# Patient Record
Sex: Female | Born: 1995 | Hispanic: No | Smoking: Current every day smoker
Health system: Southern US, Community
[De-identification: ages and names within clinical notes are randomized; demographics above are authoritative.]

## PROBLEM LIST (undated history)

## (undated) ENCOUNTER — Inpatient Hospital Stay (HOSPITAL_COMMUNITY): Payer: Self-pay

## (undated) ENCOUNTER — Inpatient Hospital Stay: Payer: Self-pay

## (undated) DIAGNOSIS — N390 Urinary tract infection, site not specified: Secondary | ICD-10-CM

## (undated) DIAGNOSIS — A749 Chlamydial infection, unspecified: Secondary | ICD-10-CM

## (undated) DIAGNOSIS — F32A Depression, unspecified: Secondary | ICD-10-CM

## (undated) DIAGNOSIS — F329 Major depressive disorder, single episode, unspecified: Secondary | ICD-10-CM

## (undated) DIAGNOSIS — F909 Attention-deficit hyperactivity disorder, unspecified type: Secondary | ICD-10-CM

## (undated) HISTORY — DX: Attention-deficit hyperactivity disorder, unspecified type: F90.9

## (undated) HISTORY — PX: TYMPANOSTOMY TUBE PLACEMENT: SHX32

## (undated) HISTORY — PX: WISDOM TOOTH EXTRACTION: SHX21

---
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
## 2017-11-04 ENCOUNTER — Ambulatory Visit (INDEPENDENT_AMBULATORY_CARE_PROVIDER_SITE_OTHER): Payer: Medicaid Other | Admitting: Certified Nurse Midwife

## 2017-11-04 ENCOUNTER — Other Ambulatory Visit (INDEPENDENT_AMBULATORY_CARE_PROVIDER_SITE_OTHER): Payer: Medicaid Other

## 2017-11-04 ENCOUNTER — Other Ambulatory Visit: Payer: Self-pay | Admitting: Certified Nurse Midwife

## 2017-11-04 ENCOUNTER — Encounter: Payer: Self-pay | Admitting: Certified Nurse Midwife

## 2017-11-04 VITALS — BP 116/72 | HR 96 | Ht 60.0 in | Wt 190.1 lb

## 2017-11-04 DIAGNOSIS — N8312 Corpus luteum cyst of left ovary: Secondary | ICD-10-CM

## 2017-11-04 DIAGNOSIS — N912 Amenorrhea, unspecified: Secondary | ICD-10-CM

## 2017-11-04 DIAGNOSIS — Z3201 Encounter for pregnancy test, result positive: Secondary | ICD-10-CM | POA: Diagnosis not present

## 2017-11-04 DIAGNOSIS — Z3A01 Less than 8 weeks gestation of pregnancy: Secondary | ICD-10-CM | POA: Diagnosis not present

## 2017-11-04 DIAGNOSIS — O3411 Maternal care for benign tumor of corpus uteri, first trimester: Secondary | ICD-10-CM | POA: Diagnosis not present

## 2017-11-04 DIAGNOSIS — N926 Irregular menstruation, unspecified: Secondary | ICD-10-CM

## 2017-11-04 LAB — POCT URINE PREGNANCY: Preg Test, Ur: POSITIVE — AB

## 2017-11-04 NOTE — Progress Notes (Signed)
Subjective:    Christina Warner is a 22 y.o. female who presents for evaluation of amenorrhea. She believes she could be pregnant. Pregnancy is desired. Sexual Activity: single partner, contraception: none. Current symptoms also include: nausea. Last period was normal.   Patient's last menstrual period was 09/21/2017 (exact date). The following portions of the patient's history were reviewed and updated as appropriate: allergies, current medications, past family history, past medical history, past social history, past surgical history and problem list.  Review of Systems Pertinent items are noted in HPI.     Objective:    BP 116/72   Pulse 96   Ht 5' (1.524 m)   Wt 190 lb 1 oz (86.2 kg)   LMP 09/21/2017 (Exact Date)   Breastfeeding? No   BMI 37.12 kg/m  General: alert, cooperative, appears stated age and no acute distress    Lab Review Urine HCG: positive    Assessment:    Absence of menstruation.     Plan:    Pregnancy Test: Positive. Briefly discussed pre-natal care options. Pregnancy, Childbirth and the Newborn care.MDvs midwifery care.  Encouraged well-balanced diet, plenty of rest when needed, pre-natal vitamins daily and walking for exercise. Discussed self-help for nausea, avoiding OTC medications until consulting provider or pharmacist, other than Tylenol as needed, minimal caffeine (1-2 cups daily) and avoiding alcohol. She will schedule her initial nurse OB visit at 10 wks and her NOV physical at 12 wks.  Christina Warner, CNM

## 2017-11-04 NOTE — Patient Instructions (Addendum)
Prenatal Care WHAT IS PRENATAL CARE? Prenatal care is the process of caring for a pregnant woman before she gives birth. Prenatal care makes sure that she and her baby remain as healthy as possible throughout pregnancy. Prenatal care may be provided by a midwife, family practice health care provider, or a childbirth and pregnancy specialist (obstetrician). Prenatal care may include physical examinations, testing, treatments, and education on nutrition, lifestyle, and social support services. WHY IS PRENATAL CARE SO IMPORTANT? Early and consistent prenatal care increases the chance that you and your baby will remain healthy throughout your pregnancy. This type of care also decreases a baby's risk of being born too early (prematurely), or being born smaller than expected (small for gestational age). Any underlying medical conditions you may have that could pose a risk during your pregnancy are discussed during prenatal care visits. You will also be monitored regularly for any new conditions that may arise during your pregnancy so they can be treated quickly and effectively. WHAT HAPPENS DURING PRENATAL CARE VISITS? Prenatal care visits may include the following: Discussion Tell your health care provider about any new signs or symptoms you have experienced since your last visit. These might include:  Nausea or vomiting.  Increased or decreased level of energy.  Difficulty sleeping.  Back or leg pain.  Weight changes.  Frequent urination.  Shortness of breath with physical activity.  Changes in your skin, such as the development of a rash or itchiness.  Vaginal discharge or bleeding.  Feelings of excitement or nervousness.  Changes in your baby's movements.  You may want to write down any questions or topics you want to discuss with your health care provider and bring them with you to your appointment. Examination During your first prenatal care visit, you will likely have a complete  physical exam. Your health care provider will often examine your vagina, cervix, and the position of your uterus, as well as check your heart, lungs, and other body systems. As your pregnancy progresses, your health care provider will measure the size of your uterus and your baby's position inside your uterus. He or she may also examine you for early signs of labor. Your prenatal visits may also include checking your blood pressure and, after about 10-12 weeks of pregnancy, listening to your baby's heartbeat. Testing Regular testing often includes:  Urinalysis. This checks your urine for glucose, protein, or signs of infection.  Blood count. This checks the levels of white and red blood cells in your body.  Tests for sexually transmitted infections (STIs). Testing for STIs at the beginning of pregnancy is routinely done and is required in many states.  Antibody testing. You will be checked to see if you are immune to certain illnesses, such as rubella, that can affect a developing fetus.  Glucose screen. Around 24-28 weeks of pregnancy, your blood glucose level will be checked for signs of gestational diabetes. Follow-up tests may be recommended.  Group B strep. This is a bacteria that is commonly found inside a woman's vagina. This test will inform your health care provider if you need an antibiotic to reduce the amount of this bacteria in your body prior to labor and childbirth.  Ultrasound. Many pregnant women undergo an ultrasound screening around 18-20 weeks of pregnancy to evaluate the health of the fetus and check for any developmental abnormalities.  HIV (human immunodeficiency virus) testing. Early in your pregnancy, you will be screened for HIV. If you are at high risk for HIV, this test may   be repeated during your third trimester of pregnancy.  You may be offered other testing based on your age, personal or family medical history, or other factors. HOW OFTEN SHOULD I PLAN TO SEE MY  HEALTH CARE PROVIDER FOR PRENATAL CARE? Your prenatal care check-up schedule depends on any medical conditions you have before, or develop during, your pregnancy. If you do not have any underlying medical conditions, you will likely be seen for checkups:  Monthly, during the first 6 months of pregnancy.  Twice a month during months 7 and 8 of pregnancy.  Weekly starting in the 9th month of pregnancy and until delivery.  If you develop signs of early labor or other concerning signs or symptoms, you may need to see your health care provider more often. Ask your health care provider what prenatal care schedule is best for you. WHAT CAN I DO TO KEEP MYSELF AND MY BABY AS HEALTHY AS POSSIBLE DURING MY PREGNANCY?  Take a prenatal vitamin containing 400 micrograms (0.4 mg) of folic acid every day. Your health care provider may also ask you to take additional vitamins such as iodine, vitamin D, iron, copper, and zinc.  Take 1500-2000 mg of calcium daily starting at your 20th week of pregnancy until you deliver your baby.  Make sure you are up to date on your vaccinations. Unless directed otherwise by your health care provider: ? You should receive a tetanus, diphtheria, and pertussis (Tdap) vaccination between the 27th and 36th week of your pregnancy, regardless of when your last Tdap immunization occurred. This helps protect your baby from whooping cough (pertussis) after he or she is born. ? You should receive an annual inactivated influenza vaccine (IIV) to help protect you and your baby from influenza. This can be done at any point during your pregnancy.  Eat a well-rounded diet that includes: ? Fresh fruits and vegetables. ? Lean proteins. ? Calcium-rich foods such as milk, yogurt, hard cheeses, and dark, leafy greens. ? Whole grain breads.  Do noteat seafood high in mercury, including: ? Swordfish. ? Tilefish. ? Shark. ? King mackerel. ? More than 6 oz tuna per week.  Do not  eat: ? Raw or undercooked meats or eggs. ? Unpasteurized foods, such as soft cheeses (brie, blue, or feta), juices, and milks. ? Lunch meats. ? Hot dogs that have not been heated until they are steaming.  Drink enough water to keep your urine clear or pale yellow. For many women, this may be 10 or more 8 oz glasses of water each day. Keeping yourself hydrated helps deliver nutrients to your baby and may prevent the start of pre-term uterine contractions.  Do not use any tobacco products including cigarettes, chewing tobacco, or electronic cigarettes. If you need help quitting, ask your health care provider.  Do not drink beverages containing alcohol. No safe level of alcohol consumption during pregnancy has been determined.  Do not use any illegal drugs. These can harm your developing baby or cause a miscarriage.  Ask your health care provider or pharmacist before taking any prescription or over-the-counter medicines, herbs, or supplements.  Limit your caffeine intake to no more than 200 mg per day.  Exercise. Unless told otherwise by your health care provider, try to get 30 minutes of moderate exercise most days of the week. Do not  do high-impact activities, contact sports, or activities with a high risk of falling, such as horseback riding or downhill skiing.  Get plenty of rest.  Avoid anything that raises your  body temperature, such as hot tubs and saunas.  If you own a cat, do not empty its litter box. Bacteria contained in cat feces can cause an infection called toxoplasmosis. This can result in serious harm to the fetus.  Stay away from chemicals such as insecticides, lead, mercury, and cleaning or paint products that contain solvents.  Do not have any X-rays taken unless medically necessary.  Take a childbirth and breastfeeding preparation class. Ask your health care provider if you need a referral or recommendation.  This information is not intended to replace advice given  to you by your health care provider. Make sure you discuss any questions you have with your health care provider. Document Released: 04/03/2003 Document Revised: 09/03/2015 Document Reviewed: 06/15/2013 Elsevier Interactive Patient Education  2017 [employer] Common Medications Safe in Pregnancy  Acne:      Constipation:  Benzoyl Peroxide     Colace  Clindamycin      Dulcolax Suppository  Topica Erythromycin     Fibercon  Salicylic Acid      Metamucil         Miralax AVOID:        Senakot   Accutane    Cough:  Retin-A       Cough Drops  Tetracycline      Phenergan w/ Codeine if Rx  Minocycline      Robitussin (Plain & DM)  Antibiotics:     Crabs/Lice:  Ceclor       RID  Cephalosporins    AVOID:  E-Mycins      Kwell  Keflex  Macrobid/Macrodantin   Diarrhea:  Penicillin      Kao-Pectate  Zithromax      Imodium AD         PUSH FLUIDS AVOID:       Cipro     Fever:  Tetracycline      Tylenol (Regular or Extra  Minocycline       Strength)  Levaquin      Extra Strength-Do not          Exceed 8 tabs/24 hrs Caffeine:        <200mg/day (equiv. To 1 cup of coffee or  approx. 3 12 oz sodas)         Gas: Cold/Hayfever:       Gas-X  Benadryl      Mylicon  Claritin       Phazyme  **Claritin-D        Chlor-Trimeton    Headaches:  Dimetapp      ASA-Free Excedrin  Drixoral-Non-Drowsy     Cold Compress  Mucinex (Guaifenasin)     Tylenol (Regular or Extra  Sudafed/Sudafed-12 Hour     Strength)  **Sudafed PE Pseudoephedrine   Tylenol Cold & Sinus     Vicks Vapor Rub  Zyrtec  **AVOID if Problems With Blood Pressure         Heartburn: Avoid lying down for at least 1 hour after meals  Aciphex      Maalox     Rash:  Milk of Magnesia     Benadryl    Mylanta       1% Hydrocortisone Cream  Pepcid  Pepcid Complete   Sleep Aids:  Prevacid      Ambien   Prilosec       Benadryl  Rolaids       Chamomile Tea  Tums (Limit 4/day)     Unisom  Zantac         Tylenol PM         Warm  milk-add vanilla or  Hemorrhoids:       Sugar for taste  Anusol/Anusol H.C.  (RX: Analapram 2.5%)  Sugar Substitutes:  Hydrocortisone OTC     Ok in moderation  Preparation H      Tucks        Vaseline lotion applied to tissue with wiping    Herpes:     Throat:  Acyclovir      Oragel  Famvir  Valtrex     Vaccines:         Flu Shot Leg Cramps:       *Gardasil  Benadryl      Hepatitis A         Hepatitis B Nasal Spray:       Pneumovax  Saline Nasal Spray     Polio Booster         Tetanus Nausea:       Tuberculosis test or PPD  Vitamin B6 25 mg TID   AVOID:    Dramamine      *Gardasil  Emetrol       Live Poliovirus  Ginger Root 250 mg QID    MMR (measles, mumps &  High Complex Carbs @ Bedtime    rebella)  Sea Bands-Accupressure    Varicella (Chickenpox)  Unisom 1/2 tab TID     *No known complications           If received before Pain:         Known pregnancy;   Darvocet       Resume series after  Lortab        Delivery  Percocet    Yeast:   Tramadol      Femstat  Tylenol 3      Gyne-lotrimin  Ultram       Monistat  Vicodin           MISC:         All Sunscreens           Hair Coloring/highlights          Insect Repellant's          (Including DEET)         Mystic Tans  

## 2017-11-04 NOTE — Progress Notes (Signed)
New pt is here for a confirmation of pregnancy. LMP 09/21/17. UPT positive. Pt had U/S to confirm dates. Unable to visualize. AT was called to hosp. Pt was instructed to make followup appointments- 4 weeks for intake and 6 weeks for NOB PE.

## 2017-11-16 ENCOUNTER — Encounter: Payer: Self-pay | Admitting: Certified Nurse Midwife

## 2017-11-30 ENCOUNTER — Ambulatory Visit (INDEPENDENT_AMBULATORY_CARE_PROVIDER_SITE_OTHER): Payer: Medicaid Other | Admitting: Certified Nurse Midwife

## 2017-11-30 VITALS — BP 106/67 | HR 87 | Ht 60.0 in | Wt 190.9 lb

## 2017-11-30 DIAGNOSIS — R638 Other symptoms and signs concerning food and fluid intake: Secondary | ICD-10-CM

## 2017-11-30 DIAGNOSIS — Z720 Tobacco use: Secondary | ICD-10-CM

## 2017-11-30 DIAGNOSIS — O219 Vomiting of pregnancy, unspecified: Secondary | ICD-10-CM

## 2017-11-30 DIAGNOSIS — K5900 Constipation, unspecified: Secondary | ICD-10-CM

## 2017-11-30 DIAGNOSIS — F17210 Nicotine dependence, cigarettes, uncomplicated: Secondary | ICD-10-CM

## 2017-11-30 DIAGNOSIS — T7589XA Other specified effects of external causes, initial encounter: Secondary | ICD-10-CM

## 2017-11-30 DIAGNOSIS — Z202 Contact with and (suspected) exposure to infections with a predominantly sexual mode of transmission: Secondary | ICD-10-CM

## 2017-11-30 DIAGNOSIS — Z3401 Encounter for supervision of normal first pregnancy, first trimester: Secondary | ICD-10-CM

## 2017-11-30 MED ORDER — DOXYLAMINE-PYRIDOXINE 10-10 MG PO TBEC: 10.0000 mg | DELAYED_RELEASE_TABLET | Freq: Every day | ORAL | 1 refills | Status: DC

## 2017-11-30 NOTE — Progress Notes (Signed)
Oren Sectiononi N Elison presents for NOB nurse interview visit. Pregnancy confirmation done _7/24/2019 with AT.  G2- .  P1001-.  LMP-09/21/2017( exact) EDD- 06/28/2018 - 10 weeks. Pregnancy education material explained and given. Pos for  cats in the home. Advised to not change the litter box. Toxo ordered. NOB labs ordered. TSH/HbgA1c ordered due to Increased BMI-Body mass index is 37.28 kg/m.   HIV labs and Drug screen were explained and ordered.   PNV encouraged.  Pos for n/v. Diclegis rx given. Pos for tobacco use. Advised  against smoking.  Pt c/o of constipation. Encouraged 64oz of water qd. Stool softeners bid. FMLA formed signed. Financial policy reviewed.   Genetic screening options discussed. Genetic testing: Unsure.  Pt may discuss with provider. Pt. To follow up with provider in _2_ weeks for NOB physical.  All questions answered.

## 2017-12-01 ENCOUNTER — Other Ambulatory Visit: Payer: Self-pay

## 2017-12-01 LAB — DRUG PROFILE, UR, 9 DRUGS (LABCORP)
Amphetamines, Urine: NEGATIVE ng/mL
Barbiturate Quant, Ur: NEGATIVE ng/mL
Benzodiazepine Quant, Ur: NEGATIVE ng/mL
CANNABINOID QUANT UR: NEGATIVE ng/mL
Cocaine (Metab.): NEGATIVE ng/mL
METHADONE SCREEN, URINE: NEGATIVE ng/mL
Opiate Quant, Ur: NEGATIVE ng/mL
PCP QUANT UR: NEGATIVE ng/mL
Propoxyphene: NEGATIVE ng/mL

## 2017-12-01 LAB — NICOTINE SCREEN, URINE: Cotinine Ql Scrn, Ur: POSITIVE ng/mL — AB

## 2017-12-01 LAB — URINALYSIS, ROUTINE W REFLEX MICROSCOPIC
Bilirubin, UA: NEGATIVE
GLUCOSE, UA: NEGATIVE
Nitrite, UA: NEGATIVE
RBC, UA: NEGATIVE
Specific Gravity, UA: 1.028 (ref 1.005–1.030)
Urobilinogen, Ur: 1 mg/dL (ref 0.2–1.0)
pH, UA: 6 (ref 5.0–7.5)

## 2017-12-01 LAB — MICROSCOPIC EXAMINATION: Casts: NONE SEEN /lpf

## 2017-12-01 MED ORDER — DOXYLAMINE-PYRIDOXINE 10-10 MG PO TBEC: 10.0000 mg | DELAYED_RELEASE_TABLET | Freq: Three times a day (TID) | ORAL | 1 refills | Status: DC

## 2017-12-02 ENCOUNTER — Other Ambulatory Visit: Payer: Self-pay

## 2017-12-02 LAB — URINE CULTURE, OB REFLEX

## 2017-12-02 LAB — GC/CHLAMYDIA PROBE AMP
Chlamydia trachomatis, NAA: NEGATIVE
NEISSERIA GONORRHOEAE BY PCR: NEGATIVE

## 2017-12-02 LAB — CULTURE, OB URINE

## 2017-12-02 MED ORDER — DOXYLAMINE-PYRIDOXINE 10-10 MG PO TBEC: 10.0000 mg | DELAYED_RELEASE_TABLET | Freq: Three times a day (TID) | ORAL | 1 refills | Status: DC

## 2017-12-05 LAB — TOXOPLASMA ANTIBODIES- IGG AND  IGM
TOXOPLASMA IGG RATIO: 249 [IU]/mL — AB (ref 0.0–7.1)
Toxoplasma Antibody- IgM: <3 AU/mL (ref 0.0–7.9)

## 2017-12-05 LAB — CBC WITH DIFFERENTIAL/PLATELET
BASOS ABS: 0 10*3/uL (ref 0.0–0.2)
Basos: 0 %
EOS (ABSOLUTE): 0.1 10*3/uL (ref 0.0–0.4)
Eos: 1 %
Hematocrit: 37.4 % (ref 34.0–46.6)
Hemoglobin: 12.4 g/dL (ref 11.1–15.9)
Immature Grans (Abs): 0 10*3/uL (ref 0.0–0.1)
Immature Granulocytes: 0 %
Lymphocytes Absolute: 2.6 10*3/uL (ref 0.7–3.1)
Lymphs: 29 %
MCH: 29.4 pg (ref 26.6–33.0)
MCHC: 33.2 g/dL (ref 31.5–35.7)
MCV: 89 fL (ref 79–97)
Monocytes Absolute: 0.4 10*3/uL (ref 0.1–0.9)
Monocytes: 5 %
NEUTROS ABS: 5.8 10*3/uL (ref 1.4–7.0)
Neutrophils: 65 %
PLATELETS: 243 10*3/uL (ref 150–450)
RBC: 4.22 x10E6/uL (ref 3.77–5.28)
RDW: 12.1 % — ABNORMAL LOW (ref 12.3–15.4)
WBC: 9 10*3/uL (ref 3.4–10.8)

## 2017-12-05 LAB — ABO AND RH: RH TYPE: POSITIVE

## 2017-12-05 LAB — HEMOGLOBIN A1C
ESTIMATED AVERAGE GLUCOSE: 100 mg/dL
Hgb A1c MFr Bld: 5.1 % (ref 4.8–5.6)

## 2017-12-05 LAB — RPR: RPR Ser Ql: NONREACTIVE

## 2017-12-05 LAB — HIV ANTIBODY (ROUTINE TESTING W REFLEX): HIV Screen 4th Generation wRfx: NONREACTIVE

## 2017-12-05 LAB — RUBELLA SCREEN: Rubella Antibodies, IGG: 10 index (ref >0.99)

## 2017-12-05 LAB — TSH: TSH: 0.987 u[IU]/mL (ref 0.450–4.500)

## 2017-12-05 LAB — HEPATITIS B SURFACE ANTIGEN: HEP B S AG: NEGATIVE

## 2017-12-05 LAB — ANTIBODY SCREEN: ANTIBODY SCREEN: NEGATIVE

## 2017-12-05 LAB — VARICELLA ZOSTER ANTIBODY, IGG: Varicella zoster IgG: 816 index (ref >165)

## 2017-12-15 ENCOUNTER — Ambulatory Visit (INDEPENDENT_AMBULATORY_CARE_PROVIDER_SITE_OTHER): Payer: Self-pay | Admitting: Certified Nurse Midwife

## 2017-12-15 ENCOUNTER — Encounter: Payer: Self-pay | Admitting: Certified Nurse Midwife

## 2017-12-15 VITALS — BP 102/70 | HR 88 | Wt 187.5 lb

## 2017-12-15 DIAGNOSIS — O09291 Supervision of pregnancy with other poor reproductive or obstetric history, first trimester: Secondary | ICD-10-CM

## 2017-12-15 DIAGNOSIS — O09299 Supervision of pregnancy with other poor reproductive or obstetric history, unspecified trimester: Secondary | ICD-10-CM | POA: Insufficient documentation

## 2017-12-15 DIAGNOSIS — Z3A12 12 weeks gestation of pregnancy: Secondary | ICD-10-CM

## 2017-12-15 DIAGNOSIS — Z3401 Encounter for supervision of normal first pregnancy, first trimester: Secondary | ICD-10-CM

## 2017-12-15 LAB — POCT URINALYSIS DIPSTICK OB
Bilirubin, UA: NEGATIVE
GLUCOSE, UA: NEGATIVE
Ketones, UA: NEGATIVE
LEUKOCYTES UA: NEGATIVE
Nitrite, UA: NEGATIVE
POC,PROTEIN,UA: NEGATIVE
RBC UA: NEGATIVE
Spec Grav, UA: 1.025 (ref 1.010–1.025)
Urobilinogen, UA: 0.2 E.U./dL
pH, UA: 5 (ref 5.0–8.0)

## 2017-12-15 NOTE — Progress Notes (Signed)
NEW OB HISTORY AND PHYSICAL  SUBJECTIVE:       Christina Warner is a 22 y.o. G36P1001 female, Patient's last menstrual period was 09/21/2017 (exact date)., Estimated Date of Delivery: 06/28/18, [redacted]w[redacted]d, presents today for establishment of Prenatal Care. She has nausea and vomiting that the bonjesta is helping with.  History significant for forceps delivery of 1st baby.    Gynecologic History Patient's last menstrual period was 09/21/2017 (exact date). Normal Contraception: none Last Pap: n/a 22 yr old.   Obstetric History OB History  Gravida Para Term Preterm AB Living  2 1 1     1   SAB TAB Ectopic Multiple Live Births        0 1    # Outcome Date GA Lbr Len/2nd Weight Sex Delivery Anes PTL Lv  2 Current           1 Term 06/01/14 [redacted]w[redacted]d  2.98 kg F Vag-Spont EPI  LIV     Birth Comments: WNL    Past Medical History:  Diagnosis Date  . ADHD (attention deficit hyperactivity disorder)   . Asthma   . Depression     Past Surgical History:  Procedure Laterality Date  . TYMPANOSTOMY TUBE PLACEMENT      Current Outpatient Medications on File Prior to Visit  Medication Sig Dispense Refill  . Doxylamine-Pyridoxine 10-10 MG TBEC Take 10 mg by mouth 3 (three) times daily. PRN. Max dose 4 tabs per day 60 tablet 1  . Prenatal Vit-Fe Fumarate-FA (PRENATAL MULTIVITAMIN) TABS tablet Take 1 tablet by mouth daily at 12 noon.     Current Facility-Administered Medications on File Prior to Visit  Medication Dose Route Frequency Provider Last Rate Last Dose  . lidocaine (PF) (XYLOCAINE) 1 % injection    Anesthesia Intra-op Cristela Blue, MD   6 mL at 05/31/14 2144    Allergies  Allergen Reactions  . Motrin [Ibuprofen]     Social History   Socioeconomic History  . Marital status: Single    Spouse name: Not on file  . Number of children: Not on file  . Years of education: Not on file  . Highest education level: Not on file  Occupational History  . Not on file  Social Needs  . Financial  resource strain: Not on file  . Food insecurity:    Worry: Not on file    Inability: Not on file  . Transportation needs:    Medical: Not on file    Non-medical: Not on file  Tobacco Use  . Smoking status: Current Every Day Smoker    Packs/day: 0.25    Types: Cigarettes  . Smokeless tobacco: Never Used  Substance and Sexual Activity  . Alcohol use: No  . Drug use: No    Types: Marijuana  . Sexual activity: Yes    Birth control/protection: None  Lifestyle  . Physical activity:    Days per week: Not on file    Minutes per session: Not on file  . Stress: Not on file  Relationships  . Social connections:    Talks on phone: Not on file    Gets together: Not on file    Attends religious service: Not on file    Active member of club or organization: Not on file    Attends meetings of clubs or organizations: Not on file    Relationship status: Not on file  . Intimate partner violence:    Fear of current or ex partner: Not on file  Emotionally abused: Not on file    Physically abused: Not on file    Forced sexual activity: Not on file  Other Topics Concern  . Not on file  Social History Narrative  . Not on file    Family History  Problem Relation Age of Onset  . Depression Mother   . Anxiety disorder Mother   . Depression Sister     The following portions of the patient's history were reviewed and updated as appropriate: allergies, current medications, past OB history, past medical history, past surgical history, past family history, past social history, and problem list.    OBJECTIVE: Initial Physical Exam (New OB)  GENERAL APPEARANCE: alert, well appearing, in no apparent distress, oriented to person, place and time, overweight HEAD: normocephalic, atraumatic MOUTH: mucous membranes moist, pharynx normal without lesions THYROID: no thyromegaly or masses present BREASTS: no masses noted, no significant tenderness, no palpable axillary nodes, no skin  changes LUNGS: clear to auscultation, no wheezes, rales or rhonchi, symmetric air entry HEART: regular rate and rhythm, no murmurs ABDOMEN: soft, nontender, nondistended, no abnormal masses, no epigastric pain and FHT present EXTREMITIES: no redness or tenderness in the calves or thighs, no edema, no limitation in range of motion, intact peripheral pulses SKIN: normal coloration and turgor, no rashes LYMPH NODES: no adenopathy palpable NEUROLOGIC: alert, oriented, normal speech, no focal findings or movement disorder noted  PELVIC EXAM EXTERNAL GENITALIA: normal appearing vulva with no masses, tenderness or lesions VAGINA: no abnormal discharge or lesions CERVIX: no lesions or cervical motion tenderness UTERUS: gravid ADNEXA: no masses palpable and nontender OB EXAM PELVIMETRY: appears adequate RECTUM: exam not indicated  ASSESSMENT: Normal pregnancy  PLAN: New OB counseling: The patient has been given an overview regarding routine prenatal care. Recommendations regarding diet, weight gain, and exercise in pregnancy were given. Prenatal testing, optional genetic testing, and ultrasound use in pregnancy were reviewed. Declines genetic testing.Encouraged patient to stop smoking. Benefits of Breast Feeding were discussed. The patient is encouraged to consider nursing her baby post partum.    Doreene Burke, CNM

## 2017-12-15 NOTE — Patient Instructions (Signed)

## 2017-12-17 ENCOUNTER — Other Ambulatory Visit: Payer: Self-pay

## 2017-12-17 MED ORDER — DICLEGIS 10-10 MG PO TBEC: 2.0000 | DELAYED_RELEASE_TABLET | Freq: Every day | ORAL | 5 refills | Status: DC

## 2017-12-23 ENCOUNTER — Telehealth: Payer: Self-pay | Admitting: Obstetrics and Gynecology

## 2017-12-23 ENCOUNTER — Other Ambulatory Visit: Payer: Self-pay | Admitting: *Deleted

## 2017-12-23 MED ORDER — DICLEGIS 10-10 MG PO TBEC: 2.0000 | DELAYED_RELEASE_TABLET | Freq: Three times a day (TID) | ORAL | 5 refills | Status: DC

## 2017-12-23 NOTE — Telephone Encounter (Signed)
Sent pt in diclegis, DAW, they should pay for it

## 2017-12-23 NOTE — Telephone Encounter (Signed)
The patient is requesting something different for her nausea due to the one she is on now being $400, and she is also wanting to change her pharmacy [facility] [place] on [address].  Please advise, thanks.

## 2017-12-24 ENCOUNTER — Other Ambulatory Visit: Payer: Self-pay

## 2017-12-24 MED ORDER — DICLEGIS 10-10 MG PO TBEC: 1.0000 | DELAYED_RELEASE_TABLET | Freq: Three times a day (TID) | ORAL | 5 refills | Status: DC

## 2017-12-30 NOTE — Telephone Encounter (Signed)
CM spoke with patient after being informed by [employer] that patient's Medicaid [facility].  CM encouraged patient that her Rx for Diclegis will not be filled due to her current Medicaid status.  CM encouraged patient to contact [place] DSS [place] and notify them of her pregnancy with an estimated due date.  Patient agreeable and confirmed with cm that she will notify her DSS worker of her pregnancy and estimated due date.  CM encouraged pt to contact our office when she has completed the transaction with her DSS worker so that her Rx for Diclegis can be resubmitted to a pharmacy of her choice.  Notified cm that preferred pharmacy is [facility] in [place], [place] on [address].

## 2018-01-12 ENCOUNTER — Encounter: Payer: Self-pay | Admitting: Obstetrics and Gynecology

## 2018-01-12 ENCOUNTER — Ambulatory Visit (INDEPENDENT_AMBULATORY_CARE_PROVIDER_SITE_OTHER): Payer: Medicaid Other | Admitting: Obstetrics and Gynecology

## 2018-01-12 VITALS — BP 102/65 | HR 80 | Wt 186.5 lb

## 2018-01-12 DIAGNOSIS — Z3A16 16 weeks gestation of pregnancy: Secondary | ICD-10-CM

## 2018-01-12 DIAGNOSIS — Z23 Encounter for immunization: Secondary | ICD-10-CM | POA: Diagnosis not present

## 2018-01-12 DIAGNOSIS — Z3492 Encounter for supervision of normal pregnancy, unspecified, second trimester: Secondary | ICD-10-CM

## 2018-01-12 DIAGNOSIS — Z3482 Encounter for supervision of other normal pregnancy, second trimester: Secondary | ICD-10-CM | POA: Diagnosis not present

## 2018-01-12 LAB — POCT URINALYSIS DIPSTICK OB
BILIRUBIN UA: NEGATIVE
Blood, UA: NEGATIVE
KETONES UA: NEGATIVE
Nitrite, UA: NEGATIVE
Spec Grav, UA: 1.015 (ref 1.010–1.025)
UROBILINOGEN UA: 0.2 U/dL
pH, UA: 7 (ref 5.0–8.0)

## 2018-01-12 NOTE — Progress Notes (Signed)
ROB- flu vaccine given,PMH form fillied out, will do early glucola with anatomy scan. Stopped smoking x 3 weeks ago.

## 2018-01-12 NOTE — Progress Notes (Signed)
ROB- pt is doing well, Rash on R foot, flu vaccine given

## 2018-01-28 ENCOUNTER — Other Ambulatory Visit: Payer: Self-pay | Admitting: Obstetrics and Gynecology

## 2018-01-28 DIAGNOSIS — Z3689 Encounter for other specified antenatal screening: Secondary | ICD-10-CM

## 2018-02-11 ENCOUNTER — Ambulatory Visit (INDEPENDENT_AMBULATORY_CARE_PROVIDER_SITE_OTHER): Payer: Medicaid Other | Admitting: Certified Nurse Midwife

## 2018-02-11 ENCOUNTER — Ambulatory Visit: Payer: Medicaid Other

## 2018-02-11 ENCOUNTER — Ambulatory Visit (INDEPENDENT_AMBULATORY_CARE_PROVIDER_SITE_OTHER): Payer: Medicaid Other

## 2018-02-11 VITALS — BP 106/71 | HR 96 | Wt 189.8 lb

## 2018-02-11 DIAGNOSIS — O99212 Obesity complicating pregnancy, second trimester: Secondary | ICD-10-CM

## 2018-02-11 DIAGNOSIS — Z3689 Encounter for other specified antenatal screening: Secondary | ICD-10-CM | POA: Diagnosis not present

## 2018-02-11 DIAGNOSIS — Z3A18 18 weeks gestation of pregnancy: Secondary | ICD-10-CM

## 2018-02-11 DIAGNOSIS — Z3492 Encounter for supervision of normal pregnancy, unspecified, second trimester: Secondary | ICD-10-CM

## 2018-02-11 DIAGNOSIS — O99210 Obesity complicating pregnancy, unspecified trimester: Secondary | ICD-10-CM | POA: Insufficient documentation

## 2018-02-11 LAB — POCT URINALYSIS DIPSTICK OB
Bilirubin, UA: NEGATIVE
LEUKOCYTES UA: NEGATIVE
NITRITE UA: NEGATIVE
POC,PROTEIN,UA: NEGATIVE
RBC UA: NEGATIVE
SPEC GRAV UA: 1.01 (ref 1.010–1.025)
Urobilinogen, UA: 0.2 E.U./dL
pH, UA: 5 (ref 5.0–8.0)

## 2018-02-11 NOTE — Progress Notes (Signed)
ROB, no complaints.  

## 2018-02-11 NOTE — Patient Instructions (Signed)
Common Medications Safe in Pregnancy  Acne:      Constipation:  Benzoyl Peroxide     Colace  Clindamycin      Dulcolax Suppository  Topica Erythromycin     Fibercon  Salicylic Acid      Metamucil         Miralax AVOID:        Senakot   Accutane    Cough:  Retin-A       Cough Drops  Tetracycline      Phenergan w/ Codeine if Rx  Minocycline      Robitussin (Plain & DM)  Antibiotics:     Crabs/Lice:  Ceclor       RID  Cephalosporins    AVOID:  E-Mycins      Kwell  Keflex  Macrobid/Macrodantin   Diarrhea:  Penicillin      Kao-Pectate  Zithromax      Imodium AD         PUSH FLUIDS AVOID:       Cipro     Fever:  Tetracycline      Tylenol (Regular or Extra  Minocycline       Strength)  Levaquin      Extra Strength-Do not          Exceed 8 tabs/24 hrs Caffeine:        <200mg/day (equiv. To 1 cup of coffee or  approx. 3 12 oz sodas)         Gas: Cold/Hayfever:       Gas-X  Benadryl      Mylicon  Claritin       Phazyme  **Claritin-D        Chlor-Trimeton    Headaches:  Dimetapp      ASA-Free Excedrin  Drixoral-Non-Drowsy     Cold Compress  Mucinex (Guaifenasin)     Tylenol (Regular or Extra  Sudafed/Sudafed-12 Hour     Strength)  **Sudafed PE Pseudoephedrine   Tylenol Cold & Sinus     Vicks Vapor Rub  Zyrtec  **AVOID if Problems With Blood Pressure         Heartburn: Avoid lying down for at least 1 hour after meals  Aciphex      Maalox     Rash:  Milk of Magnesia     Benadryl    Mylanta       1% Hydrocortisone Cream  Pepcid  Pepcid Complete   Sleep Aids:  Prevacid      Ambien   Prilosec       Benadryl  Rolaids       Chamomile Tea  Tums (Limit 4/day)     Unisom  Zantac       Tylenol PM         Warm milk-add vanilla or  Hemorrhoids:       Sugar for taste  Anusol/Anusol H.C.  (RX: Analapram 2.5%)  Sugar Substitutes:  Hydrocortisone OTC     Ok in moderation  Preparation H      Tucks        Vaseline lotion applied to tissue with  wiping    Herpes:     Throat:  Acyclovir      Oragel  Famvir  Valtrex     Vaccines:         Flu Shot Leg Cramps:       *Gardasil  Benadryl      Hepatitis A         Hepatitis B Nasal Spray:         Pneumovax  Saline Nasal Spray     Polio Booster         Tetanus Nausea:       Tuberculosis test or PPD  Vitamin B6 25 mg TID   AVOID:    Dramamine      *Gardasil  Emetrol       Live Poliovirus  Ginger Root 250 mg QID    MMR (measles, mumps &  High Complex Carbs @ Bedtime    rebella)  Sea Bands-Accupressure    Varicella (Chickenpox)  Unisom 1/2 tab TID     *No known complications           If received before Pain:         Known pregnancy;   Darvocet       Resume series after  Lortab        Delivery  Percocet    Yeast:   Tramadol      Femstat  Tylenol 3      Gyne-lotrimin  Ultram       Monistat  Vicodin           MISC:         All Sunscreens           Hair Coloring/highlights          Insect Repellant's          (Including DEET)         Mystic Tans Second Trimester of Pregnancy The second trimester is from week 13 through week 28, month 4 through 6. This is often the time in pregnancy that you feel your best. Often times, morning sickness has lessened or quit. You may have more energy, and you may get hungry more often. Your unborn baby (fetus) is growing rapidly. At the end of the sixth month, he or she is about 9 inches long and weighs about 1 pounds. You will likely feel the baby move (quickening) between 18 and 20 weeks of pregnancy. Follow these instructions at home:  Avoid all smoking, herbs, and alcohol. Avoid drugs not approved by your doctor.  Do not use any tobacco products, including cigarettes, chewing tobacco, and electronic cigarettes. If you need help quitting, ask your doctor. You may get counseling or other support to help you quit.  Only take medicine as told by your doctor. Some medicines are safe and some are not during pregnancy.  Exercise only as told by your  doctor. Stop exercising if you start having cramps.  Eat regular, healthy meals.  Wear a good support bra if your breasts are tender.  Do not use hot tubs, steam rooms, or saunas.  Wear your seat belt when driving.  Avoid raw meat, uncooked cheese, and liter boxes and soil used by cats.  Take your prenatal vitamins.  Take 1500-2000 milligrams of calcium daily starting at the 20th week of pregnancy until you deliver your baby.  Try taking medicine that helps you poop (stool softener) as needed, and if your doctor approves. Eat more fiber by eating fresh fruit, vegetables, and whole grains. Drink enough fluids to keep your pee (urine) clear or pale yellow.  Take warm water baths (sitz baths) to soothe pain or discomfort caused by hemorrhoids. Use hemorrhoid cream if your doctor approves.  If you have puffy, bulging veins (varicose veins), wear support hose. Raise (elevate) your feet for 15 minutes, 3-4 times a day. Limit salt in your diet.  Avoid heavy lifting, wear low heals, and sit up straight.    Rest with your legs raised if you have leg cramps or low back pain.  Visit your dentist if you have not gone during your pregnancy. Use a soft toothbrush to brush your teeth. Be gentle when you floss.  You can have sex (intercourse) unless your doctor tells you not to.  Go to your doctor visits. Get help if:  You feel dizzy.  You have mild cramps or pressure in your lower belly (abdomen).  You have a nagging pain in your belly area.  You continue to feel sick to your stomach (nauseous), throw up (vomit), or have watery poop (diarrhea).  You have bad smelling fluid coming from your vagina.  You have pain with peeing (urination). Get help right away if:  You have a fever.  You are leaking fluid from your vagina.  You have spotting or bleeding from your vagina.  You have severe belly cramping or pain.  You lose or gain weight rapidly.  You have trouble catching your  breath and have chest pain.  You notice sudden or extreme puffiness (swelling) of your face, hands, ankles, feet, or legs.  You have not felt the baby move in over an hour.  You have severe headaches that do not go away with medicine.  You have vision changes. This information is not intended to replace advice given to you by your health care provider. Make sure you discuss any questions you have with your health care provider. Document Released: 06/25/2009 Document Revised: 09/06/2015 Document Reviewed: 06/01/2012 Elsevier Interactive Patient Education  2017 [employer]. Back Pain in Pregnancy Back pain during pregnancy is common. Back pain may be caused by several factors that are related to changes during your pregnancy. Follow these instructions at home: Managing pain, stiffness, and swelling  If directed, apply ice for sudden (acute) back pain. ? Put ice in a plastic bag. ? Place a towel between your skin and the bag. ? Leave the ice on for 20 minutes, 2-3 times per day.  If directed, apply heat to the affected area before you exercise: ? Place a towel between your skin and the heat pack or heating pad. ? Leave the heat on for 20-30 minutes. ? Remove the heat if your skin turns bright red. This is especially important if you are unable to feel pain, heat, or cold. You may have a greater risk of getting burned. Activity  Exercise as told by your health care provider. Exercising is the best way to prevent or manage back pain.  Listen to your body when lifting. If lifting hurts, ask for help or bend your knees. This uses your leg muscles instead of your back muscles.  Squat down when picking up something from the floor. Do not bend over.  Only use bed rest as told by your health care provider. Bed rest should only be used for the most severe episodes of back pain. Standing, Sitting, and Lying Down  Do not stand in one place for long periods of time.  Use good posture when  sitting. Make sure your head rests over your shoulders and is not hanging forward. Use a pillow on your lower back if necessary.  Try sleeping on your side, preferably the left side, with a pillow or two between your legs. If you are sore after a night's rest, your bed may be too soft. A firm mattress may provide more support for your back during pregnancy. General instructions  Do not wear high heels.  Eat a healthy diet.  Try to gain weight within your health care provider's recommendations.  Use a maternity girdle, elastic sling, or back brace as told by your health care provider.  Take over-the-counter and prescription medicines only as told by your health care provider.  Keep all follow-up visits as told by your health care provider. This is important. This includes any visits with any specialists, such as a physical therapist. Contact a health care provider if:  Your back pain interferes with your daily activities.  You have increasing pain in other parts of your body. Get help right away if:  You develop numbness, tingling, weakness, or problems with the use of your arms or legs.  You develop severe back pain that is not controlled with medicine.  You have a sudden change in bowel or bladder control.  You develop shortness of breath, dizziness, or you faint.  You develop nausea, vomiting, or sweating.  You have back pain that is a rhythmic, cramping pain similar to labor pains. Labor pain is usually 1-2 minutes apart, lasts for about 1 minute, and involves a bearing down feeling or pressure in your pelvis.  You have back pain and your water breaks or you have vaginal bleeding.  You have back pain or numbness that travels down your leg.  Your back pain developed after you fell.  You develop pain on one side of your back.  You see blood in your urine.  You develop skin blisters in the area of your back pain. This information is not intended to replace advice given to  you by your health care provider. Make sure you discuss any questions you have with your health care provider. Document Released: 07/09/2005 Document Revised: 09/06/2015 Document Reviewed: 12/13/2014 Elsevier Interactive Patient Education  2018 [employer]. Round Ligament Pain The round ligament is a cord of muscle and tissue that helps to support the uterus. It can become a source of pain during pregnancy if it becomes stretched or twisted as the baby grows. The pain usually begins in the second trimester of pregnancy, and it can come and go until the baby is delivered. It is not a serious problem, and it does not cause harm to the baby. Round ligament pain is usually a short, sharp, and pinching pain, but it can also be a dull, lingering, and aching pain. The pain is felt in the lower side of the abdomen or in the groin. It usually starts deep in the groin and moves up to the outside of the hip area. Pain can occur with:  A sudden change in position.  Rolling over in bed.  Coughing or sneezing.  Physical activity.  Follow these instructions at home: Watch your condition for any changes. Take these steps to help with your pain:  When the pain starts, relax. Then try: ? Sitting down. ? Flexing your knees up to your abdomen. ? Lying on your side with one pillow under your abdomen and another pillow between your legs. ? Sitting in a warm bath for 15-20 minutes or until the pain goes away.  Take over-the-counter and prescription medicines only as told by your health care provider.  Move slowly when you sit and stand.  Avoid long walks if they cause pain.  Stop or lessen your physical activities if they cause pain.  Contact a health care provider if:  Your pain does not go away with treatment.  You feel pain in your back that you did not have before.  Your medicine is not helping.  Get help right away if:  You develop a fever or chills.  You develop uterine  contractions.  You develop vaginal bleeding.  You develop nausea or vomiting.  You develop diarrhea.  You have pain when you urinate. This information is not intended to replace advice given to you by your health care provider. Make sure you discuss any questions you have with your health care provider. Document Released: 01/08/2008 Document Revised: 09/06/2015 Document Reviewed: 06/07/2014 Elsevier Interactive Patient Education  [employer].

## 2018-02-11 NOTE — Progress Notes (Signed)
ROB-Doing well. Early glucola today for BMI, call patient with results; okay to leave message. Anatomy scan reviewed with patient, verbalized understanding. EDC updated to 07/12/2017. Anticipatory guidance regarding course of prenatal care. Reviewed red flag symptoms and when to call. RTC x 4 weeks for ROB or sooner if needed.   ULTRASOUND REPORT  Location: [facility] Date of Service:  02/11/2018  Indications: Anatomy Findings:  Singleton intrauterine pregnancy is visualized with FHR at 153 BPM. Biometrics give an (U/S) Gestational age of 53 2/7 weeks and an (U/S) EDD of 07/13/18; this DOES NOT correlate with the clinically established EDD of 06/28/18.  Fetal presentation is breech.  EFW: 236 grams (0lb 8oz). Placenta: Posterior and grade 1. AFI: WNL subjectively.  Anatomic survey is complete and appears WNL; Gender - [place].   Right Ovary measures 2.9 x 1.9 x 1.7 cm. It is normal in appearance. Left Ovary measures 3.3 x 2.6 x 1.6 cm. It is normal appearance. There is no obvious evidence of a corpus luteal cyst. Survey of the adnexa demonstrates no adnexal masses. There is no free peritoneal fluid in the cul de sac.  Impression: 1. 18 2/7 week Viable Singleton Intrauterine pregnancy by U/S. 2. (U/S) EDD IS NOT consistent with Clinically established (LMP) EDD of 06/28/18. 3. Normal Anatomy Scan  Recommendations: 1.Clinical correlation with the patient's History and Physical Exam. 2. EDD should be adjusted to match today's ultrasound of 07/13/18.

## 2018-02-12 LAB — GLUCOSE TOLERANCE, 1 HOUR: Glucose, 1Hr PP: 97 mg/dL (ref 65–199)

## 2018-02-18 ENCOUNTER — Other Ambulatory Visit: Payer: Self-pay | Admitting: Obstetrics and Gynecology

## 2018-03-15 ENCOUNTER — Ambulatory Visit (INDEPENDENT_AMBULATORY_CARE_PROVIDER_SITE_OTHER): Payer: Medicaid Other | Admitting: Certified Nurse Midwife

## 2018-03-15 ENCOUNTER — Encounter: Payer: Self-pay | Admitting: Certified Nurse Midwife

## 2018-03-15 VITALS — BP 89/56 | HR 84 | Wt 193.4 lb

## 2018-03-15 DIAGNOSIS — Z3492 Encounter for supervision of normal pregnancy, unspecified, second trimester: Secondary | ICD-10-CM | POA: Diagnosis not present

## 2018-03-15 LAB — POCT URINALYSIS DIPSTICK OB
BILIRUBIN UA: NEGATIVE
Blood, UA: NEGATIVE
GLUCOSE, UA: NEGATIVE
KETONES UA: NEGATIVE
Leukocytes, UA: NEGATIVE
Nitrite, UA: NEGATIVE
PH UA: 5 (ref 5.0–8.0)
POC,PROTEIN,UA: NEGATIVE
Spec Grav, UA: 1.025 (ref 1.010–1.025)
UROBILINOGEN UA: 0.2 U/dL

## 2018-03-15 NOTE — Patient Instructions (Signed)

## 2018-03-15 NOTE — Progress Notes (Signed)
Christina Warner, doing well. Feels good movement. Discussed repeat glucose screen at 28 wks. Pt verbalizes and agrees to plan . Follow up 4 wks.   Christina BurkeAnnie Jaziah Warner, CNM

## 2018-04-05 ENCOUNTER — Telehealth: Payer: Self-pay | Admitting: Certified Nurse Midwife

## 2018-04-05 ENCOUNTER — Observation Stay
Admission: EM | Admit: 2018-04-05 | Discharge: 2018-04-05 | Disposition: A | Discharge status: Home / Self Care | Payer: Medicaid Other | Attending: Certified Nurse Midwife | Admitting: Certified Nurse Midwife

## 2018-04-05 ENCOUNTER — Other Ambulatory Visit: Payer: Self-pay

## 2018-04-05 DIAGNOSIS — Z3A25 25 weeks gestation of pregnancy: Secondary | ICD-10-CM

## 2018-04-05 DIAGNOSIS — Z3A26 26 weeks gestation of pregnancy: Secondary | ICD-10-CM | POA: Diagnosis not present

## 2018-04-05 DIAGNOSIS — R1032 Left lower quadrant pain: Secondary | ICD-10-CM | POA: Diagnosis present

## 2018-04-05 DIAGNOSIS — O26892 Other specified pregnancy related conditions, second trimester: Secondary | ICD-10-CM | POA: Diagnosis not present

## 2018-04-05 DIAGNOSIS — O26893 Other specified pregnancy related conditions, third trimester: Secondary | ICD-10-CM

## 2018-04-05 DIAGNOSIS — R109 Unspecified abdominal pain: Secondary | ICD-10-CM

## 2018-04-05 LAB — URINALYSIS, ROUTINE W REFLEX MICROSCOPIC
BILIRUBIN URINE: NEGATIVE
Glucose, UA: NEGATIVE mg/dL
Hgb urine dipstick: NEGATIVE
Ketones, ur: NEGATIVE mg/dL
Nitrite: NEGATIVE
PH: 6 (ref 5.0–8.0)
Protein, ur: NEGATIVE mg/dL
SPECIFIC GRAVITY, URINE: 1.015 (ref 1.005–1.030)
WBC, UA: >50 WBC/hpf — ABNORMAL HIGH (ref 0–5)

## 2018-04-05 MED ORDER — ACETAMINOPHEN 500 MG PO TABS
1000.0000 mg | ORAL_TABLET | Freq: Once | ORAL | Status: AC
  Administered 2018-04-05: 1000 mg via ORAL

## 2018-04-05 MED ORDER — ACETAMINOPHEN 500 MG PO TABS
ORAL_TABLET | ORAL | Status: AC
  Filled 2018-04-05: qty 2

## 2018-04-05 NOTE — Discharge Instructions (Signed)
Maternity Belt Waist Abdominal Binder-[employer] Belly Band

## 2018-04-05 NOTE — Telephone Encounter (Signed)
The Patient called and stated that she is experiencing abdominal pain and would like to know if she needs to be seen by a provider today or be advised of what to do next, The patient stated that she has been experiencing pain since 1 pm today. Please advise.

## 2018-04-05 NOTE — OB Triage Note (Signed)
  L&D OB Triage Note  SUBJECTIVE Oren Sectiononi N Rudin is a 22 y.o. 962P1001 female at 5220w6d, EDD Estimated Date of Delivery: 07/13/18 who presented to triage with complaints of round ligament pain 2/10 on pain scale.   OB History  Gravida Para Term Preterm AB Living  2 1 1  0 0 1  SAB TAB Ectopic Multiple Live Births  0 0 0 0 1    # Outcome Date GA Lbr Len/2nd Weight Sex Delivery Anes PTL Lv  2 Current           1 Term 06/01/14 351w0d  2980 g F Vag-Spont EPI  LIV     Birth Comments: WNL     Apgar1: 9  Apgar5: 9    Medications Prior to Admission  Medication Sig Dispense Refill Last Dose  . DICLEGIS 10-10 MG TBEC Take 1 tablet by mouth 3 (three) times daily. Take 1 tablet at bedtime, 1 tablet mid morning and 1 tablet mid afternoon 90 tablet 5 04/04/2018 at Unknown time  . Prenatal Vit-Fe Fumarate-FA (PRENATAL MULTIVITAMIN) TABS tablet Take 1 tablet by mouth daily at 12 noon.   04/04/2018 at Unknown time     OBJECTIVE  Nursing Evaluation:   BP 116/71 (BP Location: Left Arm)   Pulse 89   Temp 98.6 F (37 C) (Oral)   Resp 18   Ht 5' (1.524 m)   Wt 87.1 kg   LMP 09/21/2017 (Exact Date)   BMI 37.50 kg/m    Findings:   Round ligament pain  NST was performed and has been reviewed by me.  NST INTERPRETATION: Category I  Mode: External Baseline Rate (A): 150 bpm(fht) Variability: Moderate Accelerations: 15 x 15 Decelerations: None     Contraction Frequency (min): none  ASSESSMENT Impression:  1.  Pregnancy:  G2P1001 at 7220w6d , EDD Estimated Date of Delivery: 07/13/18 2.  NST:  Category I  PLAN 1. Reassurance given, discussed round ligament and self help measures.  2. Discharge home with standard labor precautions given to return to L&D or call the office for problems. 3. Continue routine prenatal care.  Doreene BurkeAnnie Pavel Gadd, CNM

## 2018-04-05 NOTE — OB Triage Note (Signed)
Pt. Presents to L/D with complaint of left lower abdominal cramping rated 3/10. She reports no bleeding or leaking of fluid. Positive fetal movement. No urinary symptoms and last BM 04/04/18. Last intercourse the week prior. Vitals WNL. Patient stable at this time. Will continue to monitor.

## 2018-04-06 ENCOUNTER — Telehealth: Payer: Self-pay

## 2018-04-06 NOTE — Telephone Encounter (Signed)
Pt reported to the ED. No response to 2 mychart messages.

## 2018-04-07 LAB — URINE CULTURE

## 2018-04-13 ENCOUNTER — Ambulatory Visit (INDEPENDENT_AMBULATORY_CARE_PROVIDER_SITE_OTHER): Payer: Medicaid Other | Admitting: Obstetrics and Gynecology

## 2018-04-13 ENCOUNTER — Other Ambulatory Visit: Payer: Medicaid Other

## 2018-04-13 VITALS — BP 101/66 | HR 99 | Wt 201.9 lb

## 2018-04-13 DIAGNOSIS — Z23 Encounter for immunization: Secondary | ICD-10-CM | POA: Diagnosis not present

## 2018-04-13 DIAGNOSIS — Z3493 Encounter for supervision of normal pregnancy, unspecified, third trimester: Secondary | ICD-10-CM

## 2018-04-13 DIAGNOSIS — Z3492 Encounter for supervision of normal pregnancy, unspecified, second trimester: Secondary | ICD-10-CM

## 2018-04-13 MED ORDER — TETANUS-DIPHTH-ACELL PERTUSSIS 5-2.5-18.5 LF-MCG/0.5 IM SUSP
0.5000 mL | Freq: Once | INTRAMUSCULAR | Status: AC
  Administered 2018-04-13: 0.5 mL via INTRAMUSCULAR

## 2018-04-13 MED ORDER — ALBUTEROL SULFATE HFA 108 (90 BASE) MCG/ACT IN AERS: 2.0000 | INHALATION_SPRAY | Freq: Four times a day (QID) | RESPIRATORY_TRACT | 2 refills | Status: DC | PRN

## 2018-04-13 NOTE — Progress Notes (Signed)
ROB- glucola done, blood consent signed,tdap given, pt is doing ok

## 2018-04-13 NOTE — Progress Notes (Signed)
ROB and glucola- doing well, Tdap given, states increased stressors with FOB but did not specify, needs inhaler.

## 2018-04-13 NOTE — Patient Instructions (Signed)

## 2018-04-14 LAB — CBC
Hematocrit: 32.3 % — ABNORMAL LOW (ref 34.0–46.6)
Hemoglobin: 10.9 g/dL — ABNORMAL LOW (ref 11.1–15.9)
MCH: 29.9 pg (ref 26.6–33.0)
MCHC: 33.7 g/dL (ref 31.5–35.7)
MCV: 89 fL (ref 79–97)
Platelets: 232 10*3/uL (ref 150–450)
RBC: 3.64 x10E6/uL — ABNORMAL LOW (ref 3.77–5.28)
RDW: 12.2 % — ABNORMAL LOW (ref 12.3–15.4)
WBC: 10.8 10*3/uL (ref 3.4–10.8)

## 2018-04-14 LAB — GLUCOSE TOLERANCE, 1 HOUR: GLUCOSE, 1HR PP: 156 mg/dL (ref 65–199)

## 2018-04-14 LAB — RPR: RPR Ser Ql: NONREACTIVE

## 2018-04-14 NOTE — L&D Delivery Note (Signed)
Delivery Note  1242 In room to see patient, reports pelvic pressure and the urge to push. SVE: 10/100/+1, vertex. Effective maternal pushing efforts noted.   Spontaneous vaginal birth of liveborn female patient in right occiput anterior position at 1258. Infant immediately to maternal abdomen. Delayed cord clamping, three (3) vessel cord and cord blood collected. APGARs: 8, 9. Weight pending. Receiving nurse present at bedside for birth.   Pitocin bolus infusing. Delivery of intact placenta at t 1303. Uterus firm. Rubra small. First degree perineal laceration well approximated and hemostatic, unrepaired. Vault check completed under adequate epidural anesthesia. Counts correct x 2. QBL: 180 ml.   Initiate routine postpartum care and orders. Mom to postpartum.  Baby to Couplet care / Skin to Skin.  FOB present at bedside and overjoyed with the birth of "[place]".    Gunnar Bulla, CNM [facility], [facility] 07/09/2018, 1:15 PM

## 2018-04-16 ENCOUNTER — Other Ambulatory Visit: Payer: Self-pay | Admitting: Certified Nurse Midwife

## 2018-04-16 DIAGNOSIS — R7309 Other abnormal glucose: Secondary | ICD-10-CM

## 2018-04-16 NOTE — Progress Notes (Signed)
Abnormal 28 week glucose test. Order placed for 3 hr.   Doreene BurkeAnnie Veanna Dower, CNM

## 2018-04-27 ENCOUNTER — Ambulatory Visit (INDEPENDENT_AMBULATORY_CARE_PROVIDER_SITE_OTHER): Payer: Medicaid Other | Admitting: Certified Nurse Midwife

## 2018-04-27 VITALS — BP 116/74 | HR 106 | Wt 200.9 lb

## 2018-04-27 DIAGNOSIS — Z3493 Encounter for supervision of normal pregnancy, unspecified, third trimester: Secondary | ICD-10-CM

## 2018-04-27 LAB — POCT URINALYSIS DIPSTICK OB
Bilirubin, UA: NEGATIVE
Blood, UA: NEGATIVE
Glucose, UA: NEGATIVE
Ketones, UA: NEGATIVE
NITRITE UA: NEGATIVE
Spec Grav, UA: 1.02 (ref 1.010–1.025)
Urobilinogen, UA: 0.2 E.U./dL
pH, UA: 6 (ref 5.0–8.0)

## 2018-04-27 NOTE — Patient Instructions (Addendum)
WE WOULD LOVE TO HEAR FROM YOU!!!!   Thank you [name] for visiting [facility].  Providing our patients with the best experience possible is really important to [place], and we hope that you felt that on your recent visit. The most valuable feedback we get comes from YOU!!    If you receive a survey please take a couple of minutes to let us know how we did.Thank you for continuing to trust [place] with your care.   [facility]    Glucose Tolerance Test Why am I having this test? The glucose tolerance test (GTT) is done to check how your body processes sugar (glucose). This is one of several tests used to diagnose diabetes (diabetes mellitus). Your health care provider may recommend this test if you:  Have a family history of diabetes.  Are very overweight (obese).  Have infections that keep coming back (recurring).  Have had a lot of wounds that did not heal quickly, especially on your legs and feet.  Are a woman and have a history of giving birth to very large babies or a history of repeated fetal loss (stillbirth).  Have had high glucose levels in your urine or blood: ? During a past pregnancy. ? After a heart attack, surgery, or prolonged periods of high stress. What is being tested? This test measures the amount of glucose in your blood at different times during a period of 2 hours. This indicates how well your body is able to process glucose. What kind of sample is taken?  Blood samples are required for this test. They are usually collected by inserting a needle into a blood vessel. How do I prepare for this test?  For 3 days before your test, eat normally. Have plenty of carbohydrate-rich foods.  Follow instructions from your health care provider about: ? Eating or drinking restrictions on the day of the test. You may be asked to not eat or drink anything other than water (fast) starting 8-12 hours before the test. ? Changing or stopping your  regular medicines. Some medicines may interfere with this test. Tell a health care provider about:  All medicines you are taking, including vitamins, herbs, eye drops, creams, and over-the-counter medicines.  Any blood disorders you have.  Any surgeries you have had.  Any medical conditions you have.  Whether you are pregnant or may be pregnant. What happens during the test? First, your blood glucose will be measured. This is referred to as your fasting blood glucose, since you fasted before the test. Then, you will drink a glucose solution that contains a certain amount of glucose. Your blood glucose will be measured again 1 and 2 hours after drinking the solution. This test takes 2 hours to complete. You will need to stay at the testing location during this time. During the testing period:  Do not eat or drink anything other than the glucose solution. You will be allowed to drink water.  Do not exercise.  Do not use any products that contain nicotine or tobacco, such as cigarettes and e-cigarettes. If you need help stopping, ask your health care provider. The testing procedure may vary among health care providers and hospitals. How are the results reported? Your results will be reported as milligrams of glucose per deciliter of blood (mg/dL) or millimoles per liter (mmol/L). Your health care provider will compare your results to normal ranges that were established after testing a large group of people (reference ranges). Reference ranges may vary  among labs and hospitals. For this test, common reference ranges are:  Fasting: less than 110 mg/dL (6.1 mmol/L).  1 hour after drinking glucose: less than 180 mg/dL (16.110.0 mmol/L).  2 hours after drinking glucose: less than 140 mg/dL (7.8 mmol/L). What do the results mean? Results that are within the reference ranges are considered normal, meaning that your glucose levels are well controlled. Results higher than the reference ranges may mean  that you recently experienced stress, such as from an injury or a sudden (acute) condition like a heart attack or stroke, or that you have:  Diabetes.  Cushing syndrome.  Tumors such as pheochromocytoma or glucagonoma.  Kidney failure.  Pancreatitis.  Hyperthyroidism.  An infection. Talk with your health care provider about what your results mean. Questions to ask your health care provider Ask your health care provider, or the department that is doing the test:  When will my results be ready?  How will I get my results?  What are my treatment options?  What other tests do I need?  What are my next steps? Summary  The glucose tolerance test (GTT) is done to check how your body processes sugar (glucose). This is one of several tests used to diagnose diabetes (diabetes mellitus).  This test measures the amount of glucose in your blood at different times during a period of 2 hours. This indicates how well your body is able to process glucose.  Talk with your health care provider about what your results mean. This information is not intended to replace advice given to you by your health care provider. Make sure you discuss any questions you have with your health care provider. Document Released: 04/23/2004 Document Revised: 11/10/2016 Document Reviewed: 11/10/2016 Elsevier Interactive Patient Education  2019 [employer]  Third Trimester of Pregnancy  The third trimester is from week 28 through week 40 (months 7 through 9). This trimester is when your unborn baby (fetus) is growing very fast. At the end of the ninth month, the unborn baby is about 20 inches in length. It weighs about 6-10 pounds. Follow these instructions at home: Medicines  Take over-the-counter and prescription medicines only as told by your doctor. Some medicines are safe and some medicines are not safe during pregnancy.  Take a prenatal vitamin that contains at least 600 micrograms (mcg) of folic  acid.  If you have trouble pooping (constipation), take medicine that will make your stool soft (stool softener) if your doctor approves. Eating and drinking   Eat regular, healthy meals.  Avoid raw meat and uncooked cheese.  If you get low calcium from the food you eat, talk to your doctor about taking a daily calcium supplement.  Eat four or five small meals rather than three large meals a day.  Avoid foods that are high in fat and sugars, such as fried and sweet foods.  To prevent constipation: ? Eat foods that are high in fiber, like fresh fruits and vegetables, whole grains, and beans. ? Drink enough fluids to keep your pee (urine) clear or pale yellow. Activity  Exercise only as told by your doctor. Stop exercising if you start to have cramps.  Avoid heavy lifting, wear low heels, and sit up straight.  Do not exercise if it is too hot, too humid, or if you are in a place of great height (high altitude).  You may continue to have sex unless your doctor tells you not to. Relieving pain and discomfort  Wear a good support bra  if your breasts are tender.  Take frequent breaks and rest with your legs raised if you have leg cramps or low back pain.  Take warm water baths (sitz baths) to soothe pain or discomfort caused by hemorrhoids. Use hemorrhoid cream if your doctor approves.  If you develop puffy, bulging veins (varicose veins) in your legs: ? Wear support hose or compression stockings as told by your doctor. ? Raise (elevate) your feet for 15 minutes, 3-4 times a day. ? Limit salt in your food. Safety  Wear your seat belt when driving.  Make a list of emergency phone numbers, including numbers for family, friends, the hospital, and police and fire departments. Preparing for your baby's arrival To prepare for the arrival of your baby:  Take prenatal classes.  Practice driving to the hospital.  Visit the hospital and tour the maternity area.  Talk to your  work about taking leave once the baby comes.  Pack your hospital bag.  Prepare the baby's room.  Go to your doctor visits.  Buy a rear-facing car seat. Learn how to install it in your car. General instructions  Do not use hot tubs, steam rooms, or saunas.  Do not use any products that contain nicotine or tobacco, such as cigarettes and e-cigarettes. If you need help quitting, ask your doctor.  Do not drink alcohol.  Do not douche or use tampons or scented sanitary pads.  Do not cross your legs for long periods of time.  Do not travel for long distances unless you must. Only do so if your doctor says it is okay.  Visit your dentist if you have not gone during your pregnancy. Use a soft toothbrush to brush your teeth. Be gentle when you floss.  Avoid cat litter boxes and soil used by cats. These carry germs that can cause birth defects in the baby and can cause a loss of your baby (miscarriage) or stillbirth.  Keep all your prenatal visits as told by your doctor. This is important. Contact a doctor if:  You are not sure if you are in labor or if your water has broken.  You are dizzy.  You have mild cramps or pressure in your lower belly.  You have a nagging pain in your belly area.  You continue to feel sick to your stomach, you throw up, or you have watery poop.  You have bad smelling fluid coming from your vagina.  You have pain when you pee. Get help right away if:  You have a fever.  You are leaking fluid from your vagina.  You are spotting or bleeding from your vagina.  You have severe belly cramps or pain.  You lose or gain weight quickly.  You have trouble catching your breath and have chest pain.  You notice sudden or extreme puffiness (swelling) of your face, hands, ankles, feet, or legs.  You have not felt the baby move in over an hour.  You have severe headaches that do not go away with medicine.  You have trouble seeing.  You are leaking, or  you are having a gush of fluid, from your vagina before you are 37 weeks.  You have regular belly spasms (contractions) before you are 37 weeks. Summary  The third trimester is from week 28 through week 40 (months 7 through 9). This time is when your unborn baby is growing very fast.  Follow your doctor's advice about medicine, food, and activity.  Get ready for the arrival of your  baby by taking prenatal classes, getting all the baby items ready, preparing the baby's room, and visiting your doctor to be checked.  Get help right away if you are bleeding from your vagina, or you have chest pain and trouble catching your breath, or if you have not felt your baby move in over an hour. This information is not intended to replace advice given to you by your health care provider. Make sure you discuss any questions you have with your health care provider. Document Released: 06/25/2009 Document Revised: 05/06/2016 Document Reviewed: 05/06/2016 Elsevier Interactive Patient Education  2019 [employer]

## 2018-04-27 NOTE — Progress Notes (Signed)
ROB, no complaints.  

## 2018-04-29 ENCOUNTER — Other Ambulatory Visit: Payer: Medicaid Other

## 2018-04-29 DIAGNOSIS — R7309 Other abnormal glucose: Secondary | ICD-10-CM

## 2018-04-29 NOTE — Progress Notes (Signed)
ROB-Doing well. Discussed results of glucola. Advised pt to return this week for GTT, verbalized understanding. Desires epidural for pain management. [facility] class schedule and [employer] pamphlet given. Anticipatory guidance regarding course of prenatal care. Reviewed red flag symptoms and when to call. RTC x 2 weeks for ROB. RTC ASAP for 3 hr GTT.

## 2018-04-30 LAB — GESTATIONAL GLUCOSE TOLERANCE
Glucose, Fasting: 81 mg/dL (ref 65–94)
Glucose, GTT - 1 Hour: 187 mg/dL — ABNORMAL HIGH (ref 65–179)
Glucose, GTT - 2 Hour: 132 mg/dL (ref 65–154)
Glucose, GTT - 3 Hour: 60 mg/dL — ABNORMAL LOW (ref 65–139)

## 2018-05-11 ENCOUNTER — Ambulatory Visit (INDEPENDENT_AMBULATORY_CARE_PROVIDER_SITE_OTHER): Payer: Medicaid Other | Admitting: Certified Nurse Midwife

## 2018-05-11 VITALS — BP 89/54 | HR 109 | Wt 204.0 lb

## 2018-05-11 DIAGNOSIS — Z3493 Encounter for supervision of normal pregnancy, unspecified, third trimester: Secondary | ICD-10-CM | POA: Diagnosis not present

## 2018-05-11 LAB — POCT URINALYSIS DIPSTICK OB
Bilirubin, UA: NEGATIVE
Glucose, UA: NEGATIVE
Ketones, UA: NEGATIVE
Leukocytes, UA: NEGATIVE
Nitrite, UA: NEGATIVE
RBC UA: NEGATIVE
Spec Grav, UA: 1.025 (ref 1.010–1.025)
Urobilinogen, UA: 0.2 E.U./dL
pH, UA: 5 (ref 5.0–8.0)

## 2018-05-11 NOTE — Progress Notes (Signed)
ROB doing well. Feels good movement. Pt asks about size of baby. She is concerned that baby will be to big. Discussed weight gain . Encouraged pt to eat healthy and exercise to minimize weight gain. She verbalizes and agrees to plan. Follow up 2 wks.   Doreene Burke, CNM

## 2018-05-11 NOTE — Patient Instructions (Signed)
How a Baby Grows During Pregnancy    Pregnancy begins when a female's sperm enters a female's egg (fertilization). Fertilization usually happens in one of the tubes (fallopian tubes) that connect the ovaries to the womb (uterus). The fertilized egg moves down the fallopian tube to the uterus. Once it reaches the uterus, it implants into the lining of the uterus and begins to grow.  For the first 10 weeks, the fertilized egg is called an embryo. After 10 weeks, it is called a fetus. As the fetus continues to grow, it receives oxygen and nutrients through tissue (placenta) that grows to support the developing baby. The placenta is the life support system for the baby. It provides oxygen and nutrition and removes waste.  Learning as much as you can about your pregnancy and how your baby is developing can help you enjoy the experience. It can also make you aware of when there might be a problem and when to ask questions.  How long does a typical pregnancy last?  A pregnancy usually lasts 280 days, or about 40 weeks. Pregnancy is divided into three periods of growth, also called trimesters:   First trimester: 0-12 weeks.   Second trimester: 13-27 weeks.   Third trimester: 28-40 weeks.  The day when your baby is ready to be born (full term) is your estimated date of delivery.  How does my baby develop month by month?  First month   The fertilized egg attaches to the inside of the uterus.   Some cells will form the placenta. Others will form the fetus.   The arms, legs, brain, spinal cord, lungs, and heart begin to develop.   At the end of the first month, the heart begins to beat.  Second month   The bones, inner ear, eyelids, hands, and feet form.   The genitals develop.   By the end of 8 weeks, all major organs are developing.  Third month   All of the internal organs are forming.   Teeth develop below the gums.   Bones and muscles begin to grow. The spine can flex.   The skin is transparent.   Fingernails  and toenails begin to form.   Arms and legs continue to grow longer, and hands and feet develop.   The fetus is about 3 inches (7.6 cm) long.  Fourth month   The placenta is completely formed.   The external sex organs, neck, outer ear, eyebrows, eyelids, and fingernails are formed.   The fetus can hear, swallow, and move its arms and legs.   The kidneys begin to produce urine.   The skin is covered with a white, waxy coating (vernix) and very fine hair (lanugo).  Fifth month   The fetus moves around more and can be felt for the first time (quickening).   The fetus starts to sleep and wake up and may begin to suck its finger.   The nails grow to the end of the fingers.   The organ in the digestive system that makes bile (gallbladder) functions and helps to digest nutrients.   If your baby is a girl, eggs are present in her ovaries. If your baby is a boy, testicles start to move down into his scrotum.  Sixth month   The lungs are formed.   The eyes open. The brain continues to develop.   Your baby has fingerprints and toe prints. Your baby's hair grows thicker.   At the end of the second trimester, the   fetus is about 9 inches (22.9 cm) long.  Seventh month   The fetus kicks and stretches.   The eyes are developed enough to sense changes in light.   The hands can make a grasping motion.   The fetus responds to sound.  Eighth month   All organs and body systems are fully developed and functioning.   Bones harden, and taste buds develop. The fetus may hiccup.   Certain areas of the brain are still developing. The skull remains soft.  Ninth month   The fetus gains about  lb (0.23 kg) each week.   The lungs are fully developed.   Patterns of sleep develop.   The fetus's head typically moves into a head-down position (vertex) in the uterus to prepare for birth.   The fetus weighs 6-9 lb (2.72-4.08 kg) and is 19-20 inches (48.26-50.8 cm) long.  What can I do to have a healthy pregnancy and help  my baby develop?  General instructions   Take prenatal vitamins as directed by your health care provider. These include vitamins such as folic acid, iron, calcium, and vitamin D. They are important for healthy development.   Take medicines only as directed by your health care provider. Read labels and ask a pharmacist or your health care provider whether over-the-counter medicines, supplements, and prescription drugs are safe to take during pregnancy.   Keep all follow-up visits as directed by your health care provider. This is important. Follow-up visits include prenatal care and screening tests.  How do I know if my baby is developing well?  At each prenatal visit, your health care provider will do several different tests to check on your health and keep track of your baby's development. These include:   Fundal height and position.  ? Your health care provider will measure your growing belly from your pubic bone to the top of the uterus using a tape measure.  ? Your health care provider will also feel your belly to determine your baby's position.   Heartbeat.  ? An ultrasound in the first trimester can confirm pregnancy and show a heartbeat, depending on how far along you are.  ? Your health care provider will check your baby's heart rate at every prenatal visit.   Second trimester ultrasound.  ? This ultrasound checks your baby's development. It also may show your baby's gender.  What should I do if I have concerns about my baby's development?  Always talk with your health care provider about any concerns that you may have about your pregnancy and your baby.  Summary   A pregnancy usually lasts 280 days, or about 40 weeks. Pregnancy is divided into three periods of growth, also called trimesters.   Your health care provider will monitor your baby's growth and development throughout your pregnancy.   Follow your health care provider's recommendations about taking prenatal vitamins and medicines during  your pregnancy.   Talk with your health care provider if you have any concerns about your pregnancy or your developing baby.  This information is not intended to replace advice given to you by your health care provider. Make sure you discuss any questions you have with your health care provider.  Document Released: 09/17/2007 Document Revised: 02/11/2017 Document Reviewed: 02/11/2017  Elsevier Interactive Patient Education  2019 [employer]vier Inc.

## 2018-05-22 ENCOUNTER — Other Ambulatory Visit: Payer: Self-pay

## 2018-05-22 ENCOUNTER — Observation Stay
Admission: EM | Admit: 2018-05-22 | Discharge: 2018-05-22 | Disposition: A | Discharge status: Home / Self Care | Payer: Medicaid Other | Attending: Certified Nurse Midwife | Admitting: Certified Nurse Midwife

## 2018-05-22 DIAGNOSIS — Z79899 Other long term (current) drug therapy: Secondary | ICD-10-CM | POA: Insufficient documentation

## 2018-05-22 DIAGNOSIS — O99513 Diseases of the respiratory system complicating pregnancy, third trimester: Secondary | ICD-10-CM | POA: Diagnosis not present

## 2018-05-22 DIAGNOSIS — Z3A32 32 weeks gestation of pregnancy: Secondary | ICD-10-CM | POA: Insufficient documentation

## 2018-05-22 DIAGNOSIS — Z87891 Personal history of nicotine dependence: Secondary | ICD-10-CM | POA: Insufficient documentation

## 2018-05-22 DIAGNOSIS — J45909 Unspecified asthma, uncomplicated: Secondary | ICD-10-CM | POA: Insufficient documentation

## 2018-05-22 DIAGNOSIS — O4703 False labor before 37 completed weeks of gestation, third trimester: Secondary | ICD-10-CM

## 2018-05-22 DIAGNOSIS — O26893 Other specified pregnancy related conditions, third trimester: Secondary | ICD-10-CM | POA: Diagnosis not present

## 2018-05-22 LAB — URINALYSIS, ROUTINE W REFLEX MICROSCOPIC
Bilirubin Urine: NEGATIVE
Glucose, UA: NEGATIVE mg/dL
Hgb urine dipstick: NEGATIVE
Ketones, ur: NEGATIVE mg/dL
Leukocytes, UA: NEGATIVE
Nitrite: NEGATIVE
Protein, ur: NEGATIVE mg/dL
SPECIFIC GRAVITY, URINE: 1.019 (ref 1.005–1.030)
pH: 6 (ref 5.0–8.0)

## 2018-05-22 LAB — FETAL FIBRONECTIN: Fetal Fibronectin: NEGATIVE

## 2018-05-22 MED ORDER — ACETAMINOPHEN 500 MG PO TABS
ORAL_TABLET | ORAL | Status: AC
  Administered 2018-05-22: 1000 mg via ORAL
  Filled 2018-05-22: qty 2

## 2018-05-22 MED ORDER — HYDROXYZINE HCL 50 MG PO TABS
50.0000 mg | ORAL_TABLET | Freq: Once | ORAL | Status: AC
  Administered 2018-05-22: 50 mg via ORAL
  Filled 2018-05-22: qty 1

## 2018-05-22 MED ORDER — HYDROXYZINE HCL 50 MG PO TABS: 50.0000 mg | ORAL_TABLET | Freq: Four times a day (QID) | ORAL | 0 refills | Status: DC | PRN

## 2018-05-22 MED ORDER — ACETAMINOPHEN 500 MG PO TABS
1000.0000 mg | ORAL_TABLET | Freq: Once | ORAL | Status: AC
  Administered 2018-05-22: 1000 mg via ORAL

## 2018-05-22 NOTE — OB Triage Note (Signed)
Patient here for contractions and back pain x 4 days they come and go and are random. Denies any other complications.

## 2018-05-23 NOTE — Discharge Summary (Signed)
Obstetric Discharge Summary  Patient ID: Christina Warner MRN: [id number] DOB/AGE: 1995/04/15 22 y.o.   Date of Admission: 05/22/2018  Date of Discharge: 05/22/2018  Admitting Diagnosis: Observation at [redacted]w[redacted]d  Secondary Diagnosis: Toxoplanmosis IgG positive, Previous smoker, History of asthma, BMI>30, History forceps delivery     Discharge Diagnosis: No other diagnosis   Antepartum Procedures: NST, Tylenol PO, and Vistaril PO   Brief Hospital Course   L&D OB Triage Note  Christina Warner is a 23 y.o. G61P1001 female at [redacted]w[redacted]d, EDD Estimated Date of Delivery: 07/13/18 who presented to triage for complaints of irregular uterine contractions and back pain for the last four (4) days.  She was evaluated by the nurses with no significant findings for fetal distress or preterm labor. Vital signs stable. An NST was performed and has been reviewed by CNM. She was treated with oral Tylenol and Vistaril.   NST INTERPRETATION:  Indications: rule out uterine contractions  Mode: External Baseline Rate (A): 130 bpm Variability: Moderate Accelerations: 15 x 15 Decelerations: None Contraction Frequency (min): irr  Impression: reactive   Plan: NST performed was reviewed and was found to be reactive. She was discharged home with bleeding/labor precautions. Rx: Vistaril, see orders. Continue routine prenatal care. Follow up with CNM as previously scheduled.    Discharge Instructions: Per After Visit Summary  Activity: Per After Visit Summary  Diet: Regular  Medications: Allergies as of 05/22/2018      Reactions   Motrin [ibuprofen]       Medication List    TAKE these medications   albuterol 108 (90 Base) MCG/ACT inhaler Commonly known as:  PROVENTIL HFA;VENTOLIN HFA Inhale 2 puffs into the lungs every 6 (six) hours as needed for wheezing or shortness of breath.   DICLEGIS 10-10 MG Tbec Generic drug:  Doxylamine-Pyridoxine Take 1 tablet by mouth 3 (three) times daily. Take 1 tablet at  bedtime, 1 tablet mid morning and 1 tablet mid afternoon   hydrOXYzine 50 MG tablet Commonly known as:  ATARAX/VISTARIL Take 1 tablet (50 mg total) by mouth every 6 (six) hours as needed (contractions).   prenatal multivitamin Tabs tablet Take 1 tablet by mouth daily at 12 noon.      Outpatient follow up: As previously schedule or sooner if needed  Discharged Condition: good  Discharged to: home   Gunnar Bulla, CNM [facility], [facility]

## 2018-05-26 ENCOUNTER — Ambulatory Visit (INDEPENDENT_AMBULATORY_CARE_PROVIDER_SITE_OTHER): Payer: Medicaid Other | Admitting: Obstetrics and Gynecology

## 2018-05-26 VITALS — BP 98/64 | HR 93 | Wt 205.7 lb

## 2018-05-26 DIAGNOSIS — Z3493 Encounter for supervision of normal pregnancy, unspecified, third trimester: Secondary | ICD-10-CM

## 2018-05-26 LAB — POCT URINALYSIS DIPSTICK OB
Bilirubin, UA: NEGATIVE
Blood, UA: NEGATIVE
Glucose, UA: NEGATIVE
KETONES UA: NEGATIVE
Nitrite, UA: NEGATIVE
POC,PROTEIN,UA: NEGATIVE
Spec Grav, UA: 1.01 (ref 1.010–1.025)
Urobilinogen, UA: 0.2 E.U./dL
pH, UA: 6 (ref 5.0–8.0)

## 2018-05-26 NOTE — Progress Notes (Signed)
ROB- pt is doing well, went to ER 05/22/18

## 2018-05-26 NOTE — Progress Notes (Signed)
Christina Warner- seen at L&D 4 days ago with irregular [facility] and cleared. States she had scant spotting 2 days ago with no other changes. Microscopic wet-mount exam shows negative for pathogens, normal epithelial cells. Reassured of normal findings. Labor precautions discussed. Urine sent for cultures.

## 2018-05-28 LAB — URINE CULTURE

## 2018-06-01 ENCOUNTER — Ambulatory Visit (INDEPENDENT_AMBULATORY_CARE_PROVIDER_SITE_OTHER): Payer: Medicaid Other | Admitting: Certified Nurse Midwife

## 2018-06-01 ENCOUNTER — Other Ambulatory Visit (HOSPITAL_COMMUNITY)
Admission: RE | Admit: 2018-06-01 | Discharge: 2018-06-01 | Disposition: A | Discharge status: Home / Self Care | Payer: Medicaid Other | Source: Ambulatory Visit | Attending: Certified Nurse Midwife | Admitting: Certified Nurse Midwife

## 2018-06-01 VITALS — BP 117/76 | HR 107 | Wt 209.7 lb

## 2018-06-01 DIAGNOSIS — N898 Other specified noninflammatory disorders of vagina: Secondary | ICD-10-CM

## 2018-06-01 DIAGNOSIS — O4693 Antepartum hemorrhage, unspecified, third trimester: Secondary | ICD-10-CM | POA: Diagnosis not present

## 2018-06-01 DIAGNOSIS — O26893 Other specified pregnancy related conditions, third trimester: Secondary | ICD-10-CM | POA: Diagnosis not present

## 2018-06-01 DIAGNOSIS — Z3483 Encounter for supervision of other normal pregnancy, third trimester: Secondary | ICD-10-CM | POA: Diagnosis not present

## 2018-06-01 LAB — POCT URINALYSIS DIPSTICK OB
BILIRUBIN UA: NEGATIVE
Glucose, UA: NEGATIVE
Ketones, UA: NEGATIVE
Nitrite, UA: NEGATIVE
Spec Grav, UA: 1.02 (ref 1.010–1.025)
Urobilinogen, UA: 0.2 E.U./dL
pH, UA: 7 (ref 5.0–8.0)

## 2018-06-01 NOTE — Patient Instructions (Signed)
Vaginal Bleeding During Pregnancy, Third Trimester ° °A small amount of bleeding (spotting) from the vagina is common during pregnancy. Sometimes the bleeding is normal and is not a problem, and sometimes it is a sign of something serious. Tell your doctor about any bleeding from your vagina right away. °Follow these instructions at home: °Activity °· Follow your doctor's instructions about how active you can be. Your doctor may recommend that you: °? Stay in bed and only get up to use the bathroom. °? Continue light activity. °· If needed, make plans for someone to help you with your normal activities. °· Ask your doctor if it is safe for you to drive. °· Do not lift anything that is heavier than 10 lb (4.5 kg) until your doctor says that this is safe. °· Do not have sex or orgasms until your doctor says that this is safe. °Medicines °· Take over-the-counter and prescription medicines only as told by your doctor. °· Do not take aspirin. It can cause bleeding. °General instructions °· Watch your condition for any changes. °· Write down: °? The number of pads you use each day. °? How often you change pads. °? How soaked (saturated) your pads are. °· Do not use tampons. °· Do not douche. °· If you pass any tissue from your vagina, save the tissue to show your doctor. °· Keep all follow-up visits as told by your doctor. This is important. °Contact a doctor if: °· You have vaginal bleeding at any time during pregnancy. °· You have cramps. °· You have a fever. °Get help right away if: °· You have very bad cramps. °· You have very bad pain in your back or belly (abdomen). °· You have a gush of fluid from your vagina. °· You pass large clots or a lot of tissue from your vagina. °· Your bleeding gets worse. °· You feel light-headed or weak. °· You pass out (faint). °· Your baby is moving less than usual, or not moving at all. °Summary °· Tell your doctor about any bleeding from your vagina right away. °· Follow instructions  from your doctor about how active you can be. You may need someone to help you with your normal activities. °This information is not intended to replace advice given to you by your health care provider. Make sure you discuss any questions you have with your health care provider. °Document Released: 08/15/2013 Document Revised: 07/02/2016 Document Reviewed: 07/02/2016 °Elsevier Interactive Patient Education © 2019 [employer]vier Inc. ° °

## 2018-06-01 NOTE — Progress Notes (Signed)
Subjective:   Christina Warner is a 23 y.o. G2P1001 [redacted]w[redacted]d being seen today for work in problem obstetrical visit.    Patient reports pink vaginal spotting with wiping for the last two (2) days.    No contractions or leaking of fluid.  Reports good fetal movement.  Denies difficulty breathing or respiratory distress, chest pain, abdominal pain, dysuria, and leg pain or swelling.   The following portions of the patient's history were reviewed and updated as appropriate: allergies, current medications, past family history, past medical history, past social history, past surgical history and problem list.   Review of Systems:  ROS negative except as noted above. Information obtained from patient.   Objective:   BP 117/76   Pulse (!) 107   Wt 209 lb 11.2 oz (95.1 kg)   LMP 09/21/2017 (Exact Date)   BMI 40.95 kg/m   FHT: Fetal Heart Rate (bpm): 143  Uterine Size: Fundal Height: 34 cm  Fetal Movement: Movement: Present  Presentation: Presentation: Vertex   Abdomen:  soft, gravid, appropriate for gestational age,non-tender  Vaginal:  Discharge, white; irritation noted to left vaginal wall rugae     Results for orders placed or performed in visit on 06/01/18 (from the past 24 hour(s))  POC Urinalysis Dipstick OB     Status: Abnormal   Collection Time: 06/01/18  2:17 PM  Result Value Ref Range   Color, UA yellow    Clarity, UA clear    Glucose, UA Negative Negative   Bilirubin, UA neg    Ketones, UA neg    Spec Grav, UA 1.020 1.010 - 1.025   Blood, UA small 1+    pH, UA 7.0 5.0 - 8.0   POC,PROTEIN,UA Trace Negative, Trace, Small (1+), Moderate (2+), Large (3+), 4+   Urobilinogen, UA 0.2 0.2 or 1.0 E.U./dL   Nitrite, UA neg    Leukocytes, UA Moderate (2+) (A) Negative   Appearance yellow    Odor      Assessment:   Pregnancy:  G2P1001 at [redacted]w[redacted]d  1. Encounter for supervision of other normal pregnancy in third trimester  - POC Urinalysis Dipstick OB - Culture, OB Urine -  Cervicovaginal ancillary only  2. Vaginal discharge during pregnancy in third trimester  - Culture, OB Urine - Cervicovaginal ancillary only  3. Vaginal bleeding in pregnancy, third trimester  - Culture, OB Urine - Cervicovaginal ancillary only  Plan:   Vaginal swab collected; see orders. Will send urine for culture as well.   Discussed with patient that vaginal spotting is likely due to irritation.   Preterm labor symptoms: vaginal bleeding, contractions and leaking of fluid reviewed in detail.  Fetal movement precautions reviewed.  Follow up as previously scheduled or sooner if needed.   Gunnar Bulla, CNM [facility], [facility]

## 2018-06-01 NOTE — Progress Notes (Signed)
ROB-Pt stated that she noticed light bleeding yesterday and was advised to come in and be seen. Pt stated that she is still noticing some bleeding. Pt stated that the baby has been moving without any problems.

## 2018-06-03 ENCOUNTER — Telehealth: Payer: Self-pay

## 2018-06-03 ENCOUNTER — Telehealth: Payer: Self-pay | Admitting: Certified Nurse Midwife

## 2018-06-03 LAB — CERVICOVAGINAL ANCILLARY ONLY
Bacterial vaginitis: POSITIVE — AB
Candida vaginitis: POSITIVE — AB
Chlamydia: NEGATIVE
Neisseria Gonorrhea: NEGATIVE
Trichomonas: POSITIVE — AB

## 2018-06-03 NOTE — Telephone Encounter (Signed)
The patient called and stated that she is still experiencing vaginal bleeding and it has worsened. The patient stated that she is 34 weeks and is concerned and would like to speak with a nurse or provider as soon as possible. Please advise.

## 2018-06-03 NOTE — Telephone Encounter (Signed)
Spoke with patient- states she is bleeding- a small amount but enough to wear a panty liner. Some period like cramping. Advised to rest, hydrate, put a clean panty liner on and in an hour let me know how she is doing.

## 2018-06-04 ENCOUNTER — Other Ambulatory Visit: Payer: Self-pay | Admitting: Certified Nurse Midwife

## 2018-06-04 MED ORDER — TERCONAZOLE 0.4 % VA CREA: 1.0000 | TOPICAL_CREAM | Freq: Every day | VAGINAL | 0 refills | Status: DC

## 2018-06-04 MED ORDER — METRONIDAZOLE 500 MG PO TABS: 500.0000 mg | ORAL_TABLET | Freq: Two times a day (BID) | ORAL | 0 refills | Status: DC

## 2018-06-04 NOTE — Telephone Encounter (Signed)
Christina Warner,   Just sent her a MyChart message. She has trich, BV, and yeast. Probably the sources of her vaginal bleeding. Encourage her to pick up her treatments. Thanks, JML

## 2018-06-10 ENCOUNTER — Ambulatory Visit (INDEPENDENT_AMBULATORY_CARE_PROVIDER_SITE_OTHER): Payer: Medicaid Other | Admitting: Certified Nurse Midwife

## 2018-06-10 VITALS — BP 124/79 | HR 104 | Wt 210.1 lb

## 2018-06-10 DIAGNOSIS — Z3493 Encounter for supervision of normal pregnancy, unspecified, third trimester: Secondary | ICD-10-CM

## 2018-06-10 NOTE — Patient Instructions (Signed)

## 2018-06-10 NOTE — Progress Notes (Signed)
ROB- Endorses vaginal bleeding has resolved. Endorses pelvic pressure and irregular contractions. Endorses b/l leg swelling. Denies pain or redness. Discussed home treatment measures, but patient declines desire to use TED hose. Discussed anticipatory guidance regarding future prenatal visits. Reviewed red flag symptoms and when to call. RTC x 1 week for ROB with Pattricia Boss.

## 2018-06-16 ENCOUNTER — Encounter: Payer: Self-pay | Admitting: Certified Nurse Midwife

## 2018-06-16 ENCOUNTER — Ambulatory Visit (INDEPENDENT_AMBULATORY_CARE_PROVIDER_SITE_OTHER): Payer: Medicaid Other | Admitting: Certified Nurse Midwife

## 2018-06-16 VITALS — BP 103/69 | HR 91 | Wt 213.0 lb

## 2018-06-16 DIAGNOSIS — Z3493 Encounter for supervision of normal pregnancy, unspecified, third trimester: Secondary | ICD-10-CM

## 2018-06-16 LAB — POCT URINALYSIS DIPSTICK OB
Bilirubin, UA: NEGATIVE
Glucose, UA: NEGATIVE
Ketones, UA: NEGATIVE
LEUKOCYTES UA: NEGATIVE
Nitrite, UA: NEGATIVE
POC,PROTEIN,UA: NEGATIVE
RBC UA: NEGATIVE
Spec Grav, UA: 1.02 (ref 1.010–1.025)
Urobilinogen, UA: 0.2 E.U./dL
pH, UA: 5 (ref 5.0–8.0)

## 2018-06-16 NOTE — Patient Instructions (Signed)
Group B Streptococcus Infection During Pregnancy  Group B Streptococcus (GBS) is a type of bacteria (Streptococcus agalactiae) that is often found in healthy people, commonly in the rectum, vagina, and intestines. In people who are healthy and not pregnant, the bacteria rarely cause serious illness or complications. However, women who test positive for GBS during pregnancy can pass the bacteria to their baby during childbirth, which can cause serious infection in the baby after birth. Women with GBS may also have infections during their pregnancy or immediately after childbirth, such as such as urinary tract infections (UTIs) or infections of the uterus (uterine infections). Having GBS also increases a woman's risk of complications during pregnancy, such as early (preterm) labor or delivery, miscarriage, or stillbirth. Routine testing (screening) for GBS is recommended for all pregnant women. What increases the risk? You may have a higher risk for GBS infection during pregnancy if you had one during a past pregnancy. What are the signs or symptoms? In most cases, GBS infection does not cause symptoms in pregnant women. Signs and symptoms of a possible GBS-related infection may include:  Labor starting before the 37th week of pregnancy.  A UTI or bladder infection, which may cause: ? Fever. ? Pain or burning during urination. ? Frequent urination.  Fever during labor, along with: ? Bad-smelling discharge. ? Uterine tenderness. ? Rapid heartbeat in the mother, baby, or both. Rare but serious symptoms of a possible GBS-related infection in women include:  Blood infection (septicemia). This may cause fever, chills, or confusion.  Lung infection (pneumonia). This may cause fever, chills, cough, rapid breathing, difficulty breathing, or chest pain.  Bone, joint, skin, or soft tissue infection. How is this diagnosed? You may be screened for GBS between week 35 and week 37 of your pregnancy. If  you have symptoms of preterm labor, you may be screened earlier. This condition is diagnosed based on lab test results from:  A swab of fluid from the vagina and rectum.  A urine sample. How is this treated? This condition is treated with antibiotic medicine. When you go into labor, or as soon as your water breaks (your membranes rupture), you will be given antibiotics through an IV tube. Antibiotics will continue until after you give birth. If you are having a cesarean delivery, you do not need antibiotics unless your membranes have already ruptured. Follow these instructions at home:  Take over-the-counter and prescription medicines only as told by your health care provider.  Take your antibiotic medicine as told by your health care provider. Do not stop taking the antibiotic even if you start to feel better.  Keep all pre-birth (prenatal) visits and follow-up visits as told by your health care provider. This is important. Contact a health care provider if:  You have pain or burning when you urinate.  You have to urinate frequently.  You have a fever or chills.  You develop a bad-smelling vaginal discharge. Get help right away if:  Your membranes rupture.  You go into labor.  You have severe pain in your abdomen.  You have difficulty breathing.  You have chest pain. This information is not intended to replace advice given to you by your health care provider. Make sure you discuss any questions you have with your health care provider. Document Released: 07/08/2007 Document Revised: 10/26/2015 Document Reviewed: 10/25/2015 Elsevier Interactive Patient Education  2019 Elsevier Inc.  

## 2018-06-16 NOTE — Progress Notes (Signed)
ROB doing well. Feels good movement. GBS and cultures today. SVE per pt request 2/50/-2 vertex. Reviewed labor precautions. Follow up 1 wk.   Doreene Burke, CNM

## 2018-06-18 LAB — STREP GP B NAA: STREP GROUP B AG: POSITIVE — AB

## 2018-06-18 LAB — GC/CHLAMYDIA PROBE AMP
Chlamydia trachomatis, NAA: NEGATIVE
Neisseria gonorrhoeae by PCR: NEGATIVE

## 2018-06-24 ENCOUNTER — Other Ambulatory Visit: Payer: Self-pay

## 2018-06-24 ENCOUNTER — Ambulatory Visit (INDEPENDENT_AMBULATORY_CARE_PROVIDER_SITE_OTHER): Payer: Medicaid Other | Admitting: Obstetrics and Gynecology

## 2018-06-24 VITALS — BP 108/76 | HR 113 | Wt 213.9 lb

## 2018-06-24 DIAGNOSIS — Z3493 Encounter for supervision of normal pregnancy, unspecified, third trimester: Secondary | ICD-10-CM

## 2018-06-24 LAB — POCT URINALYSIS DIPSTICK OB
Bilirubin, UA: NEGATIVE
Blood, UA: NEGATIVE
Ketones, UA: NEGATIVE
Leukocytes, UA: NEGATIVE
Nitrite, UA: NEGATIVE
POC,PROTEIN,UA: NEGATIVE
SPEC GRAV UA: 1.01 (ref 1.010–1.025)
UROBILINOGEN UA: 0.2 U/dL
pH, UA: 6 (ref 5.0–8.0)

## 2018-06-24 NOTE — Progress Notes (Signed)
ROB- reports increase in pelvic pressure with contractions, labor precautions discussed. Discussed GBS+ treatment.

## 2018-06-24 NOTE — Progress Notes (Signed)
ROB- pt is having pelvic pressure, L leg inner thigh

## 2018-06-25 ENCOUNTER — Other Ambulatory Visit: Payer: Self-pay

## 2018-06-25 ENCOUNTER — Observation Stay
Admission: EM | Admit: 2018-06-25 | Discharge: 2018-06-26 | Disposition: A | Discharge status: Home / Self Care | Payer: Medicaid Other | Attending: Obstetrics and Gynecology | Admitting: Obstetrics and Gynecology

## 2018-06-25 DIAGNOSIS — M545 Low back pain: Principal | ICD-10-CM | POA: Insufficient documentation

## 2018-06-25 DIAGNOSIS — Z3490 Encounter for supervision of normal pregnancy, unspecified, unspecified trimester: Secondary | ICD-10-CM

## 2018-06-25 DIAGNOSIS — O26893 Other specified pregnancy related conditions, third trimester: Secondary | ICD-10-CM | POA: Insufficient documentation

## 2018-06-25 DIAGNOSIS — Z3A38 38 weeks gestation of pregnancy: Secondary | ICD-10-CM | POA: Insufficient documentation

## 2018-06-25 NOTE — OB Triage Note (Addendum)
Pt G2P1 [redacted]w[redacted]d presents to ED c/o back pain that started at 10pm, pt states, "It is constant pain in the back but it does get worse sometimes, but its like every 5 minutes. It is hard to describe." Denies leaking of fluid, vaginal bleeding, and positive fetal movement. External monitors applied and assessing. VS WNL. M.Shambley, CNM notified on pt, verbal order of PO 650 tylenol and d/c home.

## 2018-06-26 ENCOUNTER — Other Ambulatory Visit: Payer: Self-pay

## 2018-06-26 DIAGNOSIS — M545 Low back pain: Secondary | ICD-10-CM

## 2018-06-26 DIAGNOSIS — O26893 Other specified pregnancy related conditions, third trimester: Secondary | ICD-10-CM

## 2018-06-26 DIAGNOSIS — Z3A38 38 weeks gestation of pregnancy: Secondary | ICD-10-CM

## 2018-06-26 MED ORDER — ACETAMINOPHEN 325 MG PO TABS
650.0000 mg | ORAL_TABLET | Freq: Once | ORAL | Status: AC
  Administered 2018-06-26: 650 mg via ORAL

## 2018-06-26 MED ORDER — ACETAMINOPHEN 325 MG PO TABS
ORAL_TABLET | ORAL | Status: AC
  Administered 2018-06-26: 650 mg via ORAL
  Filled 2018-06-26: qty 2

## 2018-06-29 NOTE — Discharge Summary (Signed)
L&D OB Triage Note  Christina Warner is a 23 y.o. G2P1001 female at [redacted]w[redacted]d, EDD Estimated Date of Delivery: 07/13/18 who presented to triage for complaints of low back pain that is persistant and gets somewhat worse every five minutes..  She was evaluated by the nurses with no significant findings/findings significant for labor. Vital signs stable. An NST was performed and has been reviewed by me. She was treated with po tylenol and hydration.   NST INTERPRETATION: Indications: rule out uterine contractions  Mode: External Baseline Rate (A): 140 bpm(fht) Variability: Moderate Accelerations: 15 x 15 Decelerations: None     Contraction Frequency (min): unable to assess  Impression: reactive   Plan: NST performed was reviewed and was found to be reactive. She was discharged home with bleeding/labor precautions.  Encouraged tylenol use as needed. Heat to low back is OK Continue routine prenatal care. Follow up with OB/GYN as previously scheduled.     Melody Suzan Nailer, CNM

## 2018-07-01 ENCOUNTER — Ambulatory Visit (INDEPENDENT_AMBULATORY_CARE_PROVIDER_SITE_OTHER): Payer: Medicaid Other | Admitting: Certified Nurse Midwife

## 2018-07-01 ENCOUNTER — Other Ambulatory Visit: Payer: Self-pay

## 2018-07-01 VITALS — BP 105/67 | HR 96 | Wt 210.1 lb

## 2018-07-01 DIAGNOSIS — Z3493 Encounter for supervision of normal pregnancy, unspecified, third trimester: Secondary | ICD-10-CM

## 2018-07-01 NOTE — Patient Instructions (Signed)

## 2018-07-01 NOTE — Progress Notes (Signed)
IOL scheduled per Herma Carson, RN

## 2018-07-01 NOTE — Progress Notes (Signed)
ROB-Reports irregular contractions. SVE unchanged since previous visit. IOL scheduled for 07/09/2018 at 0500 for Body mass index is 41.04 kg/m. Reviewed red flag symptoms and when to call. RTC x 1 week for ROB or sooner if needed.

## 2018-07-07 NOTE — Progress Notes (Signed)
Coronavirus (COVID-19) Are you at risk?  Are you at risk for the Coronavirus (COVID-19)?  To be considered HIGH RISK for Coronavirus (COVID-19), you have to meet the following criteria:  . Traveled to [place], [place], [place], [place] or [place]; or in the [place] to [place], [place], [place], or [place]; and have fever, cough, and shortness of breath within the last 2 weeks of travel OR . Been in close contact with a person diagnosed with COVID-19 within the last 2 weeks and have fever, cough, and shortness of breath . IF YOU DO NOT MEET THESE CRITERIA, YOU ARE CONSIDERED LOW RISK FOR COVID-19.  What to do if you are HIGH RISK for COVID-19?  . If you are having a medical emergency, call 911. . Seek medical care right away. Before you go to a doctor's office, urgent care or emergency department, call ahead and tell them about your recent travel, contact with someone diagnosed with COVID-19, and your symptoms. You should receive instructions from your physician's office regarding next steps of care.  . When you arrive at healthcare provider, tell the healthcare staff immediately you have returned from visiting [place], [place], [place], [place] or [place]; or traveled in the [place] to [place], [place], [place], or [place]; in the last two weeks or you have been in close contact with a person diagnosed with COVID-19 in the last 2 weeks.   . Tell the health care staff about your symptoms: fever, cough and shortness of breath. . After you have been seen by a medical provider, you will be either: o Tested for (COVID-19) and discharged home on quarantine except to seek medical care if symptoms worsen, and asked to  - Stay home and avoid contact with others until you get your results (4-5 days)  - Avoid travel on public transportation if possible (such as bus, train, or airplane) or o Sent to the Emergency Department by EMS for evaluation, COVID-19 testing, and possible  admission depending on your condition and test results.  What to do if you are LOW RISK for COVID-19?  Reduce your risk of any infection by using the same precautions used for avoiding the common cold or flu:  . Wash your hands often with soap and warm water for at least 20 seconds.  If soap and water are not readily available, use an alcohol-based hand sanitizer with at least 60% alcohol.  . If coughing or sneezing, cover your mouth and nose by coughing or sneezing into the elbow areas of your shirt or coat, into a tissue or into your sleeve (not your hands). . Avoid shaking hands with others and consider head nods or verbal greetings only. . Avoid touching your eyes, nose, or mouth with unwashed hands.  . Avoid close contact with people who are sick. . Avoid places or events with large numbers of people in one location, like concerts or sporting events. . Carefully consider travel plans you have or are making. . If you are planning any travel outside or inside the [place], visit the CDC's Travelers' Health webpage for the latest health notices. . If you have some symptoms but not all symptoms, continue to monitor at home and seek medical attention if your symptoms worsen. . If you are having a medical emergency, call 911.   ADDITIONAL HEALTHCARE OPTIONS FOR PATIENTS  [facility] / e-Visit: [email or web address]         [facility]: [phone]  [facility]: [phone]                   [facility]: [phone]   

## 2018-07-08 ENCOUNTER — Other Ambulatory Visit: Payer: Self-pay

## 2018-07-08 ENCOUNTER — Encounter: Payer: Medicaid Other | Admitting: Obstetrics and Gynecology

## 2018-07-08 ENCOUNTER — Ambulatory Visit (INDEPENDENT_AMBULATORY_CARE_PROVIDER_SITE_OTHER): Payer: Medicaid Other | Admitting: Obstetrics and Gynecology

## 2018-07-08 VITALS — BP 115/72 | HR 104 | Wt 215.4 lb

## 2018-07-08 DIAGNOSIS — Z3493 Encounter for supervision of normal pregnancy, unspecified, third trimester: Secondary | ICD-10-CM

## 2018-07-08 LAB — POCT URINALYSIS DIPSTICK OB
Bilirubin, UA: NEGATIVE
Blood, UA: NEGATIVE
Glucose, UA: NEGATIVE
Ketones, UA: NEGATIVE
Leukocytes, UA: NEGATIVE
Nitrite, UA: NEGATIVE
POC,PROTEIN,UA: NEGATIVE
SPEC GRAV UA: 1.015 (ref 1.010–1.025)
Urobilinogen, UA: 0.2 E.U./dL
pH, UA: 6 (ref 5.0–8.0)

## 2018-07-08 NOTE — Progress Notes (Signed)
ROB- for IOL tomorrow, discussed PP BC and desires Mirena IUD-info given to review,

## 2018-07-08 NOTE — Patient Instructions (Signed)
                                                                                                                 FREQUENTLY ASKED QUESTIONS FOR OBSTETRICS/PEDIATRICS    Q: Why are visitor restrictions different for maternity care areas?  [facility] is restricting visitors for the duration of the patient's hospitalization. The birth of a child involves the mother, considered the patient, and a birthing partner. These are unprecedented times and we are making the exception to allow a birthing partner to be a part of the patient unit. No other guests will be allowed in our [facility] at [facility] and at [facility].   Q: Are credentialed doulas allowed to support their existing patients?  We acknowledge the value these doula partnerships offer our care teams and many birthing families in our communities. Each laboring mother is allowed one birthing partner of the patient's choosing for her entire hospitalization.   Q: Are visitor restrictions different for hospitalized children?  Pediatric patients (infants and children under 17 years of age), such as those in the Children's Unit, Pediatric ICU and NICU, will be allowed two visitors (parents or legal guardians)   Q: Are pregnant women at an increased risk for COVID-19?  The American College of Obstetricians and Gynecologists (ACOG) is monitoring closely the coronavirus pandemic. With the limited information available, data does not indicate pregnant women are at an increased risk. However, pregnant women are known to be at greater risk for respiratory infections like flu. With that in mind, expectant mothers are considered an at-risk population for COVID-19, according to ACOG.   Q: Are newborns at an increased risk for COVID-19?  A limited sample of COVID-19 data with newborns indicates the virus is not transferred to the infant during pregnancy. However, postpartum separation is recommended by the Centers for  Disease Control (CDC). As a result [facility] recommends and strongly encourages temporary separation of moms and babies who test positive for COVID-19 or are awaiting results to rule out COVID-19 based on CDC guidelines.   Q: If you have a suspected case of COVID-19, is the NICU couplet care room an option?  No. If either patient is considered at-risk for having COVID-19, the [facility] at [facility] will not use the NICU couplet care rooms for that family.   Q: [facility] is urging that elective procedures be postponed. What is considered elective for women's and children's service line?  NOT ELECTIVE: Obstetric procedures, even those with an element of choice on timing, are not considered elective. Circumcisions are considered elective procedures, however, these do not deplete blood products and other resources, which is the spirit in which the COVID-19 postponement of elective procedures was intended. Therefore, circumcisions will be allowed.   ELECTIVE: Postpartum tubal ligations are considered elective and should be postponed. Q&A for Obstetricians, Gynecologists and Pediatricians  Published July 02, 2018   [facility] supports as much as possible the medical care   team working with the patient's individual needs to address timing during these unprecedented times. We seek the support of our medical care team in preserving needed resources throughout our crisis response to COVID-19.   Q: How does COVID-19 impact breastfeeding?  Breastmilk is safe for your baby - even if the mother has tested positive for COVID-19. If a COVID-19+ mother decides to breastfeed while inpatient and after discharge, we suggest proper protective equipment be worn and hand hygiene be performed before and after feeding the infant. The new mother also has the option to pump her milk and have a healthy family member feed the baby to protect the baby from getting the virus.   Q: Should we urge  patients to avoid baby showers and large gatherings?  Yes. As has been recommended for all citizens in our communities, gatherings of 10 or more should be avoided - pregnant or not. Seek creative options for "hosting" baby showers through electronic means that honor the request for social distancing during this time of heightened awareness.   Q: Should patients miss their prenatal appointments?  No. Prenatal visits are NOT elective. While we want to limit contact and exposure, prenatal care is vital right now. Contact your physician's office if you have concerns about your visits. We are limiting outpatient office visits to the patient and one guest in order to reduce the potential for exposure.   Q: What if a pregnant woman feels sick? Should she miss her prenatal visit then?  A pregnant woman experiencing coronavirus-like symptoms (i.e., cough, fever, difficulty breathing, shortness of breath, gastrointestinal issues) should contact her pregnancy care provider by phone. Her medical professional can best determine whether she should use a video visit or possibly go to a collection site to be tested for COVID-19. Contacting her primary care provider or her pregnancy care provider is her first step.   Q: What can I do about childbirth education? All the classes are cancelled.  The [facility] Centers will offer online learning to support mothers on their journey. We currently offer Understanding Childbirth, Understanding Breastfeeding and Understanding Newborn Care as an online class. Please visit our website, [email or web address], to register for an online class.   Q: How can I keep from getting COVID-19? Q&A for Obstetricians, Gynecologists and Pediatricians  Published July 02, 2018   Together, we can reduce the risk of exposure to the virus and help you and your family remain healthy and safe. One of the best ways to protect yourself is to wash your hands frequently using soap and  water. Also, you should avoid touching your eyes, nose and mouth with unwashed hands, avoid physical contact with others and practice social distancing.   Q: How are employees being informed about what to do?  [facility] leaders receive a daily COVID-19 update and share relevant information with their teams. This is a time when health care professionals are called on to lead within our community. We appreciate our staff's engagement with our COVID-19 updates and encourage them to share best practices on reducing the spread of the virus with our patients and community. We are prepared to provide the exceptional COVID-19 care and coordination our community needs, expects and deserves.   Q: Who's in charge of this issue at [facility]?  The leadership structure and process established to address COVID-19 includes Chief Physician Executive Bruce Swords, MD; Infection Prevention Medical Director Cynthia Snider, MD; and Infection Prevention Interim Director Sara Wall, MSN, RN, CIC, CSPDT. A team   of [facility] experts reflecting a broad spectrum of our workforce is meeting daily to evaluate new information we receive about COVID-19 and to adapt policies and practices accordingly.                         Published July 02, 2018    

## 2018-07-08 NOTE — Progress Notes (Signed)
ROB- pt is having a lot of pelvic pressure, menstrual cramping

## 2018-07-09 ENCOUNTER — Inpatient Hospital Stay: Payer: Medicaid Other | Admitting: Registered Nurse

## 2018-07-09 ENCOUNTER — Inpatient Hospital Stay
Admission: EM | Admit: 2018-07-09 | Discharge: 2018-07-10 | DRG: 807 | Disposition: A | Discharge status: Home / Self Care | Payer: Medicaid Other | Attending: Certified Nurse Midwife | Admitting: Certified Nurse Midwife

## 2018-07-09 DIAGNOSIS — E669 Obesity, unspecified: Secondary | ICD-10-CM | POA: Diagnosis present

## 2018-07-09 DIAGNOSIS — O99824 Streptococcus B carrier state complicating childbirth: Secondary | ICD-10-CM | POA: Diagnosis not present

## 2018-07-09 DIAGNOSIS — Z87891 Personal history of nicotine dependence: Secondary | ICD-10-CM | POA: Diagnosis not present

## 2018-07-09 DIAGNOSIS — J45909 Unspecified asthma, uncomplicated: Secondary | ICD-10-CM | POA: Diagnosis present

## 2018-07-09 DIAGNOSIS — O9952 Diseases of the respiratory system complicating childbirth: Secondary | ICD-10-CM | POA: Diagnosis present

## 2018-07-09 DIAGNOSIS — O99214 Obesity complicating childbirth: Principal | ICD-10-CM | POA: Diagnosis present

## 2018-07-09 DIAGNOSIS — Z3A39 39 weeks gestation of pregnancy: Secondary | ICD-10-CM

## 2018-07-09 DIAGNOSIS — Z3490 Encounter for supervision of normal pregnancy, unspecified, unspecified trimester: Secondary | ICD-10-CM | POA: Diagnosis present

## 2018-07-09 LAB — CBC
HCT: 40.5 % (ref 36.0–46.0)
Hemoglobin: 11.8 g/dL — ABNORMAL LOW (ref 12.0–15.0)
MCH: 31.1 pg (ref 26.0–34.0)
MCHC: 29.1 g/dL — ABNORMAL LOW (ref 30.0–36.0)
MCV: 106.6 fL — ABNORMAL HIGH (ref 80.0–100.0)
Platelets: 145 10*3/uL — ABNORMAL LOW (ref 150–400)
RBC: 3.8 MIL/uL — ABNORMAL LOW (ref 3.87–5.11)
RDW: 16.9 % — AB (ref 11.5–15.5)
WBC: 10.5 10*3/uL (ref 4.0–10.5)
nRBC: 0 % (ref 0.0–0.2)

## 2018-07-09 LAB — TYPE AND SCREEN
ABO/RH(D): O POS
Antibody Screen: NEGATIVE

## 2018-07-09 MED ORDER — ONDANSETRON HCL 4 MG/2ML IJ SOLN: 4.0000 mg | Freq: Four times a day (QID) | INTRAMUSCULAR | Status: DC | PRN

## 2018-07-09 MED ORDER — DIPHENHYDRAMINE HCL 25 MG PO CAPS: 25.0000 mg | ORAL_CAPSULE | Freq: Four times a day (QID) | ORAL | Status: DC | PRN

## 2018-07-09 MED ORDER — BUTORPHANOL TARTRATE 2 MG/ML IJ SOLN
1.0000 mg | INTRAMUSCULAR | Status: DC | PRN
  Administered 2018-07-09: 1 mg via INTRAVENOUS

## 2018-07-09 MED ORDER — LIDOCAINE-EPINEPHRINE (PF) 1.5 %-1:200000 IJ SOLN
INTRAMUSCULAR | Status: DC | PRN
  Administered 2018-07-09: 3 mL via EPIDURAL

## 2018-07-09 MED ORDER — OXYTOCIN 40 UNITS IN NORMAL SALINE INFUSION - SIMPLE MED
INTRAVENOUS | Status: AC
  Administered 2018-07-09: 18:00:00
  Filled 2018-07-09: qty 1000

## 2018-07-09 MED ORDER — BUTORPHANOL TARTRATE 2 MG/ML IJ SOLN
INTRAMUSCULAR | Status: AC
  Filled 2018-07-09: qty 1

## 2018-07-09 MED ORDER — MISOPROSTOL 200 MCG PO TABS
ORAL_TABLET | ORAL | Status: AC
  Filled 2018-07-09: qty 4

## 2018-07-09 MED ORDER — SODIUM CHLORIDE 0.9% FLUSH: 3.0000 mL | INTRAVENOUS | Status: DC | PRN

## 2018-07-09 MED ORDER — FENTANYL CITRATE (PF) 100 MCG/2ML IJ SOLN: 50.0000 ug | INTRAMUSCULAR | Status: DC | PRN

## 2018-07-09 MED ORDER — PRENATAL MULTIVITAMIN CH
1.0000 | ORAL_TABLET | Freq: Every day | ORAL | Status: DC
  Administered 2018-07-10: 1 via ORAL
  Filled 2018-07-09: qty 1

## 2018-07-09 MED ORDER — ONDANSETRON HCL 4 MG/2ML IJ SOLN: 4.0000 mg | INTRAMUSCULAR | Status: DC | PRN

## 2018-07-09 MED ORDER — ONDANSETRON HCL 4 MG PO TABS: 4.0000 mg | ORAL_TABLET | ORAL | Status: DC | PRN

## 2018-07-09 MED ORDER — BUPIVACAINE HCL (PF) 0.25 % IJ SOLN
INTRAMUSCULAR | Status: DC | PRN
  Administered 2018-07-09: 4 mL via EPIDURAL
  Administered 2018-07-09: 3 mL via EPIDURAL

## 2018-07-09 MED ORDER — BENZOCAINE-MENTHOL 20-0.5 % EX AERO
1.0000 "application " | INHALATION_SPRAY | CUTANEOUS | Status: DC | PRN
  Administered 2018-07-09 – 2018-07-10 (×2): 1 via TOPICAL
  Filled 2018-07-09 (×2): qty 56

## 2018-07-09 MED ORDER — SOD CITRATE-CITRIC ACID 500-334 MG/5ML PO SOLN: 30.0000 mL | ORAL | Status: DC | PRN

## 2018-07-09 MED ORDER — TERBUTALINE SULFATE 1 MG/ML IJ SOLN: 0.2500 mg | Freq: Once | INTRAMUSCULAR | Status: DC | PRN

## 2018-07-09 MED ORDER — ACETAMINOPHEN 325 MG PO TABS: 650.0000 mg | ORAL_TABLET | ORAL | Status: DC | PRN

## 2018-07-09 MED ORDER — SODIUM CHLORIDE 0.9 % IV SOLN: 250.0000 mL | INTRAVENOUS | Status: DC | PRN

## 2018-07-09 MED ORDER — LIDOCAINE HCL (PF) 1 % IJ SOLN: 30.0000 mL | INTRAMUSCULAR | Status: DC | PRN

## 2018-07-09 MED ORDER — OXYTOCIN 40 UNITS IN NORMAL SALINE INFUSION - SIMPLE MED
1.0000 m[IU]/min | INTRAVENOUS | Status: DC
  Administered 2018-07-09: 2 m[IU]/min via INTRAVENOUS
  Filled 2018-07-09: qty 1000

## 2018-07-09 MED ORDER — SODIUM CHLORIDE 0.9 % IV SOLN
1.0000 g | INTRAVENOUS | Status: DC
  Administered 2018-07-09: 1 g via INTRAVENOUS
  Filled 2018-07-09: qty 1000

## 2018-07-09 MED ORDER — DIBUCAINE 1 % RE OINT: 1.0000 "application " | TOPICAL_OINTMENT | RECTAL | Status: DC | PRN

## 2018-07-09 MED ORDER — IBUPROFEN 600 MG PO TABS
600.0000 mg | ORAL_TABLET | Freq: Four times a day (QID) | ORAL | Status: DC
  Administered 2018-07-09 – 2018-07-10 (×4): 600 mg via ORAL
  Filled 2018-07-09 (×4): qty 1

## 2018-07-09 MED ORDER — SODIUM CHLORIDE 0.9% FLUSH: 3.0000 mL | Freq: Two times a day (BID) | INTRAVENOUS | Status: DC

## 2018-07-09 MED ORDER — OXYTOCIN BOLUS FROM INFUSION
500.0000 mL | Freq: Once | INTRAVENOUS | Status: AC
  Administered 2018-07-09: 500 mL via INTRAVENOUS

## 2018-07-09 MED ORDER — OXYTOCIN 40 UNITS IN NORMAL SALINE INFUSION - SIMPLE MED: 2.5000 [IU]/h | INTRAVENOUS | Status: DC

## 2018-07-09 MED ORDER — FENTANYL 2.5 MCG/ML W/ROPIVACAINE 0.15% IN NS 100 ML EPIDURAL (ARMC)
EPIDURAL | Status: DC | PRN
  Administered 2018-07-09: 12 mL/h via EPIDURAL

## 2018-07-09 MED ORDER — SENNOSIDES-DOCUSATE SODIUM 8.6-50 MG PO TABS
2.0000 | ORAL_TABLET | ORAL | Status: DC
  Administered 2018-07-10: 2 via ORAL
  Filled 2018-07-09: qty 2

## 2018-07-09 MED ORDER — ACETAMINOPHEN 325 MG PO TABS
ORAL_TABLET | ORAL | Status: AC
  Filled 2018-07-09: qty 2

## 2018-07-09 MED ORDER — LIDOCAINE HCL (PF) 1 % IJ SOLN
INTRAMUSCULAR | Status: DC | PRN
  Administered 2018-07-09: 3 mL via SUBCUTANEOUS

## 2018-07-09 MED ORDER — ACETAMINOPHEN 325 MG PO TABS
650.0000 mg | ORAL_TABLET | ORAL | Status: DC | PRN
  Administered 2018-07-09 – 2018-07-10 (×4): 650 mg via ORAL
  Filled 2018-07-09 (×3): qty 2

## 2018-07-09 MED ORDER — OXYTOCIN 10 UNIT/ML IJ SOLN
INTRAMUSCULAR | Status: AC
  Filled 2018-07-09: qty 2

## 2018-07-09 MED ORDER — FENTANYL 2.5 MCG/ML W/ROPIVACAINE 0.15% IN NS 100 ML EPIDURAL (ARMC)
EPIDURAL | Status: AC
  Filled 2018-07-09: qty 100

## 2018-07-09 MED ORDER — SIMETHICONE 80 MG PO CHEW: 80.0000 mg | CHEWABLE_TABLET | ORAL | Status: DC | PRN

## 2018-07-09 MED ORDER — ALBUTEROL SULFATE HFA 108 (90 BASE) MCG/ACT IN AERS
2.0000 | INHALATION_SPRAY | Freq: Four times a day (QID) | RESPIRATORY_TRACT | Status: DC | PRN
  Filled 2018-07-09: qty 6.7

## 2018-07-09 MED ORDER — AMMONIA AROMATIC IN INHA
RESPIRATORY_TRACT | Status: AC
  Filled 2018-07-09: qty 10

## 2018-07-09 MED ORDER — LACTATED RINGERS IV SOLN
INTRAVENOUS | Status: DC
  Administered 2018-07-09: 06:00:00 via INTRAVENOUS

## 2018-07-09 MED ORDER — WITCH HAZEL-GLYCERIN EX PADS: 1.0000 "application " | MEDICATED_PAD | CUTANEOUS | Status: DC | PRN

## 2018-07-09 MED ORDER — LIDOCAINE HCL (PF) 1 % IJ SOLN
INTRAMUSCULAR | Status: AC
  Filled 2018-07-09: qty 30

## 2018-07-09 MED ORDER — COCONUT OIL OIL: 1.0000 "application " | TOPICAL_OIL | Status: DC | PRN

## 2018-07-09 MED ORDER — SODIUM CHLORIDE 0.9 % IV SOLN
2.0000 g | Freq: Once | INTRAVENOUS | Status: AC
  Administered 2018-07-09: 2 g via INTRAVENOUS
  Filled 2018-07-09: qty 2000

## 2018-07-09 MED ORDER — LACTATED RINGERS IV SOLN
500.0000 mL | INTRAVENOUS | Status: DC | PRN
  Administered 2018-07-09: 500 mL via INTRAVENOUS

## 2018-07-09 NOTE — Anesthesia Preprocedure Evaluation (Signed)
Anesthesia Evaluation  Patient identified by MRN, date of birth, ID band Patient awake    Reviewed: Allergy & Precautions, H&P , NPO status , Patient's Chart, lab work & pertinent test results  Airway Mallampati: II  TM Distance: >3 FB Neck ROM: full    Dental  (+) Teeth Intact   Pulmonary asthma , former smoker,           Cardiovascular      Neuro/Psych PSYCHIATRIC DISORDERS Depression negative neurological ROS     GI/Hepatic Neg liver ROS, GERD  ,  Endo/Other  negative endocrine ROS  Renal/GU negative Renal ROS  negative genitourinary   Musculoskeletal   Abdominal   Peds  Hematology negative hematology ROS (+)   Anesthesia Other Findings   Reproductive/Obstetrics (+) Pregnancy                             Anesthesia Physical Anesthesia Plan  ASA: II  Anesthesia Plan: Epidural   Post-op Pain Management:    Induction:   PONV Risk Score and Plan:   Airway Management Planned:   Additional Equipment:   Intra-op Plan:   Post-operative Plan:   Informed Consent: I have reviewed the patients History and Physical, chart, labs and discussed the procedure including the risks, benefits and alternatives for the proposed anesthesia with the patient or authorized representative who has indicated his/her understanding and acceptance.     Dental Advisory Given  Plan Discussed with: Anesthesiologist and CRNA  Anesthesia Plan Comments:         Anesthesia Quick Evaluation

## 2018-07-09 NOTE — H&P (Signed)
Obstetric History and Physical  Christina Warner is a 23 y.o. G2P1001 with IUP at [redacted]w[redacted]d presenting for induction of labor due to maternal obesity.   Patient states she has been having regular, every two (2) to four (4) minutes contractions, none vaginal bleeding, intact membranes, with active fetal movement.   Denies difficulty breathing or respiratory distress, chest pain, vaginal bleeding, dysuria and leg pain or swelling.   Prenatal Course  Source of Care: [facility]-initial visit at 10 wks, total visits: 15  Pregnancy complications or risks: Obesity in pregnancy, ADHD, Asthma-occasional inhaler use, History of forceps assisted birth, Rh positive, Smoker  Prenatal labs and studies:  ABO, Rh: --/--/O POS (03/27 0531)  Antibody: NEG (03/27 0531)  Rubella: 10.00 (08/19 1034)  Varicella: Immune (08/19 1034)  RPR: Non Reactive (12/31 0911)   HBsAg: Negative (08/19 1034)   HIV: Non Reactive (08/19 1034)   VHQ:IONGEXBM (03/04 1651)  1 hr Glucola: 97 (10/31 0919)  1 hr Glucola: 156 (12/31 0911)  Gestational Glucose Tolerance: 81, 187, 132, 60 (01/16 0840)  Genetic screening: Declined  Anatomy US: Normal, complete, due date updated (10/31 8413)  Past Medical History:  Diagnosis Date  . ADHD (attention deficit hyperactivity disorder)   . Asthma   . Depression     Past Surgical History:  Procedure Laterality Date  . TYMPANOSTOMY TUBE PLACEMENT      OB History  Gravida Para Term Preterm AB Living  2 1 1     1   SAB TAB Ectopic Multiple Live Births        0 1    # Outcome Date GA Lbr Len/2nd Weight Sex Delivery Anes PTL Lv  2 Current           1 Term 06/01/14 [redacted]w[redacted]d  2980 g F Vag-Spont EPI  LIV     Birth Comments: WNL    Social History   Socioeconomic History  . Marital status: Married    Spouse name: Bertram Gala  . Number of children: 1  . Years of education: Not on file  . Highest education level: Not on file  Occupational History  . Not on file  Social  Needs  . Financial resource strain: Not hard at all  . Food insecurity:    Worry: Never true    Inability: Never true  . Transportation needs:    Medical: No    Non-medical: No  Tobacco Use  . Smoking status: Former Smoker    Packs/day: 0.25    Types: Cigarettes    Last attempt to quit: 10/26/2017    Years since quitting: 0.7  . Smokeless tobacco: Never Used  Substance and Sexual Activity  . Alcohol use: No  . Drug use: Not Currently  . Sexual activity: Yes    Birth control/protection: None    Comment: undecided  Lifestyle  . Physical activity:    Days per week: 0 days    Minutes per session: 0 min  . Stress: Very much  Relationships  . Social connections:    Talks on phone: More than three times a week    Gets together: More than three times a week    Attends religious service: Never    Active member of club or organization: No    Attends meetings of clubs or organizations: Never    Relationship status: Married  Other Topics Concern  . Not on file  Social History Narrative  . Not on file    Family History  Problem Relation  Age of Onset  . Depression Mother   . Anxiety disorder Mother   . Depression Sister     Medications Prior to Admission  Medication Sig Dispense Refill Last Dose  . albuterol (PROVENTIL HFA;VENTOLIN HFA) 108 (90 Base) MCG/ACT inhaler Inhale 2 puffs into the lungs every 6 (six) hours as needed for wheezing or shortness of breath. 1 Inhaler 2 Past Month at Unknown time  . DICLEGIS 10-10 MG TBEC Take 1 tablet by mouth 3 (three) times daily. Take 1 tablet at bedtime, 1 tablet mid morning and 1 tablet mid afternoon 90 tablet 5 07/08/2018 at Unknown time  . Prenatal Vit-Fe Fumarate-FA (PRENATAL MULTIVITAMIN) TABS tablet Take 1 tablet by mouth daily at 12 noon.   07/08/2018 at Unknown time    Allergies  Allergen Reactions  . Motrin [Ibuprofen]     Review of Systems: Negative except for what is mentioned in HPI.  Physical Exam:  Temp:  [97.6 F  (36.4 C)-98.2 F (36.8 C)] 97.7 F (36.5 C) (03/27 1000) Pulse Rate:  [86-100] 91 (03/27 1205) Resp:  [16-18] 16 (03/27 0720) BP: (95-134)/(62-87) 122/64 (03/27 1205) SpO2:  [98 %-100 %] 99 % (03/27 1205) Weight:  [97.5 kg] 97.5 kg (03/27 0504)  GENERAL: Well-developed, well-nourished female in no acute distress.   LUNGS: Clear to auscultation bilaterally.   HEART: Regular rate and rhythm.  ABDOMEN: Soft, nontender, nondistended, gravid.  EXTREMITIES: Nontender, no edema, 2+ distal pulses.  Cervical Exam: Dilation: 6.5 Effacement (%): 100 Station: -1 Presentation: Vertex Exam by:: Lawhorn CNM   AROM moderate amount, clear fluid  FHT:  Baseline rate 125 bpm   Variability moderate  Accelerations present   Decelerations variable, early  Contractions: Every two (2) to four (4) minutes, pitocin infusing at 10 mu/min   Pertinent Labs/Studies:   Results for orders placed or performed during the hospital encounter of 07/09/18 (from the past 24 hour(s))  CBC     Status: Abnormal   Collection Time: 07/09/18  5:31 AM  Result Value Ref Range   WBC 10.5 4.0 - 10.5 K/uL   RBC 3.80 (L) 3.87 - 5.11 MIL/uL   Hemoglobin 11.8 (L) 12.0 - 15.0 g/dL   HCT 37.6 28.3 - 15.1 %   MCV 106.6 (H) 80.0 - 100.0 fL   MCH 31.1 26.0 - 34.0 pg   MCHC 29.1 (L) 30.0 - 36.0 g/dL   RDW 76.1 (H) 60.7 - 37.1 %   Platelets 145 (L) 150 - 400 K/uL   nRBC 0.0 0.0 - 0.2 %  Type and screen     Status: None   Collection Time: 07/09/18  5:31 AM  Result Value Ref Range   ABO/RH(D) O POS    Antibody Screen NEG    Sample Expiration      07/12/2018 Performed at [facility], [address] [place], [place] [address]     Assessment :  Christina Warner is a 23 y.o. G2P1001 at [redacted]w[redacted]d being admitted for induction of labor due to obesity in pregnancy,  ADHD-no medications, Asthma-occasional inhaler use, History of forceps assisted birth, Rh positive, Smoker  FHR Category II   Plan:  Admit to birthing  suites, see orders.   Induction/Augmentation as needed, per protocol.  Delivery plan: Hopeful for vaginal delivery.   Room prepared for second stage.   Dr. Logan Bores notified of admission.    Gunnar Bulla, CNM [facility], [facility] 07/09/18 12:18 PM

## 2018-07-09 NOTE — Progress Notes (Signed)
Patient ID: Christina Warner, female   DOB: 08/01/1995, 23 y.o.   MRN: [id number]  Called by RN to assess patient's postpartum bleeding. Reports continuous small trickle from first degree perineal laceration.   Laceration examined and bleeding continuous light bleeding confirmed. First degree perineal laceration repaired with 3-0 vicryl rapide under local anesthesia using sterile technique. Laceration hemostatic and well approximated.   Reviewed red flag symptoms and when to call. Continue orders as written. Reassess as needed.    Gunnar Bulla, CNM [facility], [facility] 07/09/18 3:18 PM

## 2018-07-09 NOTE — Anesthesia Procedure Notes (Signed)
Epidural Patient location during procedure: OB Start time: 07/09/2018 11:26 AM End time: 07/09/2018 11:35 AM  Staffing Anesthesiologist: Naomie Dean, MD Resident/CRNA: Karoline Caldwell, CRNA Performed: resident/CRNA   Preanesthetic Checklist Completed: patient identified, site marked, surgical consent, pre-op evaluation, timeout performed, IV checked, risks and benefits discussed and monitors and equipment checked  Epidural Patient position: sitting Prep: ChloraPrep Patient monitoring: heart rate, continuous pulse ox and blood pressure Approach: midline Location: L3-L4 Injection technique: LOR saline  Needle:  Needle type: Tuohy  Needle gauge: 17 G Needle length: 9 cm and 9 Needle insertion depth: 6 cm Catheter type: closed end flexible Catheter size: 19 Gauge Catheter at skin depth: 12 cm Test dose: negative and 1.5% lidocaine with Epi 1:200 K  Assessment Events: blood not aspirated, injection not painful, no injection resistance, negative IV test and no paresthesia  Additional Notes 1 attempt Pt. Evaluated and documentation done after procedure finished. Patient identified. Risks/Benefits/Options discussed with patient including but not limited to bleeding, infection, nerve damage, paralysis, failed block, incomplete pain control, headache, blood pressure changes, nausea, vomiting, reactions to medication both or allergic, itching and postpartum back pain. Confirmed with bedside nurse the patient's most recent platelet count. Confirmed with patient that they are not currently taking any anticoagulation, have any bleeding history or any family history of bleeding disorders. Patient expressed understanding and wished to proceed. All questions were answered. Sterile technique was used throughout the entire procedure. Please see nursing notes for vital signs. Test dose was given through epidural catheter and negative prior to continuing to dose epidural or start infusion. Warning  signs of high block given to the patient including shortness of breath, tingling/numbness in hands, complete motor block, or any concerning symptoms with instructions to call for help. Patient was given instructions on fall risk and not to get out of bed. All questions and concerns addressed with instructions to call with any issues or inadequate analgesia.   Patient tolerated the insertion well without immediate complications.Reason for block:procedure for pain

## 2018-07-10 ENCOUNTER — Ambulatory Visit: Payer: Self-pay

## 2018-07-10 LAB — RPR: RPR Ser Ql: NONREACTIVE

## 2018-07-10 LAB — CBC
HCT: 32 % — ABNORMAL LOW (ref 36.0–46.0)
Hemoglobin: 10.4 g/dL — ABNORMAL LOW (ref 12.0–15.0)
MCH: 29.2 pg (ref 26.0–34.0)
MCHC: 32.5 g/dL (ref 30.0–36.0)
MCV: 89.9 fL (ref 80.0–100.0)
Platelets: 172 10*3/uL (ref 150–400)
RBC: 3.56 MIL/uL — ABNORMAL LOW (ref 3.87–5.11)
RDW: 13.4 % (ref 11.5–15.5)
WBC: 10.7 10*3/uL — ABNORMAL HIGH (ref 4.0–10.5)
nRBC: 0 % (ref 0.0–0.2)

## 2018-07-10 MED ORDER — SIMETHICONE 80 MG PO CHEW: 80.0000 mg | CHEWABLE_TABLET | ORAL | 0 refills | Status: DC | PRN

## 2018-07-10 MED ORDER — FERROUS SULFATE 325 (65 FE) MG PO TABS: 325.0000 mg | ORAL_TABLET | Freq: Every day | ORAL | 3 refills | Status: DC

## 2018-07-10 MED ORDER — IBUPROFEN 600 MG PO TABS: 600.0000 mg | ORAL_TABLET | Freq: Four times a day (QID) | ORAL | 0 refills | Status: DC

## 2018-07-10 NOTE — Plan of Care (Signed)
Vs stable; up ad lib; taking motrin and tylenol for pain control; breastfeeding and at times does need assistance; pt has asked about being discharged later today

## 2018-07-10 NOTE — Progress Notes (Signed)
Discharge instructions given. Patient verbalizes understanding of teaching. Patient discharged home via wheelchair at 1515. 

## 2018-07-10 NOTE — Lactation Note (Signed)
This note was copied from a baby's chart. Lactation Consultation Note  Patient Name: Christina Warner [id number] Date: 07/10/2018    Assisted mom with breast feeding on right breast in football hold with pillow support for comfort.  Mom reports left nipple getting sore because [facility] only wants to breast feed on left side.  No trauma noted.  Demonstrated how to hand express.  Was able to get [facility] to latch with minimal assistance with better positioning.  Mom reports attempting to breast feed her first baby without success, because could never get latched well.  Reviewed supply and demand, normal course of lactation and routine newborn feeding patterns.  Mom wanting to be discharged.  Contact numbers given to call with further questions or concerns. Maternal Data    Feeding    LATCH Score                   Interventions    Lactation Tools Discussed/Used     Consult Status      Louis Meckel 07/10/2018, 9:38 PM

## 2018-07-10 NOTE — Progress Notes (Signed)
Period of purple cry video shown to patient and significant other.

## 2018-07-10 NOTE — Discharge Instructions (Signed)
Breastfeeding ° °Choosing to breastfeed is one of the best decisions you can make for yourself and your baby. A change in hormones during pregnancy causes your breasts to make breast milk in your milk-producing glands. Hormones prevent breast milk from being released before your baby is born. They also prompt milk flow after birth. Once breastfeeding has begun, thoughts of your baby, as well as his or her sucking or crying, can stimulate the release of milk from your milk-producing glands. °Benefits of breastfeeding °Research shows that breastfeeding offers many health benefits for infants and mothers. It also offers a cost-free and convenient way to feed your baby. °For your baby °· Your first milk (colostrum) helps your baby's digestive system to function better. °· Special cells in your milk (antibodies) help your baby to fight off infections. °· Breastfed babies are less likely to develop asthma, allergies, obesity, or type 2 diabetes. They are also at lower risk for sudden infant death syndrome (SIDS). °· Nutrients in breast milk are better able to meet your baby’s needs compared to infant formula. °· Breast milk improves your baby's brain development. °For you °· Breastfeeding helps to create a very special bond between you and your baby. °· Breastfeeding is convenient. Breast milk costs nothing and is always available at the correct temperature. °· Breastfeeding helps to burn calories. It helps you to lose the weight that you gained during pregnancy. °· Breastfeeding makes your uterus return faster to its size before pregnancy. It also slows bleeding (lochia) after you give birth. °· Breastfeeding helps to lower your risk of developing type 2 diabetes, osteoporosis, rheumatoid arthritis, cardiovascular disease, and breast, ovarian, uterine, and endometrial cancer later in life. °Breastfeeding basics °Starting breastfeeding °· Find a comfortable place to sit or lie down, with your neck and back  well-supported. °· Place a pillow or a rolled-up blanket under your baby to bring him or her to the level of your breast (if you are seated). Nursing pillows are specially designed to help support your arms and your baby while you breastfeed. °· Make sure that your baby's tummy (abdomen) is facing your abdomen. °· Gently massage your breast. With your fingertips, massage from the outer edges of your breast inward toward the nipple. This encourages milk flow. If your milk flows slowly, you may need to continue this action during the feeding. °· Support your breast with 4 fingers underneath and your thumb above your nipple (make the letter "C" with your hand). Make sure your fingers are well away from your nipple and your baby’s mouth. °· Stroke your baby's lips gently with your finger or nipple. °· When your baby's mouth is open wide enough, quickly bring your baby to your breast, placing your entire nipple and as much of the areola as possible into your baby's mouth. The areola is the colored area around your nipple. °? More areola should be visible above your baby's upper lip than below the lower lip. °? Your baby's lips should be opened and extended outward (flanged) to ensure an adequate, comfortable latch. °? Your baby's tongue should be between his or her lower gum and your breast. °· Make sure that your baby's mouth is correctly positioned around your nipple (latched). Your baby's lips should create a seal on your breast and be turned out (everted). °· It is common for your baby to suck about 2-3 minutes in order to start the flow of breast milk. °Latching °Teaching your baby how to latch onto your breast properly is   very important. An improper latch can cause nipple pain, decreased milk supply, and poor weight gain in your baby. Also, if your baby is not latched onto your nipple properly, he or she may swallow some air during feeding. This can make your baby fussy. Burping your baby when you switch breasts  during the feeding can help to get rid of the air. However, teaching your baby to latch on properly is still the best way to prevent fussiness from swallowing air while breastfeeding. °Signs that your baby has successfully latched onto your nipple °· Silent tugging or silent sucking, without causing you pain. Infant's lips should be extended outward (flanged). °· Swallowing heard between every 3-4 sucks once your milk has started to flow (after your let-down milk reflex occurs). °· Muscle movement above and in front of his or her ears while sucking. °Signs that your baby has not successfully latched onto your nipple °· Sucking sounds or smacking sounds from your baby while breastfeeding. °· Nipple pain. °If you think your baby has not latched on correctly, slip your finger into the corner of your baby’s mouth to break the suction and place it between your baby's gums. Attempt to start breastfeeding again. °Signs of successful breastfeeding °Signs from your baby °· Your baby will gradually decrease the number of sucks or will completely stop sucking. °· Your baby will fall asleep. °· Your baby's body will relax. °· Your baby will retain a small amount of milk in his or her mouth. °· Your baby will let go of your breast by himself or herself. °Signs from you °· Breasts that have increased in firmness, weight, and size 1-3 hours after feeding. °· Breasts that are softer immediately after breastfeeding. °· Increased milk volume, as well as a change in milk consistency and color by the fifth day of breastfeeding. °· Nipples that are not sore, cracked, or bleeding. °Signs that your baby is getting enough milk °· Wetting at least 1-2 diapers during the first 24 hours after birth. °· Wetting at least 5-6 diapers every 24 hours for the first week after birth. The urine should be clear or pale yellow by the age of 5 days. °· Wetting 6-8 diapers every 24 hours as your baby continues to grow and develop. °· At least 3 stools in  a 24-hour period by the age of 5 days. The stool should be soft and yellow. °· At least 3 stools in a 24-hour period by the age of 7 days. The stool should be seedy and yellow. °· No loss of weight greater than 10% of birth weight during the first 3 days of life. °· Average weight gain of 4-7 oz (113-198 g) per week after the age of 4 days. °· Consistent daily weight gain by the age of 5 days, without weight loss after the age of 2 weeks. °After a feeding, your baby may spit up a small amount of milk. This is normal. °Breastfeeding frequency and duration °Frequent feeding will help you make more milk and can prevent sore nipples and extremely full breasts (breast engorgement). Breastfeed when you feel the need to reduce the fullness of your breasts or when your baby shows signs of hunger. This is called "breastfeeding on demand." Signs that your baby is hungry include: °· Increased alertness, activity, or restlessness. °· Movement of the head from side to side. °· Opening of the mouth when the corner of the mouth or cheek is stroked (rooting). °· Increased sucking sounds, smacking lips, cooing,   sighing, or squeaking. °· Hand-to-mouth movements and sucking on fingers or hands. °· Fussing or crying. °Avoid introducing a pacifier to your baby in the first 4-6 weeks after your baby is born. After this time, you may choose to use a pacifier. Research has shown that pacifier use during the first year of a baby's life decreases the risk of sudden infant death syndrome (SIDS). °Allow your baby to feed on each breast as long as he or she wants. When your baby unlatches or falls asleep while feeding from the first breast, offer the second breast. Because newborns are often sleepy in the first few weeks of life, you may need to awaken your baby to get him or her to feed. °Breastfeeding times will vary from baby to baby. However, the following rules can serve as a guide to help you make sure that your baby is properly  fed: °· Newborns (babies 4 weeks of age or younger) may breastfeed every 1-3 hours. °· Newborns should not go without breastfeeding for longer than 3 hours during the day or 5 hours during the night. °· You should breastfeed your baby a minimum of 8 times in a 24-hour period. °Breast milk pumping ° °  ° °Pumping and storing breast milk allows you to make sure that your baby is exclusively fed your breast milk, even at times when you are unable to breastfeed. This is especially important if you go back to work while you are still breastfeeding, or if you are not able to be present during feedings. Your lactation consultant can help you find a method of pumping that works best for you and give you guidelines about how long it is safe to store breast milk. °Caring for your breasts while you breastfeed °Nipples can become dry, cracked, and sore while breastfeeding. The following recommendations can help keep your breasts moisturized and healthy: °· Avoid using soap on your nipples. °· Wear a supportive bra designed especially for nursing. Avoid wearing underwire-style bras or extremely tight bras (sports bras). °· Air-dry your nipples for 3-4 minutes after each feeding. °· Use only cotton bra pads to absorb leaked breast milk. Leaking of breast milk between feedings is normal. °· Use lanolin on your nipples after breastfeeding. Lanolin helps to maintain your skin's normal moisture barrier. Pure lanolin is not harmful (not toxic) to your baby. You may also hand express a few drops of breast milk and gently massage that milk into your nipples and allow the milk to air-dry. °In the first few weeks after giving birth, some women experience breast engorgement. Engorgement can make your breasts feel heavy, warm, and tender to the touch. Engorgement peaks within 3-5 days after you give birth. The following recommendations can help to ease engorgement: °· Completely empty your breasts while breastfeeding or pumping. You may  want to start by applying warm, moist heat (in the shower or with warm, water-soaked hand towels) just before feeding or pumping. This increases circulation and helps the milk flow. If your baby does not completely empty your breasts while breastfeeding, pump any extra milk after he or she is finished. °· Apply ice packs to your breasts immediately after breastfeeding or pumping, unless this is too uncomfortable for you. To do this: °? Put ice in a plastic bag. °? Place a towel between your skin and the bag. °? Leave the ice on for 20 minutes, 2-3 times a day. °· Make sure that your baby is latched on and positioned properly while breastfeeding. °If   engorgement persists after 48 hours of following these recommendations, contact your health care provider or a lactation consultant. °Overall health care recommendations while breastfeeding °· Eat 3 healthy meals and 3 snacks every day. Well-nourished mothers who are breastfeeding need an additional 450-500 calories a day. You can meet this requirement by increasing the amount of a balanced diet that you eat. °· Drink enough water to keep your urine pale yellow or clear. °· Rest often, relax, and continue to take your prenatal vitamins to prevent fatigue, stress, and low vitamin and mineral levels in your body (nutrient deficiencies). °· Do not use any products that contain nicotine or tobacco, such as cigarettes and e-cigarettes. Your baby may be harmed by chemicals from cigarettes that pass into breast milk and exposure to secondhand smoke. If you need help quitting, ask your health care provider. °· Avoid alcohol. °· Do not use illegal drugs or marijuana. °· Talk with your health care provider before taking any medicines. These include over-the-counter and prescription medicines as well as vitamins and herbal supplements. Some medicines that may be harmful to your baby can pass through breast milk. °· It is possible to become pregnant while breastfeeding. If birth  control is desired, ask your health care provider about options that will be safe while breastfeeding your baby. °Where to find more information: °[employer]: www.llli.org °Contact a health care provider if: °· You feel like you want to stop breastfeeding or have become frustrated with breastfeeding. °· Your nipples are cracked or bleeding. °· Your breasts are red, tender, or warm. °· You have: °? Painful breasts or nipples. °? A swollen area on either breast. °? A fever or chills. °? Nausea or vomiting. °? Drainage other than breast milk from your nipples. °· Your breasts do not become full before feedings by the fifth day after you give birth. °· You feel sad and depressed. °· Your baby is: °? Too sleepy to eat well. °? Having trouble sleeping. °? More than 1 week old and wetting fewer than 6 diapers in a 24-hour period. °? Not gaining weight by 5 days of age. °· Your baby has fewer than 3 stools in a 24-hour period. °· Your baby's skin or the white parts of his or her eyes become yellow. °Get help right away if: °· Your baby is overly tired (lethargic) and does not want to wake up and feed. °· Your baby develops an unexplained fever. °Summary °· Breastfeeding offers many health benefits for infant and mothers. °· Try to breastfeed your infant when he or she shows early signs of hunger. °· Gently tickle or stroke your baby's lips with your finger or nipple to allow the baby to open his or her mouth. Bring the baby to your breast. Make sure that much of the areola is in your baby's mouth. Offer one side and burp the baby before you offer the other side. °· Talk with your health care provider or lactation consultant if you have questions or you face problems as you breastfeed. °This information is not intended to replace advice given to you by your health care provider. Make sure you discuss any questions you have with your health care provider. °Document Released: 03/31/2005 Document Revised:  05/02/2016 Document Reviewed: 05/02/2016 °[employer] Interactive Patient Education © 2019 [employer] °Home Care Instructions for Mom °Activity °· Gradually return to your regular activities. °· Let yourself rest. Nap while your baby sleeps. °· Avoid lifting anything that is heavier than 10 lb (  4.5 kg) until your health care provider says it is okay. °· Avoid activities that take a lot of effort and energy (are strenuous) until approved by your health care provider. Walking at a slow-to-moderate pace is usually safe. °· If you had a cesarean delivery: °? Do not vacuum, climb stairs, or drive a car for 4-6 weeks. °? Have someone help you at home until you feel like you can do your usual activities yourself. °? Do exercises as told by your health care provider, if this applies. °Vaginal bleeding °You may continue to bleed for 4-6 weeks after delivery. Over time, the amount of blood usually decreases and the color of the blood usually gets lighter. However, the flow of bright red blood may increase if you have been too active. If you need to use more than one pad in an hour because your pad gets soaked, or if you pass a large clot: °· Lie down. °· Raise your feet. °· Place a cold compress on your lower abdomen. °· Rest. °· Call your health care provider. °If you are breastfeeding, your period should return anytime between 8 weeks after delivery and the time that you stop breastfeeding. If you are not breastfeeding, your period should return 6-8 weeks after delivery. °Perineal care °The perineal area, or perineum, is the part of your body between your thighs. After delivery, this area needs special care. Follow these instructions as told by your health care provider. °· Take warm tub baths for 15-20 minutes. °· Use medicated pads and pain-relieving sprays and creams as told. °· Do not use tampons or douches until vaginal bleeding has stopped. °· Each time you go to the bathroom: °? Use a peri bottle. °? Change your  pad. °? Use towelettes in place of toilet paper until your stitches have healed. °· Do Kegel exercises every day. Kegel exercises help to maintain the muscles that support the vagina, bladder, and bowels. You can do these exercises while you are standing, sitting, or lying down. To do Kegel exercises: °? Tighten the muscles of your abdomen and the muscles that surround your birth canal. °? Hold for a few seconds. °? Relax. °? Repeat until you have done this 5 times in a row. °· To prevent hemorrhoids from developing or getting worse: °? Drink enough fluid to keep your urine clear or pale yellow. °? Avoid straining when having a bowel movement. °? Take over-the-counter medicines and stool softeners as told by your health care provider. °Breast care °· Wear a tight-fitting bra. °· Avoid taking over-the-counter pain medicine for breast discomfort. °· Apply ice to the breasts to help with discomfort as needed: °? Put ice in a plastic bag. °? Place a towel between your skin and the bag. °? Leave the ice on for 20 minutes or as told by your health care provider. °Nutrition °· Eat a well-balanced diet. °· Do not try to lose weight quickly by cutting back on calories. °· Take your prenatal vitamins until your postpartum checkup or until your health care provider tells you to stop. °Postpartum depression °You may find yourself crying for no apparent reason and unable to cope with all of the changes that come with having a newborn. This mood is called postpartum depression. Postpartum depression happens because your hormone levels change after delivery. If you have postpartum depression, get support from your partner, friends, and family. If the depression does not go away on its own after several weeks, contact your health care provider. °Breast self-exam ° °  Do a breast self-exam each month, at the same time of the month. If you are breastfeeding, check your breasts just after a feeding, when your breasts are less full. If  you are breastfeeding and your period has started, check your breasts on day 5, 6, or 7 of your period. °Report any lumps, bumps, or discharge to your health care provider. Know that breasts are normally lumpy if you are breastfeeding. This is temporary, and it is not a health risk. °Intimacy and sexuality °Avoid sexual activity for at least 3-4 weeks after delivery or until the brownish-red vaginal flow is completely gone. If you want to avoid pregnancy, use some form of birth control. You can get pregnant after delivery, even if you have not had your period. °Contact a health care provider if: °· You feel unable to cope with the changes that a child brings to your life, and these feelings do not go away after several weeks. °· You notice a lump, a bump, or discharge on your breast. °Get help right away if: °· Blood soaks your pad in 1 hour or less. °· You have: °? Severe pain or cramping in your lower abdomen. °? A bad-smelling vaginal discharge. °? A fever that is not controlled by medicine. °? A fever, and an area of your breast is red and sore. °? Pain or redness in your calf. °? Sudden, severe chest pain. °? Shortness of breath. °? Painful or bloody urination. °? Problems with your vision. °? You vomit for 12 hours or longer. °? You develop a severe headache. °? You have serious thoughts about hurting yourself, your child, or anyone else. °This information is not intended to replace advice given to you by your health care provider. Make sure you discuss any questions you have with your health care provider. °Document Released: 03/28/2000 Document Revised: 05/27/2017 Document Reviewed: 10/02/2014 °[employer] Interactive Patient Education © 2019 [employer] °Postpartum Care After Vaginal Delivery °This sheet gives you information about how to care for yourself from the time you deliver your baby to up to 6-12 weeks after delivery (postpartum period). Your health care provider may also give you more specific  instructions. If you have problems or questions, contact your health care provider. °Follow these instructions at home: °Vaginal bleeding °· It is normal to have vaginal bleeding (lochia) after delivery. Wear a sanitary pad for vaginal bleeding and discharge. °? During the first week after delivery, the amount and appearance of lochia is often similar to a menstrual period. °? Over the next few weeks, it will gradually decrease to a dry, yellow-brown discharge. °? For most women, lochia stops completely by 4-6 weeks after delivery. Vaginal bleeding can vary from woman to woman. °· Change your sanitary pads frequently. Watch for any changes in your flow, such as: °? A sudden increase in volume. °? A change in color. °? Large blood clots. °· If you pass a blood clot from your vagina, save it and call your health care provider to discuss. Do not flush blood clots down the toilet before talking with your health care provider. °· Do not use tampons or douches until your health care provider says this is safe. °· If you are not breastfeeding, your period should return 6-8 weeks after delivery. If you are feeding your child breast milk only (exclusive breastfeeding), your period may not return until you stop breastfeeding. °Perineal care °· Keep the area between the vagina and the anus (perineum) clean and dry as told by your   health care provider. Use medicated pads and pain-relieving sprays and creams as directed. °· If you had a cut in the perineum (episiotomy) or a tear in the vagina, check the area for signs of infection until you are healed. Check for: °? More redness, swelling, or pain. °? Fluid or blood coming from the cut or tear. °? Warmth. °? Pus or a bad smell. °· You may be given a squirt bottle to use instead of wiping to clean the perineum area after you go to the bathroom. As you start healing, you may use the squirt bottle before wiping yourself. Make sure to wipe gently. °· To relieve pain caused by an  episiotomy, a tear in the vagina, or swollen veins in the anus (hemorrhoids), try taking a warm sitz bath 2-3 times a day. A sitz bath is a warm water bath that is taken while you are sitting down. The water should only come up to your hips and should cover your buttocks. °Breast care °· Within the first few days after delivery, your breasts may feel heavy, full, and uncomfortable (breast engorgement). Milk may also leak from your breasts. Your health care provider can suggest ways to help relieve the discomfort. Breast engorgement should go away within a few days. °· If you are breastfeeding: °? Wear a bra that supports your breasts and fits you well. °? Keep your nipples clean and dry. Apply creams and ointments as told by your health care provider. °? You may need to use breast pads to absorb milk that leaks from your breasts. °? You may have uterine contractions every time you breastfeed for up to several weeks after delivery. Uterine contractions help your uterus return to its normal size. °? If you have any problems with breastfeeding, work with your health care provider or lactation consultant. °· If you are not breastfeeding: °? Avoid touching your breasts a lot. Doing this can make your breasts produce more milk. °? Wear a good-fitting bra and use cold packs to help with swelling. °? Do not squeeze out (express) milk. This causes you to make more milk. °Intimacy and sexuality °· Ask your health care provider when you can engage in sexual activity. This may depend on: °? Your risk of infection. °? How fast you are healing. °? Your comfort and desire to engage in sexual activity. °· You are able to get pregnant after delivery, even if you have not had your period. If desired, talk with your health care provider about methods of birth control (contraception). °Medicines °· Take over-the-counter and prescription medicines only as told by your health care provider. °· If you were prescribed an antibiotic  medicine, take it as told by your health care provider. Do not stop taking the antibiotic even if you start to feel better. °Activity °· Gradually return to your normal activities as told by your health care provider. Ask your health care provider what activities are safe for you. °· Rest as much as possible. Try to rest or take a nap while your baby is sleeping. °Eating and drinking ° °· Drink enough fluid to keep your urine pale yellow. °· Eat high-fiber foods every day. These may help prevent or relieve constipation. High-fiber foods include: °? Whole grain cereals and breads. °? Brown rice. °? Beans. °? Fresh fruits and vegetables. °· Do not try to lose weight quickly by cutting back on calories. °· Take your prenatal vitamins until your postpartum checkup or until your health care provider tells you it   is okay to stop. °Lifestyle °· Do not use any products that contain nicotine or tobacco, such as cigarettes and e-cigarettes. If you need help quitting, ask your health care provider. °· Do not drink alcohol, especially if you are breastfeeding. °General instructions °· Keep all follow-up visits for you and your baby as told by your health care provider. Most women visit their health care provider for a postpartum checkup within the first 3-6 weeks after delivery. °Contact a health care provider if: °· You feel unable to cope with the changes that your child brings to your life, and these feelings do not go away. °· You feel unusually sad or worried. °· Your breasts become red, painful, or hard. °· You have a fever. °· You have trouble holding urine or keeping urine from leaking. °· You have little or no interest in activities you used to enjoy. °· You have not breastfed at all and you have not had a menstrual period for 12 weeks after delivery. °· You have stopped breastfeeding and you have not had a menstrual period for 12 weeks after you stopped breastfeeding. °· You have questions about caring for yourself or  your baby. °· You pass a blood clot from your vagina. °Get help right away if: °· You have chest pain. °· You have difficulty breathing. °· You have sudden, severe leg pain. °· You have severe pain or cramping in your lower abdomen. °· You bleed from your vagina so much that you fill more than one sanitary pad in one hour. Bleeding should not be heavier than your heaviest period. °· You develop a severe headache. °· You faint. °· You have blurred vision or spots in your vision. °· You have bad-smelling vaginal discharge. °· You have thoughts about hurting yourself or your baby. °If you ever feel like you may hurt yourself or others, or have thoughts about taking your own life, get help right away. You can go to the nearest emergency department or call: °· Your local emergency services (911 in the [place] °· A suicide crisis helpline, such as the National Suicide Prevention Lifeline at 1-800-273-8255. This is open 24 hours a day. °Summary °· The period of time right after you deliver your newborn up to 6-12 weeks after delivery is called the postpartum period. °· Gradually return to your normal activities as told by your health care provider. °· Keep all follow-up visits for you and your baby as told by your health care provider. °This information is not intended to replace advice given to you by your health care provider. Make sure you discuss any questions you have with your health care provider. °Document Released: 01/26/2007 Document Revised: 01/12/2017 Document Reviewed: 01/12/2017 °Elsevier Interactive Patient Education © 2019 [employer] °Postpartum Baby Blues °The postpartum period begins right after the birth of a baby. During this time, there is often a lot of joy and excitement. It is also a time of many changes in the life of the parents. No matter how many times a mother gives birth, each child brings new challenges to the family, including different ways of relating to one another. °It is common to  have feelings of excitement along with confusing changes in moods, emotions, and thoughts. You may feel happy one minute and sad or stressed the next. These feelings of sadness usually happen in the period right after you have your baby, and they go away within a week or two. This is called the "baby blues." °What are the causes? °  There is no known cause of baby blues. It is likely caused by a combination of factors. However, changes in hormone levels after childbirth are believed to trigger some of the symptoms. °Other factors that can play a role in these mood changes include: °· Lack of sleep. °· Stressful life events, such as poverty, caring for a loved one, or death of a loved one. °· Genetics. °What are the signs or symptoms? °Symptoms of this condition include: °· Brief changes in mood, such as going from extreme happiness to sadness. °· Decreased concentration. °· Difficulty sleeping. °· Crying spells and tearfulness. °· Loss of appetite. °· Irritability. °· Anxiety. °If the symptoms of baby blues last for more than 2 weeks or become more severe, you may have postpartum depression. °How is this diagnosed? °This condition is diagnosed based on an evaluation of your symptoms. There are no medical or lab tests that lead to a diagnosis, but there are various questionnaires that a health care provider may use to identify women with the baby blues or postpartum depression. °How is this treated? °Treatment is not needed for this condition. The baby blues usually go away on their own in 1-2 weeks. Social support is often all that is needed. You will be encouraged to get adequate sleep and rest. °Follow these instructions at home: °Lifestyle ° °  ° °· Get as much rest as you can. Take a nap when the baby sleeps. °· Exercise regularly as told by your health care provider. Some women find yoga and walking to be helpful. °· Eat a balanced and nourishing diet. This includes plenty of fruits and vegetables, whole grains,  and lean proteins. °· Do little things that you enjoy. Have a cup of tea, take a bubble bath, read your favorite magazine, or listen to your favorite music. °· Avoid alcohol. °· Ask for help with household chores, cooking, grocery shopping, or running errands. Do not try to do everything yourself. Consider hiring a postpartum doula to help. This is a professional who specializes in providing support to new mothers. °· Try not to make any major life changes during pregnancy or right after giving birth. This can add stress. °General instructions °· Talk to people close to you about how you are feeling. Get support from your partner, family members, friends, or other new moms. You may want to join a support group. °· Find ways to cope with stress. This may include: °? Writing your thoughts and feelings in a journal. °? Spending time outside. °? Spending time with people who make you laugh. °· Try to stay positive in how you think. Think about the things you are grateful for. °· Take over-the-counter and prescription medicines only as told by your health care provider. °· Let your health care provider know if you have any concerns. °· Keep all postpartum visits as told by your health care provider. This is important. °Contact a health care provider if: °· Your baby blues do not go away after 2 weeks. °Get help right away if: °· You have thoughts of taking your own life (suicidal thoughts). °· You think you may harm the baby or other people. °· You see or hear things that are not there (hallucinations). °Summary °· After giving birth, you may feel happy one minute and sad or stressed the next. Feelings of sadness that happen right after the baby is born and go away after a week or two are called the "baby blues." °· You can manage the baby   blues by getting enough rest, eating a healthy diet, exercising, spending time with supportive people, and finding ways to cope with stress. °· If feelings of sadness and stress last  longer than 2 weeks or get in the way of caring for your baby, talk to your health care provider. This may mean you have postpartum depression. °This information is not intended to replace advice given to you by your health care provider. Make sure you discuss any questions you have with your health care provider. °Document Released: 01/03/2004 Document Revised: 05/27/2016 Document Reviewed: 05/27/2016 °Elsevier Interactive Patient Education © 2019 [employer]. ° °

## 2018-07-10 NOTE — Discharge Summary (Signed)
Obstetric Discharge Summary  Patient ID: Christina Warner MRN: [id number] DOB/AGE: 18-May-1995 23 y.o.   Date of Admission: 07/09/2018   Date of Discharge:  07/10/18  Admitting Diagnosis: Induction of labor at [redacted]w[redacted]d  Secondary Diagnosis: Obesity in pregnancy, ADHD, Asthma-occasional inhaler use, History of forceps assisted birth, Rh positive, Smoker  Mode of Delivery: Normal spontaneous vaginal birth     Discharge Diagnosis: No other diagnosis   Intrapartum Procedures: Atificial rupture of membranes, epidural, GBS prophylaxis and pitocin augmentation   Post partum procedures: None  Complications: First degree perineal laceration, repaired   Brief Hospital Course   Christina Warner is a [id number] who had a SVD on 07/09/2018;  for further details of this birth, please refer to the delivey note.  Patient had an uncomplicated postpartum course.  By time of discharge on PPD#1, her pain was controlled on oral pain medications; she had appropriate lochia and was ambulating, voiding without difficulty and tolerating regular diet.  She was deemed stable for discharge to home.    Labs:  CBC Latest Ref Rng & Units 07/10/2018 07/09/2018 04/13/2018  WBC 4.0 - 10.5 K/uL 10.7(H) 10.5 10.8  Hemoglobin 12.0 - 15.0 g/dL 10.4(L) 11.8(L) 10.9(L)  Hematocrit 36.0 - 46.0 % 32.0(L) 40.5 32.3(L)  Platelets 150 - 400 K/uL 172 145(L) 232   O POS  Physical exam:   Temp:  [97.7 F (36.5 C)-98.5 F (36.9 C)] 98.2 F (36.8 C) (03/28 0802) Pulse Rate:  [82-125] 83 (03/28 0802) Resp:  [14-20] 18 (03/28 0802) BP: (95-134)/(41-96) 111/61 (03/28 0802) SpO2:  [96 %-100 %] 98 % (03/28 0802)  General: alert and no distress  Lochia: appropriate  Abdomen: soft, NT  Uterine Fundus: firm  Laceration: healing well, no significant drainage, no dehiscence, no significant erythema  Extremities: No evidence of DVT seen on physical exam. No lower extremity edema.  Discharge Instructions: Per After Visit  Summary.  Activity: Advance as tolerated. Pelvic rest for 6 weeks.  Also refer to After Visit  Summary  Diet: Regular  Medications: Allergies as of 07/10/2018   No Active Allergies     Medication List    STOP taking these medications   Diclegis 10-10 MG Tbec Generic drug:  Doxylamine-Pyridoxine     TAKE these medications   albuterol 108 (90 Base) MCG/ACT inhaler Commonly known as:  PROVENTIL HFA;VENTOLIN HFA Inhale 2 puffs into the lungs every 6 (six) hours as needed for wheezing or shortness of breath.   ferrous sulfate 325 (65 FE) MG tablet Commonly known as:  Iron Supplement Take 1 tablet (325 mg total) by mouth daily with breakfast.   ibuprofen 600 MG tablet Commonly known as:  ADVIL,MOTRIN Take 1 tablet (600 mg total) by mouth every 6 (six) hours.   prenatal multivitamin Tabs tablet Take 1 tablet by mouth daily at 12 noon.   simethicone 80 MG chewable tablet Commonly known as:  MYLICON Chew 1 tablet (80 mg total) by mouth as needed for flatulence.      Outpatient follow up:  Follow-up Information    Gunnar Bulla, CNM. Call in 4 day(s).   Specialties:  Certified Nurse Midwife, Obstetrics and Gynecology, Radiology Why:  Please call to schedule four (4) to six (6) week postpartum visit with [facility] Contact information: [address] [place] [address] [phone]          Postpartum contraception: IUD, Mirena  Discharged Condition: stable  Discharged to: home   Newborn Data:  Disposition:home with  mother  Apgars: APGAR (1 MIN): 8   APGAR (5 MINS): 9    Baby Feeding: Bottle and Breast    Gunnar Bulla, CNM [facility], [facility] 07/10/18 7:36 AM

## 2018-07-14 NOTE — Anesthesia Postprocedure Evaluation (Signed)
Anesthesia Post Note  Patient: Christina Warner Section  Procedure(s) Performed: AN AD HOC LABOR EPIDURAL  Patient location during evaluation: Mother Baby Anesthesia Type: Epidural Level of consciousness: awake and alert Pain management: pain level controlled Vital Signs Assessment: post-procedure vital signs reviewed and stable Respiratory status: spontaneous breathing, nonlabored ventilation and respiratory function stable Cardiovascular status: stable Postop Assessment: no headache and no backache Anesthetic complications: no     Last Vitals:  Vitals:   07/10/18 0405 07/10/18 0802  BP: 123/81 111/61  Pulse: 88 83  Resp: 18 18  Temp: 36.9 C 36.8 C  SpO2: 99% 98%    Last Pain:  Vitals:   07/10/18 1200  TempSrc:   PainSc: 4                  Lenard Simmer

## 2018-08-19 ENCOUNTER — Telehealth: Payer: Self-pay

## 2018-08-19 NOTE — Telephone Encounter (Signed)
Coronavirus (COVID-19) Are you at risk?  Are you at risk for the Coronavirus (COVID-19)?  To be considered HIGH RISK for Coronavirus (COVID-19), you have to meet the following criteria:  . Traveled to [place], [place], [place], [place] or [place]; or in the [place] to [place], [place], [place], or [place]; and have fever, cough, and shortness of breath within the last 2 weeks of travel OR . Been in close contact with a person diagnosed with COVID-19 within the last 2 weeks and have fever, cough, and shortness of breath . IF YOU DO NOT MEET THESE CRITERIA, YOU ARE CONSIDERED LOW RISK FOR COVID-19.  What to do if you are HIGH RISK for COVID-19?  . If you are having a medical emergency, call 911. . Seek medical care right away. Before you go to a doctor's office, urgent care or emergency department, call ahead and tell them about your recent travel, contact with someone diagnosed with COVID-19, and your symptoms. You should receive instructions from your physician's office regarding next steps of care.  . When you arrive at healthcare provider, tell the healthcare staff immediately you have returned from visiting [place], [place], [place], [place] or [place]; or traveled in the [place] to [place], [place], [place], or [place]; in the last two weeks or you have been in close contact with a person diagnosed with COVID-19 in the last 2 weeks.   . Tell the health care staff about your symptoms: fever, cough and shortness of breath. . After you have been seen by a medical provider, you will be either: o Tested for (COVID-19) and discharged home on quarantine except to seek medical care if symptoms worsen, and asked to  - Stay home and avoid contact with others until you get your results (4-5 days)  - Avoid travel on public transportation if possible (such as bus, train, or airplane) or o Sent to the Emergency Department by EMS for evaluation, COVID-19 testing, and possible  admission depending on your condition and test results.  What to do if you are LOW RISK for COVID-19?  Reduce your risk of any infection by using the same precautions used for avoiding the common cold or flu:  . Wash your hands often with soap and warm water for at least 20 seconds.  If soap and water are not readily available, use an alcohol-based hand sanitizer with at least 60% alcohol.  . If coughing or sneezing, cover your mouth and nose by coughing or sneezing into the elbow areas of your shirt or coat, into a tissue or into your sleeve (not your hands). . Avoid shaking hands with others and consider head nods or verbal greetings only. . Avoid touching your eyes, nose, or mouth with unwashed hands.  . Avoid close contact with people who are sick. . Avoid places or events with large numbers of people in one location, like concerts or sporting events. . Carefully consider travel plans you have or are making. . If you are planning any travel outside or inside the [place], visit the CDC's Travelers' Health webpage for the latest health notices. . If you have some symptoms but not all symptoms, continue to monitor at home and seek medical attention if your symptoms worsen. . If you are having a medical emergency, call 911.   ADDITIONAL HEALTHCARE OPTIONS FOR PATIENTS  [facility] / e-Visit: [email or web address]         [facility]: [phone]  [facility] [phone]                   [facility]: [phone]   Pre-screen negative, DM.   

## 2018-08-20 ENCOUNTER — Other Ambulatory Visit: Payer: Self-pay

## 2018-08-20 ENCOUNTER — Encounter: Payer: Self-pay | Admitting: Certified Nurse Midwife

## 2018-08-20 ENCOUNTER — Ambulatory Visit (INDEPENDENT_AMBULATORY_CARE_PROVIDER_SITE_OTHER): Payer: Medicaid Other | Admitting: Certified Nurse Midwife

## 2018-08-20 DIAGNOSIS — Z30430 Encounter for insertion of intrauterine contraceptive device: Secondary | ICD-10-CM

## 2018-08-20 DIAGNOSIS — Z1389 Encounter for screening for other disorder: Secondary | ICD-10-CM | POA: Diagnosis not present

## 2018-08-20 NOTE — Patient Instructions (Signed)
Levonorgestrel intrauterine device (IUD) What is this medicine? LEVONORGESTREL IUD (LEE voe nor jes trel) is a contraceptive (birth control) device. The device is placed inside the uterus by a healthcare professional. It is used to prevent pregnancy. This device can also be used to treat heavy bleeding that occurs during your period. This medicine may be used for other purposes; ask your health care provider or pharmacist if you have questions. COMMON BRAND NAME(S): Minette Headland What should I tell my health care provider before I take this medicine? They need to know if you have any of these conditions: -abnormal Pap smear -cancer of the breast, uterus, or cervix -diabetes -endometritis -genital or pelvic infection now or in the past -have more than one sexual partner or your partner has more than one partner -heart disease -history of an ectopic or tubal pregnancy -immune system problems -IUD in place -liver disease or tumor -problems with blood clots or take blood-thinners -seizures -use intravenous drugs -uterus of unusual shape -vaginal bleeding that has not been explained -an unusual or allergic reaction to levonorgestrel, other hormones, silicone, or polyethylene, medicines, foods, dyes, or preservatives -pregnant or trying to get pregnant -breast-feeding How should I use this medicine? This device is placed inside the uterus by a health care professional. Talk to your pediatrician regarding the use of this medicine in children. Special care may be needed. Overdosage: If you think you have taken too much of this medicine contact a poison control center or emergency room at once. NOTE: This medicine is only for you. Do not share this medicine with others. What if I miss a dose? This does not apply. Depending on the brand of device you have inserted, the device will need to be replaced every 3 to 5 years if you wish to continue using this type of birth  control. What may interact with this medicine? Do not take this medicine with any of the following medications: -amprenavir -bosentan -fosamprenavir This medicine may also interact with the following medications: -aprepitant -armodafinil -barbiturate medicines for inducing sleep or treating seizures -bexarotene -boceprevir -griseofulvin -medicines to treat seizures like carbamazepine, ethotoin, felbamate, oxcarbazepine, phenytoin, topiramate -modafinil -pioglitazone -rifabutin -rifampin -rifapentine -some medicines to treat HIV infection like atazanavir, efavirenz, indinavir, lopinavir, nelfinavir, tipranavir, ritonavir -St. John's wort -warfarin This list may not describe all possible interactions. Give your health care provider a list of all the medicines, herbs, non-prescription drugs, or dietary supplements you use. Also tell them if you smoke, drink alcohol, or use illegal drugs. Some items may interact with your medicine. What should I watch for while using this medicine? Visit your doctor or health care professional for regular check ups. See your doctor if you or your partner has sexual contact with others, becomes HIV positive, or gets a sexual transmitted disease. This product does not protect you against HIV infection (AIDS) or other sexually transmitted diseases. You can check the placement of the IUD yourself by reaching up to the top of your vagina with clean fingers to feel the threads. Do not pull on the threads. It is a good habit to check placement after each menstrual period. Call your doctor right away if you feel more of the IUD than just the threads or if you cannot feel the threads at all. The IUD may come out by itself. You may become pregnant if the device comes out. If you notice that the IUD has come out use a backup birth control method like condoms and call your  health care provider. Using tampons will not change the position of the IUD and are okay to use  during your period. This IUD can be safely scanned with magnetic resonance imaging (MRI) only under specific conditions. Before you have an MRI, tell your healthcare provider that you have an IUD in place, and which type of IUD you have in place. What side effects may I notice from receiving this medicine? Side effects that you should report to your doctor or health care professional as soon as possible: -allergic reactions like skin rash, itching or hives, swelling of the face, lips, or tongue -fever, flu-like symptoms -genital sores -high blood pressure -no menstrual period for 6 weeks during use -pain, swelling, warmth in the leg -pelvic pain or tenderness -severe or sudden headache -signs of pregnancy -stomach cramping -sudden shortness of breath -trouble with balance, talking, or walking -unusual vaginal bleeding, discharge -yellowing of the eyes or skin Side effects that usually do not require medical attention (report to your doctor or health care professional if they continue or are bothersome): -acne -breast pain -change in sex drive or performance -changes in weight -cramping, dizziness, or faintness while the device is being inserted -headache -irregular menstrual bleeding within first 3 to 6 months of use -nausea This list may not describe all possible side effects. Call your doctor for medical advice about side effects. You may report side effects to FDA at 1-800-FDA-1088. Where should I keep my medicine? This does not apply. NOTE: This sheet is a summary. It may not cover all possible information. If you have questions about this medicine, talk to your doctor, pharmacist, or health care provider.  2019 Elsevier/Gold Standard (2016-01-11 14:14:56) Preventive Care 18-39 Years, Female Preventive care refers to lifestyle choices and visits with your health care provider that can promote health and wellness. What does preventive care include?   A yearly physical exam.  This is also called an annual well check.  Dental exams once or twice a year.  Routine eye exams. Ask your health care provider how often you should have your eyes checked.  Personal lifestyle choices, including: ? Daily care of your teeth and gums. ? Regular physical activity. ? Eating a healthy diet. ? Avoiding tobacco and drug use. ? Limiting alcohol use. ? Practicing safe sex. ? Taking vitamin and mineral supplements as recommended by your health care provider. What happens during an annual well check? The services and screenings done by your health care provider during your annual well check will depend on your age, overall health, lifestyle risk factors, and family history of disease. Counseling Your health care provider may ask you questions about your:  Alcohol use.  Tobacco use.  Drug use.  Emotional well-being.  Home and relationship well-being.  Sexual activity.  Eating habits.  Work and work Statistician.  Method of birth control.  Menstrual cycle.  Pregnancy history. Screening You may have the following tests or measurements:  Height, weight, and BMI.  Diabetes screening. This is done by checking your blood sugar (glucose) after you have not eaten for a while (fasting).  Blood pressure.  Lipid and cholesterol levels. These may be checked every 5 years starting at age 37.  Skin check.  Hepatitis C blood test.  Hepatitis B blood test.  Sexually transmitted disease (STD) testing.  BRCA-related cancer screening. This may be done if you have a family history of breast, ovarian, tubal, or peritoneal cancers.  Pelvic exam and Pap test. This may be done every 3  years starting at age 53. Starting at age 29, this may be done every 5 years if you have a Pap test in combination with an HPV test. Discuss your test results, treatment options, and if necessary, the need for more tests with your health care provider. Vaccines Your health care provider may  recommend certain vaccines, such as:  Influenza vaccine. This is recommended every year.  Tetanus, diphtheria, and acellular pertussis (Tdap, Td) vaccine. You may need a Td booster every 10 years.  Varicella vaccine. You may need this if you have not been vaccinated.  HPV vaccine. If you are 41 or younger, you may need three doses over 6 months.  Measles, mumps, and rubella (MMR) vaccine. You may need at least one dose of MMR. You may also need a second dose.  Pneumococcal 13-valent conjugate (PCV13) vaccine. You may need this if you have certain conditions and were not previously vaccinated.  Pneumococcal polysaccharide (PPSV23) vaccine. You may need one or two doses if you smoke cigarettes or if you have certain conditions.  Meningococcal vaccine. One dose is recommended if you are age 43-21 years and a first-year college student living in a residence hall, or if you have one of several medical conditions. You may also need additional booster doses.  Hepatitis A vaccine. You may need this if you have certain conditions or if you travel or work in places where you may be exposed to hepatitis A.  Hepatitis B vaccine. You may need this if you have certain conditions or if you travel or work in places where you may be exposed to hepatitis B.  Haemophilus influenzae type b (Hib) vaccine. You may need this if you have certain risk factors. Talk to your health care provider about which screenings and vaccines you need and how often you need them. This information is not intended to replace advice given to you by your health care provider. Make sure you discuss any questions you have with your health care provider. Document Released: 05/27/2001 Document Revised: 11/11/2016 Document Reviewed: 01/30/2015 Elsevier Interactive Patient Education  2019 [employer].

## 2018-08-20 NOTE — Progress Notes (Signed)
Subjective:    Christina Warner is a 23 y.o. G28P2002 African American female who presents for a postpartum visit. She is 6 weeks postpartum following a spontaneous vaginal delivery at 39+3 gestational weeks. Anesthesia: epidural. I have fully reviewed the prenatal and intrapartum course.   Postpartum course has been uncomplicated. Baby's course has been uncomplicated. Baby is feeding by formula. Bleeding menses resumed Sunday, 08/15/2018. Bowel function is normal. Bladder function is normal.   Patient is sexually active. Last sexual activity: one (1) week ago. Contraception method is none; desires Mirena IUD.   Postpartum depression screening: negative. Score 1.  Last pap due.   The following portions of the patient's history were reviewed and updated as appropriate: allergies, current medications, past medical history, past surgical history and problem list.  Review of Systems  Pertinent items are noted in HPI.   Objective:   BP 118/89   Pulse 85   Ht 5' (1.524 m)   Wt 194 lb 8 oz (88.2 kg)   LMP 08/15/2018 (Exact Date)   Breastfeeding No   BMI 37.99 kg/m   General:  alert, cooperative and no distress   Breasts:  deferred, no complaints  Lungs: clear to auscultation bilaterally  Heart:  regular rate and rhythm  Abdomen: soft, nontender   Vulva: normal  Vagina: normal vagina  Cervix:  closed  Corpus: Well-involuted  Adnexa:  Non-palpable   Depression screen [facility] 2/9 08/20/2018  Decreased Interest 0  Down, Depressed, Hopeless 1  PHQ - 2 Score 1  Altered sleeping 0  Tired, decreased energy 0  Change in appetite 0  Feeling bad or failure about yourself  0  Trouble concentrating 0  Moving slowly or fidgety/restless 0  Suicidal thoughts 0  PHQ-9 Score 1  Some encounter information is confidential and restricted. Go to Review Flowsheets activity to see all data.         Assessment:   Postpartum exam Six (6) wks s/p induced vaginal birth Formula feeding Depression  screening Contraception counseling   Plan:   May return to work without restriction.   Encouraged routine health maintenance techniques.   Desires Mirena IUD as contraception, see note below.   Reviewed red flag symptoms and when to call.   Follow up in: 8 weeks for ANNUAL EXAM with PAP or earlier if needed.   Gunnar Bulla, CNM [facility], [facility] 08/20/18 12:46 PM   Mirena IUD Insertion Note:  Christina Warner is a 23 y.o. year old G72P2002 African American female who presents for placement of a Mirena IUD.  BP 118/89   Pulse 85   Ht 5' (1.524 m)   Wt 194 lb 8 oz (88.2 kg)   LMP 08/15/2018 (Exact Date)   Breastfeeding No   BMI 37.99 kg/m    Last sexual intercourse was last week.   The risks and benefits of the method and placement have been thouroughly reviewed with the patient and all questions were answered.  Specifically the patient is aware of failure rate of 04/998, expulsion of the IUD and of possible perforation.  The patient is aware of irregular bleeding due to the method and understands the incidence of irregular bleeding diminishes with time.  Signed copy of informed consent in chart.   Time out was performed.  A medium speculum was placed in the vagina.  The cervix was visualized, prepped using Betadine, and grasped with a single tooth tenaculum. The uterus was sounded to 8 cm.  Mirena IUD placed  per manufacturer's recommendations.   The strings were trimmed to 3 cm.  The patient was given post procedure instructions, including signs and symptoms of infection and to check for the strings after each menses or each month, and refraining from intercourse or anything in the vagina for 3 days.  She was given a Mirena care card with date Mirena placed, and date Mirena to be removed.  Reviewed red flag symptoms and when to call.   RTC x 6-8 weeks for IUD string check or sooner if needed.    Gunnar BullaJenkins Michelle Bakary Bramer, CNM [facility], [facility] 08/20/18 12:47 PM   NDC: [id number] Lot: [id number] Exp: 08/2020

## 2018-10-04 ENCOUNTER — Telehealth: Payer: Self-pay

## 2018-10-04 NOTE — Telephone Encounter (Signed)
Coronavirus (COVID-19) Are you at risk?  Are you at risk for the Coronavirus (COVID-19)?  To be considered HIGH RISK for Coronavirus (COVID-19), you have to meet the following criteria:  . Traveled to [place], [place], [place], [place] or [place]; or in the [place] to [place], [place], [place], or [place]; and have fever, cough, and shortness of breath within the last 2 weeks of travel OR . Been in close contact with a person diagnosed with COVID-19 within the last 2 weeks and have fever, cough, and shortness of breath . IF YOU DO NOT MEET THESE CRITERIA, YOU ARE CONSIDERED LOW RISK FOR COVID-19.  What to do if you are HIGH RISK for COVID-19?  . If you are having a medical emergency, call 911. . Seek medical care right away. Before you go to a doctor's office, urgent care or emergency department, call ahead and tell them about your recent travel, contact with someone diagnosed with COVID-19, and your symptoms. You should receive instructions from your physician's office regarding next steps of care.  . When you arrive at healthcare provider, tell the healthcare staff immediately you have returned from visiting [place], [place], [place], [place] or [place]; or traveled in the [place] to [place], [place], [place], or [place]; in the last two weeks or you have been in close contact with a person diagnosed with COVID-19 in the last 2 weeks.   . Tell the health care staff about your symptoms: fever, cough and shortness of breath. . After you have been seen by a medical provider, you will be either: o Tested for (COVID-19) and discharged home on quarantine except to seek medical care if symptoms worsen, and asked to  - Stay home and avoid contact with others until you get your results (4-5 days)  - Avoid travel on public transportation if possible (such as bus, train, or airplane) or o Sent to the Emergency Department by EMS for evaluation, COVID-19 testing, and possible  admission depending on your condition and test results.  What to do if you are LOW RISK for COVID-19?  Reduce your risk of any infection by using the same precautions used for avoiding the common cold or flu:  . Wash your hands often with soap and warm water for at least 20 seconds.  If soap and water are not readily available, use an alcohol-based hand sanitizer with at least 60% alcohol.  . If coughing or sneezing, cover your mouth and nose by coughing or sneezing into the elbow areas of your shirt or coat, into a tissue or into your sleeve (not your hands). . Avoid shaking hands with others and consider head nods or verbal greetings only. . Avoid touching your eyes, nose, or mouth with unwashed hands.  . Avoid close contact with people who are sick. . Avoid places or events with large numbers of people in one location, like concerts or sporting events. . Carefully consider travel plans you have or are making. . If you are planning any travel outside or inside the [place], visit the CDC's Travelers' Health webpage for the latest health notices. . If you have some symptoms but not all symptoms, continue to monitor at home and seek medical attention if your symptoms worsen. . If you are having a medical emergency, call 911.   ADDITIONAL HEALTHCARE OPTIONS FOR PATIENTS  [facility] / e-Visit: [email or web address]         [facility]: [phone]  [place]   [facility]: [phone]                   [facility]: [phone]   Pre-screen negative, DM.   

## 2018-10-05 ENCOUNTER — Other Ambulatory Visit (HOSPITAL_COMMUNITY)
Admission: RE | Admit: 2018-10-05 | Discharge: 2018-10-05 | Disposition: A | Discharge status: Home / Self Care | Payer: Medicaid Other | Source: Ambulatory Visit | Attending: Certified Nurse Midwife | Admitting: Certified Nurse Midwife

## 2018-10-05 ENCOUNTER — Encounter: Payer: Self-pay | Admitting: Certified Nurse Midwife

## 2018-10-05 ENCOUNTER — Ambulatory Visit (INDEPENDENT_AMBULATORY_CARE_PROVIDER_SITE_OTHER): Payer: Medicaid Other | Admitting: Certified Nurse Midwife

## 2018-10-05 ENCOUNTER — Other Ambulatory Visit: Payer: Self-pay

## 2018-10-05 VITALS — BP 116/82 | HR 98 | Ht 60.0 in | Wt 196.5 lb

## 2018-10-05 DIAGNOSIS — Z124 Encounter for screening for malignant neoplasm of cervix: Secondary | ICD-10-CM | POA: Diagnosis present

## 2018-10-05 DIAGNOSIS — Z01419 Encounter for gynecological examination (general) (routine) without abnormal findings: Secondary | ICD-10-CM

## 2018-10-05 DIAGNOSIS — Z975 Presence of (intrauterine) contraceptive device: Secondary | ICD-10-CM | POA: Insufficient documentation

## 2018-10-05 DIAGNOSIS — F419 Anxiety disorder, unspecified: Secondary | ICD-10-CM

## 2018-10-05 DIAGNOSIS — F329 Major depressive disorder, single episode, unspecified: Secondary | ICD-10-CM

## 2018-10-05 DIAGNOSIS — F32A Depression, unspecified: Secondary | ICD-10-CM

## 2018-10-05 DIAGNOSIS — Z0000 Encounter for general adult medical examination without abnormal findings: Secondary | ICD-10-CM

## 2018-10-05 MED ORDER — ESCITALOPRAM OXALATE 10 MG PO TABS: 10.0000 mg | ORAL_TABLET | Freq: Every day | ORAL | 1 refills | Status: DC

## 2018-10-05 NOTE — Patient Instructions (Signed)
Escitalopram tablets What is this medicine? ESCITALOPRAM (es sye TAL oh pram) is used to treat depression and certain types of anxiety. This medicine may be used for other purposes; ask your health care provider or pharmacist if you have questions. COMMON BRAND NAME(S): Lexapro What should I tell my health care provider before I take this medicine? They need to know if you have any of these conditions: -bipolar disorder or a family history of bipolar disorder -diabetes -glaucoma -heart disease -kidney or liver disease -receiving electroconvulsive therapy -seizures (convulsions) -suicidal thoughts, plans, or attempt by you or a family member -an unusual or allergic reaction to escitalopram, the related drug citalopram, other medicines, foods, dyes, or preservatives -pregnant or trying to become pregnant -breast-feeding How should I use this medicine? Take this medicine by mouth with a glass of water. Follow the directions on the prescription label. You can take it with or without food. If it upsets your stomach, take it with food. Take your medicine at regular intervals. Do not take it more often than directed. Do not stop taking this medicine suddenly except upon the advice of your doctor. Stopping this medicine too quickly may cause serious side effects or your condition may worsen. A special MedGuide will be given to you by the pharmacist with each prescription and refill. Be sure to read this information carefully each time. Talk to your pediatrician regarding the use of this medicine in children. Special care may be needed. Overdosage: If you think you have taken too much of this medicine contact a poison control center or emergency room at once. NOTE: This medicine is only for you. Do not share this medicine with others. What if I miss a dose? If you miss a dose, take it as soon as you can. If it is almost time for your next dose, take only that dose. Do not take double or extra doses.  What may interact with this medicine? Do not take this medicine with any of the following medications: -certain medicines for fungal infections like fluconazole, itraconazole, ketoconazole, posaconazole, voriconazole -cisapride -citalopram -dofetilide -dronedarone -linezolid -MAOIs like Carbex, Eldepryl, Marplan, Nardil, and Parnate -methylene blue (injected into a vein) -pimozide -thioridazine -ziprasidone This medicine may also interact with the following medications: -alcohol -amphetamines -aspirin and aspirin-like medicines -carbamazepine -certain medicines for depression, anxiety, or psychotic disturbances -certain medicines for migraine headache like almotriptan, eletriptan, frovatriptan, naratriptan, rizatriptan, sumatriptan, zolmitriptan -certain medicines for sleep -certain medicines that treat or prevent blood clots like warfarin, enoxaparin, dalteparin -cimetidine -diuretics -fentanyl -furazolidone -isoniazid -lithium -metoprolol -NSAIDs, medicines for pain and inflammation, like ibuprofen or naproxen -other medicines that prolong the QT interval (cause an abnormal heart rhythm) -procarbazine -rasagiline -supplements like St. John's wort, kava kava, valerian -tramadol -tryptophan This list may not describe all possible interactions. Give your health care provider a list of all the medicines, herbs, non-prescription drugs, or dietary supplements you use. Also tell them if you smoke, drink alcohol, or use illegal drugs. Some items may interact with your medicine. What should I watch for while using this medicine? Tell your doctor if your symptoms do not get better or if they get worse. Visit your doctor or health care professional for regular checks on your progress. Because it may take several weeks to see the full effects of this medicine, it is important to continue your treatment as prescribed by your doctor. Patients and their families should watch out for new or  worsening thoughts of suicide or depression. Also watch out for  sudden changes in feelings such as feeling anxious, agitated, panicky, irritable, hostile, aggressive, impulsive, severely restless, overly excited and hyperactive, or not being able to sleep. If this happens, especially at the beginning of treatment or after a change in dose, call your health care professional. Dennis Bast may get drowsy or dizzy. Do not drive, use machinery, or do anything that needs mental alertness until you know how this medicine affects you. Do not stand or sit up quickly, especially if you are an older patient. This reduces the risk of dizzy or fainting spells. Alcohol may interfere with the effect of this medicine. Avoid alcoholic drinks. Your mouth may get dry. Chewing sugarless gum or sucking hard candy, and drinking plenty of water may help. Contact your doctor if the problem does not go away or is severe. What side effects may I notice from receiving this medicine? Side effects that you should report to your doctor or health care professional as soon as possible: -allergic reactions like skin rash, itching or hives, swelling of the face, lips, or tongue -anxious -black, tarry stools -changes in vision -confusion -elevated mood, decreased need for sleep, racing thoughts, impulsive behavior -eye pain -fast, irregular heartbeat -feeling faint or lightheaded, falls -feeling agitated, angry, or irritable -hallucination, loss of contact with reality -loss of balance or coordination -loss of memory -painful or prolonged erections -restlessness, pacing, inability to keep still -seizures -stiff muscles -suicidal thoughts or other mood changes -trouble sleeping -unusual bleeding or bruising -unusually weak or tired -vomiting Side effects that usually do not require medical attention (report to your doctor or health care professional if they continue or are bothersome): -changes in appetite -change in sex drive or  performance -headache -increased sweating -indigestion, nausea -tremors This list may not describe all possible side effects. Call your doctor for medical advice about side effects. You may report side effects to FDA at 1-800-FDA-1088. Where should I keep my medicine? Keep out of reach of children. Store at room temperature between 15 and 30 degrees C (59 and 86 degrees F). Throw away any unused medicine after the expiration date. NOTE: This sheet is a summary. It may not cover all possible information. If you have questions about this medicine, talk to your doctor, pharmacist, or health care provider.  2019 Elsevier/Gold Standard (2015-09-03 13:20:23) Preventive Care 18-39 Years, Female Preventive care refers to lifestyle choices and visits with your health care provider that can promote health and wellness. What does preventive care include?   A yearly physical exam. This is also called an annual well check.  Dental exams once or twice a year.  Routine eye exams. Ask your health care provider how often you should have your eyes checked.  Personal lifestyle choices, including: ? Daily care of your teeth and gums. ? Regular physical activity. ? Eating a healthy diet. ? Avoiding tobacco and drug use. ? Limiting alcohol use. ? Practicing safe sex. ? Taking vitamin and mineral supplements as recommended by your health care provider. What happens during an annual well check? The services and screenings done by your health care provider during your annual well check will depend on your age, overall health, lifestyle risk factors, and family history of disease. Counseling Your health care provider may ask you questions about your:  Alcohol use.  Tobacco use.  Drug use.  Emotional well-being.  Home and relationship well-being.  Sexual activity.  Eating habits.  Work and work Statistician.  Method of birth control.  Menstrual cycle.  Pregnancy history. Screening  You  may have the following tests or measurements:  Height, weight, and BMI.  Diabetes screening. This is done by checking your blood sugar (glucose) after you have not eaten for a while (fasting).  Blood pressure.  Lipid and cholesterol levels. These may be checked every 5 years starting at age 62.  Skin check.  Hepatitis C blood test.  Hepatitis B blood test.  Sexually transmitted disease (STD) testing.  BRCA-related cancer screening. This may be done if you have a family history of breast, ovarian, tubal, or peritoneal cancers.  Pelvic exam and Pap test. This may be done every 3 years starting at age 11. Starting at age 25, this may be done every 5 years if you have a Pap test in combination with an HPV test. Discuss your test results, treatment options, and if necessary, the need for more tests with your health care provider. Vaccines Your health care provider may recommend certain vaccines, such as:  Influenza vaccine. This is recommended every year.  Tetanus, diphtheria, and acellular pertussis (Tdap, Td) vaccine. You may need a Td booster every 10 years.  Varicella vaccine. You may need this if you have not been vaccinated.  HPV vaccine. If you are 51 or younger, you may need three doses over 6 months.  Measles, mumps, and rubella (MMR) vaccine. You may need at least one dose of MMR. You may also need a second dose.  Pneumococcal 13-valent conjugate (PCV13) vaccine. You may need this if you have certain conditions and were not previously vaccinated.  Pneumococcal polysaccharide (PPSV23) vaccine. You may need one or two doses if you smoke cigarettes or if you have certain conditions.  Meningococcal vaccine. One dose is recommended if you are age 37-21 years and a first-year college student living in a residence hall, or if you have one of several medical conditions. You may also need additional booster doses.  Hepatitis A vaccine. You may need this if you have certain  conditions or if you travel or work in places where you may be exposed to hepatitis A.  Hepatitis B vaccine. You may need this if you have certain conditions or if you travel or work in places where you may be exposed to hepatitis B.  Haemophilus influenzae type b (Hib) vaccine. You may need this if you have certain risk factors. Talk to your health care provider about which screenings and vaccines you need and how often you need them. This information is not intended to replace advice given to you by your health care provider. Make sure you discuss any questions you have with your health care provider. Document Released: 05/27/2001 Document Revised: 11/11/2016 Document Reviewed: 01/30/2015 Elsevier Interactive Patient Education  2019 [employer].

## 2018-10-05 NOTE — Progress Notes (Signed)
ANNUAL PREVENTATIVE CARE GYN  ENCOUNTER NOTE  Subjective:       Christina Warner is a 23 y.o. [phone] female here for a routine annual gynecologic exam.  Current complaints: 1.  Needs Pap smear 2.  Wishes to restart Lexapro  Denies difficulty breathing or respiratory distress, chest pain abdominal pain, excessive vaginal bleeding, dysuria, and leg pain or swelling.    Gynecologic History  Patient's last menstrual period was 08/15/2018 (exact date). Period Cycle (Days): 28 Period Duration (Days): 7 Period Pattern: Regular Menstrual Flow: Heavy, Moderate Menstrual Control: Tampon, Thin pad Dysmenorrhea: (!) Severe Dysmenorrhea Symptoms: Cramping  Contraception: IUD, Mirena inserted 08/20/2018  Last Pap: due  Obstetric History  OB History  Gravida Para Term Preterm AB Living  2 2 2     2   SAB TAB Ectopic Multiple Live Births        0 2    # Outcome Date GA Lbr Len/2nd Weight Sex Delivery Anes PTL Lv  2 Term 07/09/18 [redacted]w[redacted]d 05:42 / 00:16 8 lb 0.4 oz (3.64 kg) F Vag-Spont EPI  LIV  1 Term 06/01/14 [redacted]w[redacted]d  6 lb 9.1 oz (2.98 kg) F Vag-Spont EPI  LIV     Birth Comments: WNL    Past Medical History:  Diagnosis Date  . ADHD (attention deficit hyperactivity disorder)   . Asthma   . Depression     Past Surgical History:  Procedure Laterality Date  . TYMPANOSTOMY TUBE PLACEMENT      Current Outpatient Medications on File Prior to Visit  Medication Sig Dispense Refill  . albuterol (PROVENTIL HFA;VENTOLIN HFA) 108 (90 Base) MCG/ACT inhaler Inhale 2 puffs into the lungs every 6 (six) hours as needed for wheezing or shortness of breath. 1 Inhaler 2  . ferrous sulfate (IRON SUPPLEMENT) 325 (65 FE) MG tablet Take 1 tablet (325 mg total) by mouth daily with breakfast. 30 tablet 3  . ibuprofen (ADVIL,MOTRIN) 600 MG tablet Take 1 tablet (600 mg total) by mouth every 6 (six) hours. 30 tablet 0  . levonorgestrel (MIRENA) 20 MCG/24HR IUD 1 each by Intrauterine route once.    . Prenatal  Vit-Fe Fumarate-FA (PRENATAL MULTIVITAMIN) TABS tablet Take 1 tablet by mouth daily at 12 noon.     Current Facility-Administered Medications on File Prior to Visit  Medication Dose Route Frequency Provider Last Rate Last Dose  . lidocaine (PF) (XYLOCAINE) 1 % injection    Anesthesia Intra-op Lyndle Herrlich, MD   6 mL at 05/31/14 2144    No Known Allergies  Social History   Socioeconomic History  . Marital status: Married    Spouse name: [name]  . Number of children: 2  . Years of education: Not on file  . Highest education level: Not on file  Occupational History  . Not on file  Social Needs  . Financial resource strain: Not hard at all  . Food insecurity    Worry: Never true    Inability: Never true  . Transportation needs    Medical: No    Non-medical: No  Tobacco Use  . Smoking status: Current Every Day Smoker    Packs/day: 1.00    Types: Cigarettes    Start date: 04/15/2011  . Smokeless tobacco: Never Used  . Tobacco comment: Quit while pregnant  Substance and Sexual Activity  . Alcohol use: Yes    Alcohol/week: 5.0 standard drinks    Types: 5 Shots of liquor per week  . Drug use: Not Currently  Types: Marijuana  . Sexual activity: Yes    Birth control/protection: I.U.D.  Lifestyle  . Physical activity    Days per week: 0 days    Minutes per session: 0 min  . Stress: Very much  Relationships  . Social connections    Talks on phone: More than three times a week    Gets together: More than three times a week    Attends religious service: Never    Active member of club or organization: No    Attends meetings of clubs or organizations: Never    Relationship status: Married  . Intimate partner violence    Fear of current or ex partner: Not on file    Emotionally abused: Not on file    Physically abused: Not on file    Forced sexual activity: Not on file  Other Topics Concern  . Not on file  Social History Narrative  . Not on file    Family  History  Problem Relation Age of Onset  . Depression Mother   . Anxiety disorder Mother   . Depression Sister   . Breast cancer Neg Hx   . Ovarian cancer Neg Hx   . Colon cancer Neg Hx     The following portions of the patient's history were reviewed and updated as appropriate: allergies, current medications, past family history, past medical history, past social history, past surgical history and problem list.  Review of Systems  ROS negative except as noted above. Information obtained from patient.    Objective:   BP 116/82   Pulse 98   Ht 5' (1.524 m)   Wt 196 lb 8 oz (89.1 kg)   LMP 08/15/2018 (Exact Date)   Breastfeeding No   BMI 38.38 kg/m    CONSTITUTIONAL: Well-developed, well-nourished female in no acute distress.   PSYCHIATRIC: Normal mood and affect. Normal behavior. Normal judgment and thought content.  NEUROLGIC: Alert and oriented to person, place, and time. Normal muscle tone coordination. No cranial nerve deficit noted.  HENT:  Normocephalic, atraumatic, External right and left ear normal.   EYES: Conjunctivae and EOM are normal. Pupils are equal and round.    NECK: Normal range of motion, supple, no masses.  Normal thyroid.   SKIN: Skin is warm and dry. No rash noted. Not diaphoretic. No erythema. No pallor. Unprofessional tattoos present.   CARDIOVASCULAR: Normal heart rate noted, regular rhythm, no murmur.  RESPIRATORY: Clear to auscultation bilaterally. Effort and breath sounds normal, no problems with respiration noted.  BREASTS: Symmetric in size. No masses, skin changes, nipple drainage, or lymphadenopathy.  ABDOMEN: Soft, normal bowel sounds, no distention noted.  No tenderness, rebound or guarding.   PELVIC:  External Genitalia: Normal  Vagina: Normal  Cervix: Normal, IUD strings present at os, Pap collected Uterus: Normal  Adnexa: Normal  MUSCULOSKELETAL: Normal range of motion. No tenderness.  No cyanosis, clubbing, or edema.  2+  distal pulses.  LYMPHATIC: No Axillary, Supraclavicular, or Inguinal Adenopathy.  GAD 7 : Generalized Anxiety Score 10/05/2018  Nervous, Anxious, on Edge 1  Control/stop worrying 0  Worry too much - different things 0  Trouble relaxing 1  Restless 0  Easily annoyed or irritable 3  Afraid - awful might happen 0  Total GAD 7 Score 5  Anxiety Difficulty Somewhat difficult    Depression screen [facility] 2/9 10/05/2018 08/20/2018  Decreased Interest 1 0  Down, Depressed, Hopeless 1 1  PHQ - 2 Score 2 1  Altered sleeping 1 0  Tired, decreased energy 1 0  Change in appetite 1 0  Feeling bad or failure about yourself  1 0  Trouble concentrating 0 0  Moving slowly or fidgety/restless 0 0  Suicidal thoughts 0 0  PHQ-9 Score 6 1  Difficult doing work/chores Somewhat difficult -  Some encounter information is confidential and restricted. Go to Review Flowsheets activity to see all data.    Assessment:   Annual gynecologic examination 23 y.o.   Contraception: IUD, Mirena   Obesity 2   Problem List Items Addressed This Visit    None    Visit Diagnoses    Well woman exam    -  Primary   Screening for cervical cancer       IUD (intrauterine device) in place       Anxiety and depression       Relevant Medications   escitalopram (LEXAPRO) 10 MG tablet      Plan:   Pap: Pap, Reflex if ASCUS  Labs: Declined   Routine preventative health maintenance measures emphasized: Exercise/Diet/Weight control, Tobacco Warnings, Alcohol/Substance use risks, Stress Management, Peer Pressure Issues and Safe Sex; see AVS  Rx: Lexapro, see orders  Reviewed red flag symptoms and when to call  RTC x 6-8 wks for medication check  RTC x 1 year for ANNUAL EXAM or sooner if needed    Gunnar BullaJenkins Michelle Colburn Asper, CNM [facility], [facility] 10/05/18 2:37 PM

## 2018-10-05 NOTE — Progress Notes (Signed)
Patient here for annual exam and follow-up after having Mirena IUD inserted.  Patient will like to discuss options to help with depression.

## 2018-10-08 LAB — CYTOLOGY - PAP: Diagnosis: NEGATIVE

## 2018-11-16 ENCOUNTER — Ambulatory Visit (INDEPENDENT_AMBULATORY_CARE_PROVIDER_SITE_OTHER): Payer: Medicaid Other | Admitting: Certified Nurse Midwife

## 2018-11-16 ENCOUNTER — Other Ambulatory Visit: Payer: Self-pay

## 2018-11-16 ENCOUNTER — Encounter: Payer: Self-pay | Admitting: Certified Nurse Midwife

## 2018-11-16 VITALS — BP 116/80 | HR 85 | Ht 60.0 in | Wt 192.3 lb

## 2018-11-16 DIAGNOSIS — Z09 Encounter for follow-up examination after completed treatment for conditions other than malignant neoplasm: Secondary | ICD-10-CM | POA: Diagnosis not present

## 2018-11-16 DIAGNOSIS — F329 Major depressive disorder, single episode, unspecified: Secondary | ICD-10-CM

## 2018-11-16 DIAGNOSIS — F419 Anxiety disorder, unspecified: Secondary | ICD-10-CM

## 2018-11-16 DIAGNOSIS — F32A Depression, unspecified: Secondary | ICD-10-CM

## 2018-11-16 MED ORDER — ESCITALOPRAM OXALATE 10 MG PO TABS: 10.0000 mg | ORAL_TABLET | Freq: Every day | ORAL | 5 refills | Status: DC

## 2018-11-16 NOTE — Progress Notes (Signed)
Patient here to follow-up after starting Lexapro, feels better and plans to continue taking.

## 2018-11-16 NOTE — Patient Instructions (Signed)
Escitalopram tablets What is this medicine? ESCITALOPRAM (es sye TAL oh pram) is used to treat depression and certain types of anxiety. This medicine may be used for other purposes; ask your health care provider or pharmacist if you have questions. COMMON BRAND NAME(S): Lexapro What should I tell my health care provider before I take this medicine? They need to know if you have any of these conditions:  bipolar disorder or a family history of bipolar disorder  diabetes  glaucoma  heart disease  kidney or liver disease  receiving electroconvulsive therapy  seizures (convulsions)  suicidal thoughts, plans, or attempt by you or a family member  an unusual or allergic reaction to escitalopram, the related drug citalopram, other medicines, foods, dyes, or preservatives  pregnant or trying to become pregnant  breast-feeding How should I use this medicine? Take this medicine by mouth with a glass of water. Follow the directions on the prescription label. You can take it with or without food. If it upsets your stomach, take it with food. Take your medicine at regular intervals. Do not take it more often than directed. Do not stop taking this medicine suddenly except upon the advice of your doctor. Stopping this medicine too quickly may cause serious side effects or your condition may worsen. A special MedGuide will be given to you by the pharmacist with each prescription and refill. Be sure to read this information carefully each time. Talk to your pediatrician regarding the use of this medicine in children. Special care may be needed. Overdosage: If you think you have taken too much of this medicine contact a poison control center or emergency room at once. NOTE: This medicine is only for you. Do not share this medicine with others. What if I miss a dose? If you miss a dose, take it as soon as you can. If it is almost time for your next dose, take only that dose. Do not take double or  extra doses. What may interact with this medicine? Do not take this medicine with any of the following medications:  certain medicines for fungal infections like fluconazole, itraconazole, ketoconazole, posaconazole, voriconazole  cisapride  citalopram  dronedarone  linezolid  MAOIs like Carbex, Eldepryl, Marplan, Nardil, and Parnate  methylene blue (injected into a vein)  pimozide  thioridazine This medicine may also interact with the following medications:  alcohol  amphetamines  aspirin and aspirin-like medicines  carbamazepine  certain medicines for depression, anxiety, or psychotic disturbances  certain medicines for migraine headache like almotriptan, eletriptan, frovatriptan, naratriptan, rizatriptan, sumatriptan, zolmitriptan  certain medicines for sleep  certain medicines that treat or prevent blood clots like warfarin, enoxaparin, dalteparin  cimetidine  diuretics  dofetilide  fentanyl  furazolidone  isoniazid  lithium  metoprolol  NSAIDs, medicines for pain and inflammation, like ibuprofen or naproxen  other medicines that prolong the QT interval (cause an abnormal heart rhythm)  procarbazine  rasagiline  supplements like St. John's wort, kava kava, valerian  tramadol  tryptophan  ziprasidone This list may not describe all possible interactions. Give your health care provider a list of all the medicines, herbs, non-prescription drugs, or dietary supplements you use. Also tell them if you smoke, drink alcohol, or use illegal drugs. Some items may interact with your medicine. What should I watch for while using this medicine? Tell your doctor if your symptoms do not get better or if they get worse. Visit your doctor or health care professional for regular checks on your progress. Because it may   take several weeks to see the full effects of this medicine, it is important to continue your treatment as prescribed by your doctor. Patients  and their families should watch out for new or worsening thoughts of suicide or depression. Also watch out for sudden changes in feelings such as feeling anxious, agitated, panicky, irritable, hostile, aggressive, impulsive, severely restless, overly excited and hyperactive, or not being able to sleep. If this happens, especially at the beginning of treatment or after a change in dose, call your health care professional. You may get drowsy or dizzy. Do not drive, use machinery, or do anything that needs mental alertness until you know how this medicine affects you. Do not stand or sit up quickly, especially if you are an older patient. This reduces the risk of dizzy or fainting spells. Alcohol may interfere with the effect of this medicine. Avoid alcoholic drinks. Your mouth may get dry. Chewing sugarless gum or sucking hard candy, and drinking plenty of water may help. Contact your doctor if the problem does not go away or is severe. What side effects may I notice from receiving this medicine? Side effects that you should report to your doctor or health care professional as soon as possible:  allergic reactions like skin rash, itching or hives, swelling of the face, lips, or tongue  anxious  black, tarry stools  changes in vision  confusion  elevated mood, decreased need for sleep, racing thoughts, impulsive behavior  eye pain  fast, irregular heartbeat  feeling faint or lightheaded, falls  feeling agitated, angry, or irritable  hallucination, loss of contact with reality  loss of balance or coordination  loss of memory  painful or prolonged erections  restlessness, pacing, inability to keep still  seizures  stiff muscles  suicidal thoughts or other mood changes  trouble sleeping  unusual bleeding or bruising  unusually weak or tired  vomiting Side effects that usually do not require medical attention (report to your doctor or health care professional if they  continue or are bothersome):  changes in appetite  change in sex drive or performance  headache  increased sweating  indigestion, nausea  tremors This list may not describe all possible side effects. Call your doctor for medical advice about side effects. You may report side effects to FDA at 1-800-FDA-1088. Where should I keep my medicine? Keep out of reach of children. Store at room temperature between 15 and 30 degrees C (59 and 86 degrees F). Throw away any unused medicine after the expiration date. NOTE: This sheet is a summary. It may not cover all possible information. If you have questions about this medicine, talk to your doctor, pharmacist, or health care provider.  2020 Elsevier/Gold Standard (2018-03-22 11:21:44)  

## 2018-11-16 NOTE — Progress Notes (Signed)
GYN ENCOUNTER NOTE  Subjective:       Christina Warner is a 23 y.o. [phone] female here for follow up after started Lexapro for management of anxiety and depression.   Feeling better, wishes to continue medication as prescribed. No SI/HI.   Denies difficulty breathing or respiratory distress, chest pain, abdominal pain, excessive vaginal bleeding, dysuria, and leg pain or swelling.    Gynecologic History  Patient's last menstrual period was 11/11/2018 (exact date). Period Cycle (Days): 28 Period Duration (Days): 8 Period Pattern: Regular Menstrual Flow: Light, Moderate Menstrual Control: Tampon Dysmenorrhea: (!) Severe Dysmenorrhea Symptoms: Cramping  Contraception: IUD, Mirena  Last Pap: 09/2018. Results were: normal  Obstetric History  OB History  Gravida Para Term Preterm AB Living  2 2 2     2   SAB TAB Ectopic Multiple Live Births        0 2    # Outcome Date GA Lbr Len/2nd Weight Sex Delivery Anes PTL Lv  2 Term 07/09/18 [redacted]w[redacted]d 05:42 / 00:16 8 lb 0.4 oz (3.64 kg) F Vag-Spont EPI  LIV  1 Term 06/01/14 [redacted]w[redacted]d  6 lb 9.1 oz (2.98 kg) F Vag-Spont EPI  LIV     Birth Comments: WNL    Past Medical History:  Diagnosis Date  . ADHD (attention deficit hyperactivity disorder)   . Asthma   . Depression     Past Surgical History:  Procedure Laterality Date  . TYMPANOSTOMY TUBE PLACEMENT      Current Outpatient Medications on File Prior to Visit  Medication Sig Dispense Refill  . albuterol (PROVENTIL HFA;VENTOLIN HFA) 108 (90 Base) MCG/ACT inhaler Inhale 2 puffs into the lungs every 6 (six) hours as needed for wheezing or shortness of breath. 1 Inhaler 2  . escitalopram (LEXAPRO) 10 MG tablet Take 1 tablet (10 mg total) by mouth daily. 30 tablet 1  . ferrous sulfate (IRON SUPPLEMENT) 325 (65 FE) MG tablet Take 1 tablet (325 mg total) by mouth daily with breakfast. 30 tablet 3  . ibuprofen (ADVIL,MOTRIN) 600 MG tablet Take 1 tablet (600 mg total) by mouth every 6 (six) hours.  30 tablet 0  . levonorgestrel (MIRENA) 20 MCG/24HR IUD 1 each by Intrauterine route once.    . Prenatal Vit-Fe Fumarate-FA (PRENATAL MULTIVITAMIN) TABS tablet Take 1 tablet by mouth daily at 12 noon.     Current Facility-Administered Medications on File Prior to Visit  Medication Dose Route Frequency Provider Last Rate Last Dose  . lidocaine (PF) (XYLOCAINE) 1 % injection    Anesthesia Intra-op Lyndle Herrlich, MD   6 mL at 05/31/14 2144    No Known Allergies  Social History   Socioeconomic History  . Marital status: Married    Spouse name: [name]  . Number of children: 1  . Years of education: Not on file  . Highest education level: Not on file  Occupational History  . Not on file  Social Needs  . Financial resource strain: Not hard at all  . Food insecurity    Worry: Never true    Inability: Never true  . Transportation needs    Medical: No    Non-medical: No  Tobacco Use  . Smoking status: Current Every Day Smoker    Packs/day: 1.00    Types: Cigarettes    Start date: 04/15/2011  . Smokeless tobacco: Never Used  . Tobacco comment: Quit while pregnant  Substance and Sexual Activity  . Alcohol use: Yes    Alcohol/week: 5.0 standard drinks  Types: 5 Shots of liquor per week  . Drug use: Not Currently    Types: Marijuana  . Sexual activity: Yes    Birth control/protection: I.U.D.  Lifestyle  . Physical activity    Days per week: 0 days    Minutes per session: 0 min  . Stress: Very much  Relationships  . Social connections    Talks on phone: More than three times a week    Gets together: More than three times a week    Attends religious service: Never    Active member of club or organization: No    Attends meetings of clubs or organizations: Never    Relationship status: Married  . Intimate partner violence    Fear of current or ex partner: Not on file    Emotionally abused: Not on file    Physically abused: Not on file    Forced sexual activity: Not  on file  Other Topics Concern  . Not on file  Social History Narrative  . Not on file    Family History  Problem Relation Age of Onset  . Depression Mother   . Anxiety disorder Mother   . Depression Sister   . Breast cancer Neg Hx   . Ovarian cancer Neg Hx   . Colon cancer Neg Hx     The following portions of the patient's history were reviewed and updated as appropriate: allergies, current medications, past family history, past medical history, past social history, past surgical history and problem list.  Review of Systems  ROS negative except as noted above. Information obtained from patient.   Objective:   BP 116/80   Pulse 85   Ht 5' (1.524 m)   Wt 192 lb 4.8 oz (87.2 kg)   LMP 11/11/2018 (Exact Date)   BMI 37.56 kg/m    CONSTITUTIONAL: Well-developed, well-nourished female in no acute distress.   PHYSICAL EXAM: Not indicated.   GAD 7 : Generalized Anxiety Score 11/16/2018 10/05/2018  Nervous, Anxious, on Edge 0 1  Control/stop worrying 1 0  Worry too much - different things 0 0  Trouble relaxing 1 1  Restless 0 0  Easily annoyed or irritable 1 3  Afraid - awful might happen 0 0  Total GAD 7 Score 3 5  Anxiety Difficulty Somewhat difficult Somewhat difficult    Dep[facility] screen [facility] 11/16/2018 10/05/2018 08/20/2018  Decreased Interest 1 1 0  Down, Depressed, Hopeless 1 1 1   PHQ - 2 Score 2 2 1   Altered sleeping 1 1 0  Tired, decreased energy 1 1 0  Change in appetite 0 1 0  Feeling bad or failure about yourself  1 1 0  Trouble concentrating 1 0 0  Moving slowly or fidgety/restless 0 0 0  Suicidal thoughts 1 0 0  PHQ-9 Score 7 6 1   Difficult doing work/chores Somewhat difficult Somewhat difficult -  Some encounter information is confidential and restricted. Go to Review Flowsheets activity to see all data.    Assessment:   1. Anxiety and depression   2. Follow up   Plan:   Rx: Lexapro, see orders  Reviewed red flag symptoms and when to  call  RTC x 6 months for follow up or sooner if needed   Gunnar BullaJenkins Michelle Shellia Hartl, CNM [facility] 11/16/18 2:04 PM

## 2019-03-01 ENCOUNTER — Telehealth: Payer: Self-pay

## 2019-03-01 NOTE — Telephone Encounter (Signed)
Pt has right breast lump for four days. P request appt. No appts this week with Deneise Lever or [place]. Pt states preferably Wed or Thursday. Pt does not want to wait until next week. Please advise.

## 2019-03-02 NOTE — Telephone Encounter (Signed)
Called and spoke with patient.  Patient c/o right breast pimple x5 days, red, irritated, draining after she popped herself yesterday, feels somewhat better but "there's still stuff in it".  Patient denies pain or fever.  Advised patient to apply warm compresses and keep area clean since it is now open.  Patient aware if symptoms get worse to go to the ER or urgent care.  Appointment scheduled with JML for 11/20 and patient verbalized understanding.

## 2019-03-04 ENCOUNTER — Encounter: Payer: Self-pay | Admitting: Certified Nurse Midwife

## 2019-03-04 ENCOUNTER — Other Ambulatory Visit: Payer: Self-pay

## 2019-03-04 ENCOUNTER — Ambulatory Visit (INDEPENDENT_AMBULATORY_CARE_PROVIDER_SITE_OTHER): Payer: Medicaid Other | Admitting: Certified Nurse Midwife

## 2019-03-04 VITALS — BP 103/81 | HR 94 | Ht 60.0 in | Wt 192.2 lb

## 2019-03-04 DIAGNOSIS — N611 Abscess of the breast and nipple: Secondary | ICD-10-CM | POA: Diagnosis not present

## 2019-03-04 MED ORDER — CLINDAMYCIN HCL 300 MG PO CAPS: 300.0000 mg | ORAL_CAPSULE | Freq: Three times a day (TID) | ORAL | 0 refills | Status: AC

## 2019-03-04 NOTE — Progress Notes (Signed)
Patient c/o pimple on right breast x1 week, area is red and irritated, not draining anymore.  Patient using peroxide.

## 2019-03-04 NOTE — Patient Instructions (Signed)
Clindamycin capsules What is this medicine? CLINDAMYCIN (Dupont sin) is a lincosamide antibiotic. It is used to treat certain kinds of bacterial infections. It will not work for colds, flu, or other viral infections. This medicine may be used for other purposes; ask your health care provider or pharmacist if you have questions. COMMON BRAND NAME(S): Cleocin What should I tell my health care provider before I take this medicine? They need to know if you have any of these conditions:  kidney disease  liver disease  stomach problems like colitis  an unusual or allergic reaction to clindamycin, lincomycin, or other medicines, foods, dyes like tartrazine or preservatives  pregnant or trying to get pregnant  breast-feeding How should I use this medicine? Take this medicine by mouth with a full glass of water. Follow the directions on the prescription label. You can take this medicine with food or on an empty stomach. If the medicine upsets your stomach, take it with food. Take your medicine at regular intervals. Do not take your medicine more often than directed. Take all of your medicine as directed even if you think your are better. Do not skip doses or stop your medicine early. Talk to your pediatrician regarding the use of this medicine in children. Special care may be needed. Overdosage: If you think you have taken too much of this medicine contact a poison control center or emergency room at once. NOTE: This medicine is only for you. Do not share this medicine with others. What if I miss a dose? If you miss a dose, take it as soon as you can. If it is almost time for your next dose, take only that dose. Do not take double or extra doses. What may interact with this medicine?  birth control pills  erythromycin  medicines that relax muscles for surgery  rifampin This list may not describe all possible interactions. Give your health care provider a list of all the medicines,  herbs, non-prescription drugs, or dietary supplements you use. Also tell them if you smoke, drink alcohol, or use illegal drugs. Some items may interact with your medicine. What should I watch for while using this medicine? Tell your doctor or health care provider if your symptoms do not start to get better or if they get worse. This medicine may cause serious skin reactions. They can happen weeks to months after starting the medicine. Contact your health care provider right away if you notice fevers or flu-like symptoms with a rash. The rash may be red or purple and then turn into blisters or peeling of the skin. Or, you might notice a red rash with swelling of the face, lips or lymph nodes in your neck or under your arms. Do not treat diarrhea with over the counter products. Contact your doctor if you have diarrhea that lasts more than 2 days or if it is severe and watery. What side effects may I notice from receiving this medicine? Side effects that you should report to your doctor or health care professional as soon as possible:  allergic reactions like skin rash, itching or hives, swelling of the face, lips, or tongue  dark urine  pain on swallowing  rash, fever, and swollen lymph nodes  redness, blistering, peeling or loosening of the skin, including inside the mouth  unusual bleeding or bruising  unusually weak or tired  yellowing of eyes or skin Side effects that usually do not require medical attention (report to your doctor or health care professional  if they continue or are bothersome):  diarrhea  itching in the rectal or genital area  joint pain  nausea, vomiting  stomach pain This list may not describe all possible side effects. Call your doctor for medical advice about side effects. You may report side effects to FDA at 1-800-FDA-1088. Where should I keep my medicine? Keep out of the reach of children. Store at room temperature between 20 and 25 degrees C (68 and 77  degrees F). Throw away any unused medicine after the expiration date. NOTE: This sheet is a summary. It may not cover all possible information. If you have questions about this medicine, talk to your doctor, pharmacist, or health care provider.  2020 Elsevier/Gold Standard (2018-07-01 12:02:12) Skin Abscess  A skin abscess is an infected area of your skin that contains pus and other material. An abscess can happen in any part of your body. Some abscesses break open (rupture) on their own. Most continue to get worse unless they are treated. The infection can spread deeper into the body and into your blood, which can make you feel sick. A skin abscess is caused by germs that enter the skin through a cut or scrape. It can also be caused by blocked oil and sweat glands or infected hair follicles. This condition is usually treated by:  Draining the pus.  Taking antibiotic medicines.  Placing a warm, wet washcloth over the abscess. Follow these instructions at home: Medicines   Take over-the-counter and prescription medicines only as told by your doctor.  If you were prescribed an antibiotic medicine, take it as told by your doctor. Do not stop taking the antibiotic even if you start to feel better. Abscess care   If you have an abscess that has not drained, place a warm, clean, wet washcloth over the abscess several times a day. Do this as told by your doctor.  Follow instructions from your doctor about how to take care of your abscess. Make sure you: ? Cover the abscess with a bandage (dressing). ? Change your bandage or gauze as told by your doctor. ? Wash your hands with soap and water before you change the bandage or gauze. If you cannot use soap and water, use hand sanitizer.  Check your abscess every day for signs that the infection is getting worse. Check for: ? More redness, swelling, or pain. ? More fluid or blood. ? Warmth. ? More pus or a bad smell. General instructions   To avoid spreading the infection: ? Do not share personal care items, towels, or hot tubs with others. ? Avoid making skin-to-skin contact with other people.  Keep all follow-up visits as told by your doctor. This is important. Contact a doctor if:  You have more redness, swelling, or pain around your abscess.  You have more fluid or blood coming from your abscess.  Your abscess feels warm when you touch it.  You have more pus or a bad smell coming from your abscess.  You have a fever.  Your muscles ache.  You have chills.  You feel sick. Get help right away if:  You have very bad (severe) pain.  You see red streaks on your skin spreading away from the abscess. Summary  A skin abscess is an infected area of your skin that contains pus and other material.  The abscess is caused by germs that enter the skin through a cut or scrape. It can also be caused by blocked oil and sweat glands or infected  hair follicles.  Follow your doctor's instructions on caring for your abscess, taking medicines, preventing infections, and keeping follow-up visits. This information is not intended to replace advice given to you by your health care provider. Make sure you discuss any questions you have with your health care provider. Document Released: 09/17/2007 Document Revised: 07/22/2018 Document Reviewed: 05/14/2017 Elsevier Patient Education  2020 [employer].

## 2019-03-04 NOTE — Progress Notes (Signed)
GYN ENCOUNTER NOTE  Subjective:       Christina Warner is a 23 y.o. [phone] female is here for gynecologic evaluation of the following issues:  1. "Pimple" on right breast for the last week; "Popped it and picks at it"; hurts when squeezed  Notes redness and irritation, but no drainage for the last few days.   Using hydrogen peroxide. Concerned for breast cancer.   Denies difficulty breathing or respiratory distress, chest pain, abdominal pain, excessive vaginal bleeding, dysuria, and leg pain or swelling.     Gynecologic History  Patient's last menstrual period was 02/03/2019 (exact date). Period Cycle (Days): 30 Period Duration (Days): 14 Period Pattern: Regular Menstrual Flow: Light Menstrual Control: Tampon Dysmenorrhea: (!) Moderate Dysmenorrhea Symptoms: Cramping  Contraception: IUD, Mirena  Last Pap: 09/2018. Results were: normal  Obstetric History  OB History  Gravida Para Term Preterm AB Living  2 2 2     2   SAB TAB Ectopic Multiple Live Births        0 2    # Outcome Date GA Lbr Len/2nd Weight Sex Delivery Anes PTL Lv  2 Term 07/09/18 [redacted]w[redacted]d 05:42 / 00:16 8 lb 0.4 oz (3.64 kg) F Vag-Spont EPI  LIV  1 Term 06/01/14 [redacted]w[redacted]d  6 lb 9.1 oz (2.98 kg) F Vag-Spont EPI  LIV     Birth Comments: WNL    Past Medical History:  Diagnosis Date  . ADHD (attention deficit hyperactivity disorder)   . Asthma   . Depression     Past Surgical History:  Procedure Laterality Date  . TYMPANOSTOMY TUBE PLACEMENT      Current Outpatient Medications on File Prior to Visit  Medication Sig Dispense Refill  . albuterol (PROVENTIL HFA;VENTOLIN HFA) 108 (90 Base) MCG/ACT inhaler Inhale 2 puffs into the lungs every 6 (six) hours as needed for wheezing or shortness of breath. 1 Inhaler 2  . escitalopram (LEXAPRO) 10 MG tablet Take 1 tablet (10 mg total) by mouth daily. 30 tablet 5  . ferrous sulfate (IRON SUPPLEMENT) 325 (65 FE) MG tablet Take 1 tablet (325 mg total) by mouth daily with  breakfast. 30 tablet 3  . ibuprofen (ADVIL,MOTRIN) 600 MG tablet Take 1 tablet (600 mg total) by mouth every 6 (six) hours. 30 tablet 0  . levonorgestrel (MIRENA) 20 MCG/24HR IUD 1 each by Intrauterine route once.     Current Facility-Administered Medications on File Prior to Visit  Medication Dose Route Frequency Provider Last Rate Last Dose  . lidocaine (PF) (XYLOCAINE) 1 % injection    Anesthesia Intra-op Lyndle Herrlich, MD   6 mL at 05/31/14 2144    No Known Allergies  Social History   Socioeconomic History  . Marital status: Married    Spouse name: [name]  . Number of children: 1  . Years of education: Not on file  . Highest education level: Not on file  Occupational History  . Not on file  Social Needs  . Financial resource strain: Not hard at all  . Food insecurity    Worry: Never true    Inability: Never true  . Transportation needs    Medical: No    Non-medical: No  Tobacco Use  . Smoking status: Current Every Day Smoker    Packs/day: 1.00    Types: Cigarettes    Start date: 04/15/2011  . Smokeless tobacco: Never Used  . Tobacco comment: Quit while pregnant  Substance and Sexual Activity  . Alcohol use: Yes  Alcohol/week: 5.0 standard drinks    Types: 5 Shots of liquor per week  . Drug use: Yes    Frequency: 1.0 times per week    Types: Marijuana  . Sexual activity: Yes    Birth control/protection: I.U.D.  Lifestyle  . Physical activity    Days per week: 0 days    Minutes per session: 0 min  . Stress: Very much  Relationships  . Social connections    Talks on phone: More than three times a week    Gets together: More than three times a week    Attends religious service: Never    Active member of club or organization: No    Attends meetings of clubs or organizations: Never    Relationship status: Married  . Intimate partner violence    Fear of current or ex partner: Not on file    Emotionally abused: Not on file    Physically abused: Not  on file    Forced sexual activity: Not on file  Other Topics Concern  . Not on file  Social History Narrative  . Not on file    Family History  Problem Relation Age of Onset  . Depression Mother   . Anxiety disorder Mother   . Depression Sister   . Breast cancer Neg Hx   . Ovarian cancer Neg Hx   . Colon cancer Neg Hx     The following portions of the patient's history were reviewed and updated as appropriate: allergies, current medications, past family history, past medical history, past social history, past surgical history and problem list.  Review of Systems  ROS negative except as noted above. Information obtained from patient.   Objective:   BP 103/81   Pulse 94   Ht 5' (1.524 m)   Wt 192 lb 3.2 oz (87.2 kg)   LMP 02/03/2019 (Exact Date)   Breastfeeding No   BMI 37.54 kg/m    CONSTITUTIONAL: Well-developed, well-nourished female in no acute distress.   BREASTS: Pencil eraser sized pustule at 12 o'clock on right breast with red halo; culture collected.   Assessment:   1. Breast abscess  - Anaerobic and Aerobic Culture   Plan:   Culture collected, see orders.   Rx: Clindamycin, see orders.   Discussed home treatment measures.   Reviewed red flag symptoms and when to call.   RTC if symptoms worsen or fail to improve.    Gunnar Bulla, CNM [facility], [facility]

## 2019-03-11 LAB — ANAEROBIC AND AEROBIC CULTURE

## 2019-03-21 ENCOUNTER — Encounter (HOSPITAL_COMMUNITY): Payer: Self-pay | Admitting: Emergency Medicine

## 2019-03-21 ENCOUNTER — Other Ambulatory Visit: Payer: Self-pay

## 2019-03-21 ENCOUNTER — Emergency Department (HOSPITAL_COMMUNITY)
Admission: EM | Admit: 2019-03-21 | Discharge: 2019-03-21 | Disposition: A | Discharge status: Home / Self Care | Payer: Medicaid Other | Attending: Emergency Medicine | Admitting: Emergency Medicine

## 2019-03-21 DIAGNOSIS — Z5321 Procedure and treatment not carried out due to patient leaving prior to being seen by health care provider: Secondary | ICD-10-CM | POA: Diagnosis not present

## 2019-03-21 DIAGNOSIS — Z20828 Contact with and (suspected) exposure to other viral communicable diseases: Secondary | ICD-10-CM | POA: Insufficient documentation

## 2019-03-21 NOTE — ED Notes (Signed)
Patient states she has been here for hours and is not waiting another four hours. This tech explained that it might not be another four hours but she insisted on leaving and coming back in the morning. I advised her the wait might be the same and it would be easier to just stay. She states "my head hurts my leg hurts and I dont feel good. im leaving"

## 2019-03-21 NOTE — ED Triage Notes (Signed)
Pt st's she lives with someone that is Covid + and now she has had a cough and shortness of breath.  Pt also c/o abscess on left upper inner thigh

## 2019-05-19 ENCOUNTER — Emergency Department (HOSPITAL_COMMUNITY)
Admission: EM | Admit: 2019-05-19 | Discharge: 2019-05-19 | Disposition: A | Discharge status: Home / Self Care | Payer: Medicaid Other | Attending: Emergency Medicine | Admitting: Emergency Medicine

## 2019-05-19 ENCOUNTER — Other Ambulatory Visit: Payer: Self-pay

## 2019-05-19 ENCOUNTER — Encounter (HOSPITAL_COMMUNITY): Payer: Self-pay

## 2019-05-19 DIAGNOSIS — J45909 Unspecified asthma, uncomplicated: Secondary | ICD-10-CM | POA: Diagnosis not present

## 2019-05-19 DIAGNOSIS — L02416 Cutaneous abscess of left lower limb: Secondary | ICD-10-CM | POA: Diagnosis not present

## 2019-05-19 DIAGNOSIS — F17210 Nicotine dependence, cigarettes, uncomplicated: Secondary | ICD-10-CM | POA: Insufficient documentation

## 2019-05-19 DIAGNOSIS — M79652 Pain in left thigh: Secondary | ICD-10-CM | POA: Diagnosis present

## 2019-05-19 DIAGNOSIS — Z79899 Other long term (current) drug therapy: Secondary | ICD-10-CM | POA: Diagnosis not present

## 2019-05-19 DIAGNOSIS — R21 Rash and other nonspecific skin eruption: Secondary | ICD-10-CM

## 2019-05-19 MED ORDER — DOXYCYCLINE HYCLATE 100 MG PO TABS
100.0000 mg | ORAL_TABLET | Freq: Once | ORAL | Status: AC
  Administered 2019-05-19: 100 mg via ORAL
  Filled 2019-05-19: qty 1

## 2019-05-19 MED ORDER — LIDOCAINE-EPINEPHRINE (PF) 2 %-1:200000 IJ SOLN
20.0000 mL | Freq: Once | INTRAMUSCULAR | Status: AC
  Administered 2019-05-19: 20 mL
  Filled 2019-05-19: qty 20

## 2019-05-19 MED ORDER — KETOCONAZOLE 2 % EX CREA: 1.0000 "application " | TOPICAL_CREAM | Freq: Every day | CUTANEOUS | 0 refills | Status: DC

## 2019-05-19 MED ORDER — DOXYCYCLINE HYCLATE 100 MG PO CAPS: 100.0000 mg | ORAL_CAPSULE | Freq: Two times a day (BID) | ORAL | 0 refills | Status: AC

## 2019-05-19 MED ORDER — HYDROCODONE-ACETAMINOPHEN 5-325 MG PO TABS
1.0000 | ORAL_TABLET | Freq: Once | ORAL | Status: AC
  Administered 2019-05-19: 1 via ORAL
  Filled 2019-05-19: qty 1

## 2019-05-19 NOTE — ED Notes (Signed)
Patient verbalizes understanding of discharge instructions. Opportunity for questioning and answers were provided. Armband removed by staff, pt discharged from ED to home 

## 2019-05-19 NOTE — ED Triage Notes (Signed)
Pt reports large abscess to left inner thigh for the past 4 days, warm to touch, pt tried draining it but hurt too much to continue. Denies fever

## 2019-05-19 NOTE — Discharge Instructions (Signed)
Take antibiotics as prescribed.  Take entire course, even if your symptoms improve. Wash daily with soap and water.  Otherwise, do not irritate. Use a warm compress to help with pain and swelling. Return to the emergency room if you develop high fevers, nausea/vomiting, weakness, worsening pus coming from the area, worsening pain, or any new, worsening, or concerning symptoms.
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
## 2019-05-31 ENCOUNTER — Encounter: Payer: Medicaid Other | Admitting: Certified Nurse Midwife

## 2019-06-09 ENCOUNTER — Encounter (HOSPITAL_COMMUNITY): Payer: Self-pay | Admitting: Emergency Medicine

## 2019-06-09 ENCOUNTER — Emergency Department (HOSPITAL_COMMUNITY)
Admission: EM | Admit: 2019-06-09 | Discharge: 2019-06-09 | Disposition: A | Discharge status: Home / Self Care | Payer: Medicaid Other | Attending: Emergency Medicine | Admitting: Emergency Medicine

## 2019-06-09 DIAGNOSIS — F1910 Other psychoactive substance abuse, uncomplicated: Secondary | ICD-10-CM | POA: Insufficient documentation

## 2019-06-09 DIAGNOSIS — F17210 Nicotine dependence, cigarettes, uncomplicated: Secondary | ICD-10-CM | POA: Diagnosis not present

## 2019-06-09 DIAGNOSIS — R42 Dizziness and giddiness: Secondary | ICD-10-CM | POA: Diagnosis not present

## 2019-06-09 DIAGNOSIS — Z79899 Other long term (current) drug therapy: Secondary | ICD-10-CM | POA: Diagnosis not present

## 2019-06-09 DIAGNOSIS — R5383 Other fatigue: Secondary | ICD-10-CM | POA: Diagnosis not present

## 2019-06-09 LAB — CBC WITH DIFFERENTIAL/PLATELET
Abs Immature Granulocytes: 0.03 10*3/uL (ref 0.00–0.07)
Basophils Absolute: 0 10*3/uL (ref 0.0–0.1)
Basophils Relative: 1 %
Eosinophils Absolute: 0.3 10*3/uL (ref 0.0–0.5)
Eosinophils Relative: 3 %
HCT: 40.8 % (ref 36.0–46.0)
Hemoglobin: 13.3 g/dL (ref 12.0–15.0)
Immature Granulocytes: 0 %
Lymphocytes Relative: 35 %
Lymphs Abs: 2.9 10*3/uL (ref 0.7–4.0)
MCH: 30.4 pg (ref 26.0–34.0)
MCHC: 32.6 g/dL (ref 30.0–36.0)
MCV: 93.4 fL (ref 80.0–100.0)
Monocytes Absolute: 0.5 10*3/uL (ref 0.1–1.0)
Monocytes Relative: 6 %
Neutro Abs: 4.5 10*3/uL (ref 1.7–7.7)
Neutrophils Relative %: 55 %
Platelets: 224 10*3/uL (ref 150–400)
RBC: 4.37 MIL/uL (ref 3.87–5.11)
RDW: 12 % (ref 11.5–15.5)
WBC: 8.3 10*3/uL (ref 4.0–10.5)
nRBC: 0 % (ref 0.0–0.2)

## 2019-06-09 LAB — RAPID URINE DRUG SCREEN, HOSP PERFORMED
Amphetamines: NOT DETECTED
Barbiturates: NOT DETECTED
Benzodiazepines: POSITIVE — AB
Cocaine: NOT DETECTED
Opiates: NOT DETECTED
Tetrahydrocannabinol: POSITIVE — AB

## 2019-06-09 LAB — PREGNANCY, URINE: Preg Test, Ur: NEGATIVE

## 2019-06-09 LAB — COMPREHENSIVE METABOLIC PANEL
ALT: 27 U/L (ref 0–44)
AST: 17 U/L (ref 15–41)
Albumin: 3.9 g/dL (ref 3.5–5.0)
Alkaline Phosphatase: 82 U/L (ref 38–126)
Anion gap: 8 (ref 5–15)
BUN: 5 mg/dL — ABNORMAL LOW (ref 6–20)
CO2: 27 mmol/L (ref 22–32)
Calcium: 9.3 mg/dL (ref 8.9–10.3)
Chloride: 106 mmol/L (ref 98–111)
Creatinine, Ser: 1.05 mg/dL — ABNORMAL HIGH (ref 0.44–1.00)
GFR calc Af Amer: >60 mL/min (ref >60)
GFR calc non Af Amer: >60 mL/min (ref >60)
Glucose, Bld: 90 mg/dL (ref 70–99)
Potassium: 3.7 mmol/L (ref 3.5–5.1)
Sodium: 141 mmol/L (ref 135–145)
Total Bilirubin: 0.5 mg/dL (ref 0.3–1.2)
Total Protein: 6.9 g/dL (ref 6.5–8.1)

## 2019-06-09 LAB — URINALYSIS, ROUTINE W REFLEX MICROSCOPIC
Bilirubin Urine: NEGATIVE
Glucose, UA: NEGATIVE mg/dL
Hgb urine dipstick: NEGATIVE
Ketones, ur: NEGATIVE mg/dL
Leukocytes,Ua: NEGATIVE
Nitrite: NEGATIVE
Protein, ur: NEGATIVE mg/dL
Specific Gravity, Urine: 1.021 (ref 1.005–1.030)
pH: 6 (ref 5.0–8.0)

## 2019-06-09 MED ORDER — SODIUM CHLORIDE 0.9 % IV BOLUS
1000.0000 mL | Freq: Once | INTRAVENOUS | Status: AC
  Administered 2019-06-09: 16:00:00 1000 mL via INTRAVENOUS

## 2019-06-09 NOTE — ED Triage Notes (Signed)
Pt here from work with c/o dizziness and not feeling well, cbg 121

## 2019-06-09 NOTE — Discharge Instructions (Signed)
Recommend follow up with your PCP. Return to ER for new or worsening symptoms.
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
## 2019-10-02 ENCOUNTER — Other Ambulatory Visit: Payer: Self-pay

## 2019-10-02 ENCOUNTER — Ambulatory Visit (HOSPITAL_COMMUNITY)
Admission: EM | Admit: 2019-10-02 | Discharge: 2019-10-02 | Disposition: A | Discharge status: Home / Self Care | Payer: Medicaid Other | Attending: Emergency Medicine | Admitting: Emergency Medicine

## 2019-10-02 ENCOUNTER — Encounter (HOSPITAL_COMMUNITY): Payer: Self-pay

## 2019-10-02 DIAGNOSIS — J029 Acute pharyngitis, unspecified: Secondary | ICD-10-CM

## 2019-10-02 LAB — POCT RAPID STREP A: Streptococcus, Group A Screen (Direct): POSITIVE — AB

## 2019-10-02 MED ORDER — AMOXICILLIN 875 MG PO TABS: 875.0000 mg | ORAL_TABLET | Freq: Two times a day (BID) | ORAL | 0 refills | Status: DC

## 2019-10-02 MED ORDER — LIDOCAINE VISCOUS HCL 2 % MT SOLN: 15.0000 mL | OROMUCOSAL | 0 refills | Status: DC | PRN

## 2019-10-02 NOTE — ED Triage Notes (Signed)
Pt present sore throat with coughing and congestion. Symptoms started two days ago. Pt is also having difficulty swallowing
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
## 2019-10-07 ENCOUNTER — Encounter: Payer: Medicaid Other | Admitting: Certified Nurse Midwife

## 2019-10-08 ENCOUNTER — Encounter (HOSPITAL_COMMUNITY): Payer: Self-pay

## 2019-10-08 ENCOUNTER — Other Ambulatory Visit: Payer: Self-pay

## 2019-10-08 ENCOUNTER — Emergency Department (HOSPITAL_COMMUNITY)
Admission: EM | Admit: 2019-10-08 | Discharge: 2019-10-08 | Disposition: A | Discharge status: Home / Self Care | Payer: Medicaid Other | Attending: Emergency Medicine | Admitting: Emergency Medicine

## 2019-10-08 DIAGNOSIS — L02416 Cutaneous abscess of left lower limb: Secondary | ICD-10-CM | POA: Diagnosis not present

## 2019-10-08 DIAGNOSIS — J45909 Unspecified asthma, uncomplicated: Secondary | ICD-10-CM | POA: Insufficient documentation

## 2019-10-08 DIAGNOSIS — B9689 Other specified bacterial agents as the cause of diseases classified elsewhere: Secondary | ICD-10-CM | POA: Insufficient documentation

## 2019-10-08 DIAGNOSIS — F17210 Nicotine dependence, cigarettes, uncomplicated: Secondary | ICD-10-CM | POA: Insufficient documentation

## 2019-10-08 DIAGNOSIS — Z79899 Other long term (current) drug therapy: Secondary | ICD-10-CM | POA: Diagnosis not present

## 2019-10-08 DIAGNOSIS — L089 Local infection of the skin and subcutaneous tissue, unspecified: Secondary | ICD-10-CM | POA: Diagnosis present

## 2019-10-08 MED ORDER — SULFAMETHOXAZOLE-TRIMETHOPRIM 800-160 MG PO TABS: 1.0000 | ORAL_TABLET | Freq: Two times a day (BID) | ORAL | 0 refills | Status: DC

## 2019-10-08 MED ORDER — KETOROLAC TROMETHAMINE 30 MG/ML IJ SOLN
30.0000 mg | Freq: Once | INTRAMUSCULAR | Status: AC
  Administered 2019-10-08: 30 mg via INTRAMUSCULAR
  Filled 2019-10-08: qty 1

## 2019-10-08 MED ORDER — BUPIVACAINE HCL (PF) 0.5 % IJ SOLN
10.0000 mL | Freq: Once | INTRAMUSCULAR | Status: AC
  Administered 2019-10-08: 10 mL
  Filled 2019-10-08: qty 30
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
## 2019-10-08 NOTE — ED Triage Notes (Signed)
Painful boil to the inside of her left upper thigh x 3 days

## 2019-10-08 NOTE — Discharge Instructions (Addendum)
Soak in a tub of warm water for 20 to 30 minutes 3 times a day.  You can take ibuprofen 600 mg plus acetaminophen 1000 mg 4 times a day for pain if needed.  In 2 days, while you are in the tub pull the packing out of the wound.  Recheck if you get worse such as increasing redness, swelling, pain or fever.

## 2020-01-07 ENCOUNTER — Emergency Department (HOSPITAL_COMMUNITY)
Admission: EM | Admit: 2020-01-07 | Discharge: 2020-01-07 | Disposition: A | Discharge status: Home / Self Care | Payer: Medicaid Other | Attending: Emergency Medicine | Admitting: Emergency Medicine

## 2020-01-07 ENCOUNTER — Emergency Department (HOSPITAL_COMMUNITY): Payer: Medicaid Other

## 2020-01-07 DIAGNOSIS — A64 Unspecified sexually transmitted disease: Secondary | ICD-10-CM | POA: Diagnosis not present

## 2020-01-07 DIAGNOSIS — R6883 Chills (without fever): Secondary | ICD-10-CM | POA: Insufficient documentation

## 2020-01-07 DIAGNOSIS — N760 Acute vaginitis: Secondary | ICD-10-CM | POA: Insufficient documentation

## 2020-01-07 DIAGNOSIS — F17210 Nicotine dependence, cigarettes, uncomplicated: Secondary | ICD-10-CM | POA: Insufficient documentation

## 2020-01-07 DIAGNOSIS — Z79899 Other long term (current) drug therapy: Secondary | ICD-10-CM | POA: Insufficient documentation

## 2020-01-07 DIAGNOSIS — J45909 Unspecified asthma, uncomplicated: Secondary | ICD-10-CM | POA: Insufficient documentation

## 2020-01-07 DIAGNOSIS — R519 Headache, unspecified: Secondary | ICD-10-CM | POA: Diagnosis present

## 2020-01-07 DIAGNOSIS — F909 Attention-deficit hyperactivity disorder, unspecified type: Secondary | ICD-10-CM | POA: Insufficient documentation

## 2020-01-07 DIAGNOSIS — Z20822 Contact with and (suspected) exposure to covid-19: Secondary | ICD-10-CM | POA: Diagnosis not present

## 2020-01-07 DIAGNOSIS — B9689 Other specified bacterial agents as the cause of diseases classified elsewhere: Secondary | ICD-10-CM

## 2020-01-07 LAB — COMPREHENSIVE METABOLIC PANEL
ALT: 45 U/L — ABNORMAL HIGH (ref 0–44)
AST: 26 U/L (ref 15–41)
Albumin: 4.2 g/dL (ref 3.5–5.0)
Alkaline Phosphatase: 89 U/L (ref 38–126)
Anion gap: 9 (ref 5–15)
BUN: 8 mg/dL (ref 6–20)
CO2: 26 mmol/L (ref 22–32)
Calcium: 9.8 mg/dL (ref 8.9–10.3)
Chloride: 103 mmol/L (ref 98–111)
Creatinine, Ser: 0.84 mg/dL (ref 0.44–1.00)
GFR calc Af Amer: >60 mL/min (ref >60)
GFR calc non Af Amer: >60 mL/min (ref >60)
Glucose, Bld: 81 mg/dL (ref 70–99)
Potassium: 4.5 mmol/L (ref 3.5–5.1)
Sodium: 138 mmol/L (ref 135–145)
Total Bilirubin: 0.7 mg/dL (ref 0.3–1.2)
Total Protein: 7.5 g/dL (ref 6.5–8.1)

## 2020-01-07 LAB — URINALYSIS, ROUTINE W REFLEX MICROSCOPIC
Bacteria, UA: NONE SEEN
Bilirubin Urine: NEGATIVE
Glucose, UA: NEGATIVE mg/dL
Hgb urine dipstick: NEGATIVE
Ketones, ur: NEGATIVE mg/dL
Nitrite: NEGATIVE
Protein, ur: NEGATIVE mg/dL
Specific Gravity, Urine: 1.023 (ref 1.005–1.030)
pH: 5 (ref 5.0–8.0)

## 2020-01-07 LAB — CBC WITH DIFFERENTIAL/PLATELET
Abs Immature Granulocytes: 0.01 10*3/uL (ref 0.00–0.07)
Basophils Absolute: 0.1 10*3/uL (ref 0.0–0.1)
Basophils Relative: 1 %
Eosinophils Absolute: 0.3 10*3/uL (ref 0.0–0.5)
Eosinophils Relative: 4 %
HCT: 44.9 % (ref 36.0–46.0)
Hemoglobin: 14.9 g/dL (ref 12.0–15.0)
Immature Granulocytes: 0 %
Lymphocytes Relative: 34 %
Lymphs Abs: 3.1 10*3/uL (ref 0.7–4.0)
MCH: 31.2 pg (ref 26.0–34.0)
MCHC: 33.2 g/dL (ref 30.0–36.0)
MCV: 93.9 fL (ref 80.0–100.0)
Monocytes Absolute: 0.7 10*3/uL (ref 0.1–1.0)
Monocytes Relative: 8 %
Neutro Abs: 4.9 10*3/uL (ref 1.7–7.7)
Neutrophils Relative %: 53 %
Platelets: 239 10*3/uL (ref 150–400)
RBC: 4.78 MIL/uL (ref 3.87–5.11)
RDW: 12 % (ref 11.5–15.5)
WBC: 9.1 10*3/uL (ref 4.0–10.5)
nRBC: 0 % (ref 0.0–0.2)

## 2020-01-07 LAB — LIPASE, BLOOD: Lipase: 41 U/L (ref 11–51)

## 2020-01-07 LAB — RESPIRATORY PANEL BY RT PCR (FLU A&B, COVID)
Influenza A by PCR: NEGATIVE
Influenza B by PCR: NEGATIVE
SARS Coronavirus 2 by RT PCR: NEGATIVE

## 2020-01-07 LAB — I-STAT BETA HCG BLOOD, ED (MC, WL, AP ONLY): I-stat hCG, quantitative: <5 m[IU]/mL (ref <5)

## 2020-01-07 LAB — WET PREP, GENITAL
Sperm: NONE SEEN
Trich, Wet Prep: NONE SEEN
Yeast Wet Prep HPF POC: NONE SEEN

## 2020-01-07 MED ORDER — METRONIDAZOLE 500 MG PO TABS: 500.0000 mg | ORAL_TABLET | Freq: Two times a day (BID) | ORAL | 0 refills | Status: AC

## 2020-01-07 MED ORDER — METRONIDAZOLE 500 MG PO TABS
2000.0000 mg | ORAL_TABLET | Freq: Once | ORAL | Status: AC
  Administered 2020-01-07: 2000 mg via ORAL
  Filled 2020-01-07: qty 4
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
## 2020-01-07 NOTE — ED Triage Notes (Signed)
Pt. Stated, I tested Positive for COVID 2-3 days ago. Im having stomach pain, headache, and having chills. My husband has COVID

## 2020-01-07 NOTE — ED Notes (Addendum)
The pa has asked that the pt collect her own wet prep and gc specimens  Sent to lab

## 2020-01-07 NOTE — Discharge Instructions (Addendum)
Today your Covid test was negative, however given your exposure, you not being vaccinated, and your chest x-ray looking concerning for Covid I highly suspect that this is a false negative and that you do have Covid.  Please quarantine yourself at home.  Please take Tylenol (acetaminophen) to relieve your pain.  You may take tylenol, up to 1,000 mg (two extra strength pills).  Do not take more than 3,000 mg tylenol in a 24 hour period.  Please check all medication labels as many medications such as pain and cold medications may contain tylenol. Please do not drink alcohol while taking this medication.   You may have diarrhea from the antibiotics.  It is very important that you continue to take the antibiotics even if you get diarrhea unless a medical professional tells you that you may stop taking them.  If you stop too early the bacteria you are being treated for will become stronger and you may need different, more powerful antibiotics that have more side effects and worsening diarrhea.  Please stay well hydrated and consider probiotics as they may decrease the severity of your diarrhea.  Please be aware that if you take any hormonal contraception (birth control pills, nexplanon, the ring, etc) that your birth control will not work while you are taking antibiotics and you need to use back up protection as directed on the birth control medication information insert.   Today your diagnosed with bacterial vaginosis and received a prescription for metronidazole also known as Flagyl. It is very important that you do not consume any alcohol while taking this medication as it will cause you to become violently ill.

## 2020-01-07 NOTE — ED Triage Notes (Signed)
Pt. Has a bad shoulder pain started yesterday morning.

## 2020-01-07 NOTE — ED Notes (Signed)
The pt is c/o lt shoulder pain  Her husband has covid   She tested positive 3-4 days ago [facility] [place].  She has not had a temp she reports sore throat  Cold cough no difficulty breathing  She came in to be tested again

## 2020-01-09 ENCOUNTER — Other Ambulatory Visit: Payer: Self-pay | Admitting: Physician Assistant

## 2020-01-09 ENCOUNTER — Telehealth: Payer: Self-pay | Admitting: Physician Assistant

## 2020-01-09 LAB — GC/CHLAMYDIA PROBE AMP (~~LOC~~) NOT AT ARMC
Chlamydia: NEGATIVE
Comment: NEGATIVE
Comment: NORMAL
Neisseria Gonorrhea: NEGATIVE
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
## 2020-02-17 NOTE — Progress Notes (Signed)
Pt present for IUD removal. Pt stated that she would like to become pregnant and do not want any form of birth control at this time.

## 2020-02-20 ENCOUNTER — Ambulatory Visit (INDEPENDENT_AMBULATORY_CARE_PROVIDER_SITE_OTHER): Payer: Medicaid Other | Admitting: Certified Nurse Midwife

## 2020-02-20 ENCOUNTER — Other Ambulatory Visit: Payer: Self-pay

## 2020-02-20 ENCOUNTER — Encounter: Payer: Self-pay | Admitting: Certified Nurse Midwife

## 2020-02-20 VITALS — BP 112/86 | HR 76 | Ht 60.0 in | Wt 176.6 lb

## 2020-02-20 DIAGNOSIS — Z30432 Encounter for removal of intrauterine contraceptive device: Secondary | ICD-10-CM

## 2020-02-20 DIAGNOSIS — T8332XA Displacement of intrauterine contraceptive device, initial encounter: Secondary | ICD-10-CM

## 2020-02-20 NOTE — Progress Notes (Signed)
Christina Warner is a 24 y.o. year old G76P2002 female who presents for removal of a Mirena IUD. Her Mirena IUD was placed 08/20/2018. Desires pregnancy.   BP 112/86   Pulse 76   Ht 5' (1.524 m)   Wt 176 lb 9.6 oz (80.1 kg)   LMP 02/06/2020   BMI 34.49 kg/m   Time out was performed.  A small speculum was placed in the vagina.  The cervix was visualized, and the strings were NOT visible. Attempts to visualize strings with use of IUD hook and tonsil clamp were unsuccessful. Patient requested to stop further attempts.   F/U Thursday for ultrasound guided IUD removal or sooner if needed.    Serafina Royals, CNM [facility], [facility], [facility], [facility] 02/20/20 9:04 AM

## 2020-02-20 NOTE — Patient Instructions (Addendum)
Preventive Care 57-22 Years Old, Female Preventive care refers to visits with your health care provider and lifestyle choices that can promote health and wellness. This includes:  A yearly physical exam. This may also be called an annual well check.  Regular dental visits and eye exams.  Immunizations.  Screening for certain conditions.  Healthy lifestyle choices, such as eating a healthy diet, getting regular exercise, not using drugs or products that contain nicotine and tobacco, and limiting alcohol use. What can I expect for my preventive care visit? Physical exam Your health care provider will check your:  Height and weight. This may be used to calculate body mass index (BMI), which tells if you are at a healthy weight.  Heart rate and blood pressure.  Skin for abnormal spots. Counseling Your health care provider may ask you questions about your:  Alcohol, tobacco, and drug use.  Emotional well-being.  Home and relationship well-being.  Sexual activity.  Eating habits.  Work and work Statistician.  Method of birth control.  Menstrual cycle.  Pregnancy history. What immunizations do I need?  Influenza (flu) vaccine  This is recommended every year. Tetanus, diphtheria, and pertussis (Tdap) vaccine  You may need a Td booster every 10 years. Varicella (chickenpox) vaccine  You may need this if you have not been vaccinated. Human papillomavirus (HPV) vaccine  If recommended by your health care provider, you may need three doses over 6 months. Measles, mumps, and rubella (MMR) vaccine  You may need at least one dose of MMR. You may also need a second dose. Meningococcal conjugate (MenACWY) vaccine  One dose is recommended if you are age 31-21 years and a first-year college student living in a residence hall, or if you have one of several medical conditions. You may also need additional booster doses. Pneumococcal conjugate (PCV13) vaccine  You may need  this if you have certain conditions and were not previously vaccinated. Pneumococcal polysaccharide (PPSV23) vaccine  You may need one or two doses if you smoke cigarettes or if you have certain conditions. Hepatitis A vaccine  You may need this if you have certain conditions or if you travel or work in places where you may be exposed to hepatitis A. Hepatitis B vaccine  You may need this if you have certain conditions or if you travel or work in places where you may be exposed to hepatitis B. Haemophilus influenzae type b (Hib) vaccine  You may need this if you have certain conditions. You may receive vaccines as individual doses or as more than one vaccine together in one shot (combination vaccines). Talk with your health care provider about the risks and benefits of combination vaccines. What tests do I need?  Blood tests  Lipid and cholesterol levels. These may be checked every 5 years starting at age 22.  Hepatitis C test.  Hepatitis B test. Screening  Diabetes screening. This is done by checking your blood sugar (glucose) after you have not eaten for a while (fasting).  Sexually transmitted disease (STD) testing.  BRCA-related cancer screening. This may be done if you have a family history of breast, ovarian, tubal, or peritoneal cancers.  Pelvic exam and Pap test. This may be done every 3 years starting at age 22. Starting at age 22, this may be done every 5 years if you have a Pap test in combination with an HPV test. Talk with your health care provider about your test results, treatment options, and if necessary, the need for more tests.  Follow these instructions at home: Eating and drinking   Eat a diet that includes fresh fruits and vegetables, whole grains, lean protein, and low-fat dairy.  Take vitamin and mineral supplements as recommended by your health care provider.  Do not drink alcohol if: ? Your health care provider tells you not to drink. ? You are  pregnant, may be pregnant, or are planning to become pregnant.  If you drink alcohol: ? Limit how much you have to 0-1 drink a day. ? Be aware of how much alcohol is in your drink. In the [place] one drink equals one 12 oz bottle of beer (355 mL), one 5 oz glass of wine (148 mL), or one 1 oz glass of hard liquor (44 mL). Lifestyle  Take daily care of your teeth and gums.  Stay active. Exercise for at least 30 minutes on 5 or more days each week.  Do not use any products that contain nicotine or tobacco, such as cigarettes, e-cigarettes, and chewing tobacco. If you need help quitting, ask your health care provider.  If you are sexually active, practice safe sex. Use a condom or other form of birth control (contraception) in order to prevent pregnancy and STIs (sexually transmitted infections). If you plan to become pregnant, see your health care provider for a preconception visit. What's next?  Visit your health care provider once a year for a well check visit.  Ask your health care provider how often you should have your eyes and teeth checked.  Stay up to date on all vaccines. This information is not intended to replace advice given to you by your health care provider. Make sure you discuss any questions you have with your health care provider. Document Revised: 12/10/2017 Document Reviewed: 12/10/2017 Elsevier Patient Education  2020 [employer].

## 2020-02-23 ENCOUNTER — Ambulatory Visit (INDEPENDENT_AMBULATORY_CARE_PROVIDER_SITE_OTHER): Payer: Medicaid Other | Admitting: Certified Nurse Midwife

## 2020-02-23 ENCOUNTER — Encounter: Payer: Self-pay | Admitting: Certified Nurse Midwife

## 2020-02-23 ENCOUNTER — Ambulatory Visit (INDEPENDENT_AMBULATORY_CARE_PROVIDER_SITE_OTHER): Payer: Medicaid Other

## 2020-02-23 ENCOUNTER — Other Ambulatory Visit: Payer: Self-pay

## 2020-02-23 VITALS — BP 120/88 | Ht 60.0 in | Wt 173.1 lb

## 2020-02-23 DIAGNOSIS — T8332XA Displacement of intrauterine contraceptive device, initial encounter: Secondary | ICD-10-CM

## 2020-02-23 DIAGNOSIS — T8332XD Displacement of intrauterine contraceptive device, subsequent encounter: Secondary | ICD-10-CM | POA: Diagnosis not present

## 2020-02-23 DIAGNOSIS — Z30432 Encounter for removal of intrauterine contraceptive device: Secondary | ICD-10-CM

## 2020-02-23 MED ORDER — ESCITALOPRAM OXALATE 10 MG PO TABS: 10.0000 mg | ORAL_TABLET | Freq: Every day | ORAL | 5 refills | Status: DC

## 2020-02-23 NOTE — Progress Notes (Signed)
Christina Warner is a 24 y.o. year old G3P2002 female who presents for removal of a Mirena IUD. Her Mirena IUD was placed 08/20/2018.   BP 120/88   Ht 5' (1.524 m)   Wt 173 lb 1.6 oz (78.5 kg)   LMP 02/06/2020   BMI 33.81 kg/m   Time out was performed. IUD proper IUD placement was visualized using abdominal ultrasound.   ULTRASOUND REPORT  Location: [facility]  Date of Service: 02/23/2020   Indications:IUD removal  Findings:  The uterus is anteverted  Echo texture is homogenous without evidence of focal masses.  The Endometrium measures 3 mm.   Impression: 1. IUD was successfully removed.  Recommendations: 1.Clinical correlation with the patient's History and Physical Exam.  A medium plastic speculum was placed in the vagina.  The cervix was visualized, and the strings were NOT visible. After two attempts with the IUD hook, strings were visible. They were grasped and the Mirena was easily removed intact without complications.   Rx Lexapro, see orders.   Prenatal vitamin samples provided.   Reviewed red flag symptoms and when to call.   RTC for ANNUAL EXAM or sooner if needed.

## 2020-02-23 NOTE — Patient Instructions (Addendum)
Preparing for Pregnancy If you are considering becoming pregnant, make an appointment to see your regular health care provider to learn how to prepare for a safe and healthy pregnancy (preconception care). During a preconception care visit, your health care provider will:  Do a complete physical exam, including a Pap test.  Take a complete medical history.  Give you information, answer your questions, and help you resolve problems. Preconception checklist Medical history  Tell your health care provider about any current or past medical conditions. Your pregnancy or your ability to become pregnant may be affected by chronic conditions, such as diabetes, chronic hypertension, and thyroid problems.  Include your family's medical history as well as your partner's medical history.  Tell your health care provider about any history of STIs (sexually transmitted infections).These can affect your pregnancy. In some cases, they can be passed to your baby. Discuss any concerns that you have about STIs.  If indicated, discuss the benefits of genetic testing. This testing will show whether there are any genetic conditions that may be passed from you or your partner to your baby.  Tell your health care provider about: ? Any problems you have had with conception or pregnancy. ? Any medicines you take. These include vitamins, herbal supplements, and over-the-counter medicines. ? Your history of immunizations. Discuss any vaccinations that you may need. Diet  Ask your health care provider what to include in a healthy diet that has a balance of nutrients. This is especially important when you are pregnant or preparing to become pregnant.  Ask your health care provider to help you reach a healthy weight before pregnancy. ? If you are overweight, you may be at higher risk for certain complications, such as high blood pressure, diabetes, and preterm birth. ? If you are underweight, you are more likely to  have a baby who has a low birth weight. Lifestyle, work, and home  Let your health care provider know: ? About any lifestyle habits that you have, such as alcohol use, drug use, or smoking. ? About recreational activities that may put you at risk during pregnancy, such as downhill skiing and certain exercise programs. ? Tell your health care provider about any international travel, especially any travel to places with an active Zika virus outbreak. ? About harmful substances that you may be exposed to at work or at home. These include chemicals, pesticides, radiation, or even litter boxes. ? If you do not feel safe at home. Mental health  Tell your health care provider about: ? Any history of mental health conditions, including feelings of depression, sadness, or anxiety. ? Any medicines that you take for a mental health condition. These include herbs and supplements. Home instructions to prepare for pregnancy Lifestyle   Eat a balanced diet. This includes fresh fruits and vegetables, whole grains, lean meats, low-fat dairy products, healthy fats, and foods that are high in fiber. Ask to meet with a nutritionist or registered dietitian for assistance with meal planning and goals.  Get regular exercise. Try to be active for at least 30 minutes a day on most days of the week. Ask your health care provider which activities are safe during pregnancy.  Do not use any products that contain nicotine or tobacco, such as cigarettes and e-cigarettes. If you need help quitting, ask your health care provider.  Do not drink alcohol.  Do not take illegal drugs.  Maintain a healthy weight. Ask your health care provider what weight range is right for you. General   instructions  Keep an accurate record of your menstrual periods. This makes it easier for your health care provider to determine your baby's due date.  Begin taking prenatal vitamins and folic acid supplements daily as directed by your  health care provider.  Manage any chronic conditions, such as high blood pressure and diabetes, as told by your health care provider. This is important. How do I know that I am pregnant? You may be pregnant if you have been sexually active and you miss your period. Symptoms of early pregnancy include:  Mild cramping.  Very light vaginal bleeding (spotting).  Feeling unusually tired.  Nausea and vomiting (morning sickness). If you have any of these symptoms and you suspect that you might be pregnant, you can take a home pregnancy test. These tests check for a hormone in your urine (human chorionic gonadotropin, or hCG). A woman's body begins to make this hormone during early pregnancy. These tests are very accurate. Wait until at least the first day after you miss your period to take one. If the test shows that you are pregnant (you get a positive result), call your health care provider to make an appointment for prenatal care. What should I do if I become pregnant?      Make an appointment with your health care provider as soon as you suspect you are pregnant.  Do not use any products that contain nicotine, such as cigarettes, chewing tobacco, and e-cigarettes. If you need help quitting, ask your health care provider.  Do not drink alcoholic beverages. Alcohol is related to a number of birth defects.  Avoid toxic odors and chemicals.  You may continue to have sexual intercourse if it does not cause pain or other problems, such as vaginal bleeding. This information is not intended to replace advice given to you by your health care provider. Make sure you discuss any questions you have with your health care provider. Document Revised: 04/02/2017 Document Reviewed: 10/21/2015 Elsevier Patient Education  2020 Else[employer] Escitalopram tablets What is this medicine? ESCITALOPRAM (es sye TAL oh pram) is used to treat depression and certain types of anxiety. This medicine may be used for  other purposes; ask your health care provider or pharmacist if you have questions. COMMON BRAND NAME(S): Lexapro What should I tell my health care provider before I take this medicine? They need to know if you have any of these conditions:  bipolar disorder or a family history of bipolar disorder  diabetes  glaucoma  heart disease  kidney or liver disease  receiving electroconvulsive therapy  seizures (convulsions)  suicidal thoughts, plans, or attempt by you or a family member  an unusual or allergic reaction to escitalopram, the related drug citalopram, other medicines, foods, dyes, or preservatives  pregnant or trying to become pregnant  breast-feeding How should I use this medicine? Take this medicine by mouth with a glass of water. Follow the directions on the prescription label. You can take it with or without food. If it upsets your stomach, take it with food. Take your medicine at regular intervals. Do not take it more often than directed. Do not stop taking this medicine suddenly except upon the advice of your doctor. Stopping this medicine too quickly may cause serious side effects or your condition may worsen. A special MedGuide will be given to you by the pharmacist with each prescription and refill. Be sure to read this information carefully each time. Talk to your pediatrician regarding the use of this medicine in  children. Special care may be needed. Overdosage: If you think you have taken too much of this medicine contact a poison control center or emergency room at once. NOTE: This medicine is only for you. Do not share this medicine with others. What if I miss a dose? If you miss a dose, take it as soon as you can. If it is almost time for your next dose, take only that dose. Do not take double or extra doses. What may interact with this medicine? Do not take this medicine with any of the following medications:  certain medicines for fungal infections like  fluconazole, itraconazole, ketoconazole, posaconazole, voriconazole  cisapride  citalopram  dronedarone  linezolid  MAOIs like Carbex, Eldepryl, Marplan, Nardil, and Parnate  methylene blue (injected into a vein)  pimozide  thioridazine This medicine may also interact with the following medications:  alcohol  amphetamines  aspirin and aspirin-like medicines  carbamazepine  certain medicines for depression, anxiety, or psychotic disturbances  certain medicines for migraine headache like almotriptan, eletriptan, frovatriptan, naratriptan, rizatriptan, sumatriptan, zolmitriptan  certain medicines for sleep  certain medicines that treat or prevent blood clots like warfarin, enoxaparin, dalteparin  cimetidine  diuretics  dofetilide  fentanyl  furazolidone  isoniazid  lithium  metoprolol  NSAIDs, medicines for pain and inflammation, like ibuprofen or naproxen  other medicines that prolong the QT interval (cause an abnormal heart rhythm)  procarbazine  rasagiline  supplements like St. John's wort, kava kava, valerian  tramadol  tryptophan  ziprasidone This list may not describe all possible interactions. Give your health care provider a list of all the medicines, herbs, non-prescription drugs, or dietary supplements you use. Also tell them if you smoke, drink alcohol, or use illegal drugs. Some items may interact with your medicine. What should I watch for while using this medicine? Tell your doctor if your symptoms do not get better or if they get worse. Visit your doctor or health care professional for regular checks on your progress. Because it may take several weeks to see the full effects of this medicine, it is important to continue your treatment as prescribed by your doctor. Patients and their families should watch out for new or worsening thoughts of suicide or depression. Also watch out for sudden changes in feelings such as feeling anxious,  agitated, panicky, irritable, hostile, aggressive, impulsive, severely restless, overly excited and hyperactive, or not being able to sleep. If this happens, especially at the beginning of treatment or after a change in dose, call your health care professional. Bonita Quin may get drowsy or dizzy. Do not drive, use machinery, or do anything that needs mental alertness until you know how this medicine affects you. Do not stand or sit up quickly, especially if you are an older patient. This reduces the risk of dizzy or fainting spells. Alcohol may interfere with the effect of this medicine. Avoid alcoholic drinks. Your mouth may get dry. Chewing sugarless gum or sucking hard candy, and drinking plenty of water may help. Contact your doctor if the problem does not go away or is severe. What side effects may I notice from receiving this medicine? Side effects that you should report to your doctor or health care professional as soon as possible:  allergic reactions like skin rash, itching or hives, swelling of the face, lips, or tongue  anxious  black, tarry stools  changes in vision  confusion  elevated mood, decreased need for sleep, racing thoughts, impulsive behavior  eye pain  fast, irregular heartbeat  feeling faint or lightheaded, falls  feeling agitated, angry, or irritable  hallucination, loss of contact with reality  loss of balance or coordination  loss of memory  painful or prolonged erections  restlessness, pacing, inability to keep still  seizures  stiff muscles  suicidal thoughts or other mood changes  trouble sleeping  unusual bleeding or bruising  unusually weak or tired  vomiting Side effects that usually do not require medical attention (report to your doctor or health care professional if they continue or are bothersome):  changes in appetite  change in sex drive or performance  headache  increased sweating  indigestion, nausea  tremors This list  may not describe all possible side effects. Call your doctor for medical advice about side effects. You may report side effects to FDA at 1-800-FDA-1088. Where should I keep my medicine? Keep out of reach of children. Store at room temperature between 15 and 30 degrees C (59 and 86 degrees F). Throw away any unused medicine after the expiration date. NOTE: This sheet is a summary. It may not cover all possible information. If you have questions about this medicine, talk to your doctor, pharmacist, or health care provider.  2020 Elsevier/Gold Standard (2018-03-22 11:21:44)

## 2020-02-27 ENCOUNTER — Telehealth: Payer: Self-pay

## 2020-02-27 NOTE — Telephone Encounter (Signed)
mychart message sent

## 2020-02-27 NOTE — Telephone Encounter (Signed)
Pt called in and stated that she was seen last week to have her IUD removed the pt is experiencing  bad cramps and passing blood clots the pt is wanting to know if that is normal. I told the pt I will send a message and that a nurse will be in contact with her on mychart. Please advise

## 2020-03-14 ENCOUNTER — Telehealth (HOSPITAL_COMMUNITY): Payer: Self-pay | Admitting: Emergency Medicine

## 2020-03-14 ENCOUNTER — Ambulatory Visit (HOSPITAL_COMMUNITY)
Admission: EM | Admit: 2020-03-14 | Discharge: 2020-03-14 | Disposition: A | Discharge status: Home / Self Care | Payer: Medicaid Other | Attending: Family Medicine | Admitting: Family Medicine

## 2020-03-14 ENCOUNTER — Encounter (HOSPITAL_COMMUNITY): Payer: Self-pay | Admitting: Emergency Medicine

## 2020-03-14 ENCOUNTER — Other Ambulatory Visit: Payer: Self-pay

## 2020-03-14 DIAGNOSIS — J069 Acute upper respiratory infection, unspecified: Secondary | ICD-10-CM | POA: Insufficient documentation

## 2020-03-14 DIAGNOSIS — Z3202 Encounter for pregnancy test, result negative: Secondary | ICD-10-CM | POA: Diagnosis present

## 2020-03-14 DIAGNOSIS — Z20822 Contact with and (suspected) exposure to covid-19: Secondary | ICD-10-CM | POA: Insufficient documentation

## 2020-03-14 LAB — POCT URINALYSIS DIPSTICK, ED / UC
Bilirubin Urine: NEGATIVE
Glucose, UA: NEGATIVE mg/dL
Hgb urine dipstick: NEGATIVE
Ketones, ur: NEGATIVE mg/dL
Leukocytes,Ua: NEGATIVE
Nitrite: NEGATIVE
Protein, ur: NEGATIVE mg/dL
Specific Gravity, Urine: 1.015 (ref 1.005–1.030)
Urobilinogen, UA: 1 mg/dL (ref 0.0–1.0)
pH: 7 (ref 5.0–8.0)

## 2020-03-14 LAB — HCG, QUANTITATIVE, PREGNANCY: hCG, Beta Chain, Quant, S: 1 m[IU]/mL (ref <5)

## 2020-03-14 LAB — SARS CORONAVIRUS 2 (TAT 6-24 HRS): SARS Coronavirus 2: NEGATIVE

## 2020-03-14 LAB — POC URINE PREG, ED: Preg Test, Ur: NEGATIVE

## 2020-03-14 MED ORDER — HYDROCODONE-HOMATROPINE 5-1.5 MG/5ML PO SYRP: 5.0000 mL | ORAL_SOLUTION | Freq: Four times a day (QID) | ORAL | 0 refills | Status: DC | PRN

## 2020-03-14 NOTE — ED Triage Notes (Signed)
Pt presents with sore throat, productive cough, and headache xs 1 weeks.  Also c/o of lower abdominal pain and urinary frequency xs 3 days. States had IUD removed on 03/24/2020. States has passed multiple clots since. States has recently taken 2 at home pregnancy test with 1 positive result and one negative.

## 2020-03-14 NOTE — Discharge Instructions (Addendum)
You have been tested for COVID-19 today. If your test returns positive, you will receive a phone call from [facility] regarding your results. Negative test results are not called. Both positive and negative results area always visible on MyChart. If you do not have a MyChart account, sign up instructions are provided in your discharge papers. Please do not hesitate to contact us should you have questions or concerns.   Wait to take the prescribed cough medication until we receive your blood pregnancy test.

## 2020-03-14 NOTE — ED Provider Notes (Signed)
[facility]   [id number] 03/14/20 Arrival Time: 1049  ASSESSMENT & PLAN:  1. Viral URI with cough   2. Pregnancy examination or test, negative result      COVID-19 testing sent. See letter/work note on file for self-isolation guidelines. OTC symptom care as needed.  UPT negative. Pending: Labs Reviewed  HCG, QUANTITATIVE, PREGNANCY     Discharge Instructions     You have been tested for COVID-19 today. If your test returns positive, you will receive a phone call from [facility] regarding your results. Negative test results are not called. Both positive and negative results area always visible on MyChart. If you do not have a MyChart account, sign up instructions are provided in your discharge papers. Please do not hesitate to contact us should you have questions or concerns.   Wait to take the prescribed cough medication until we receive your blood pregnancy test.     Meds ordered this encounter  Medications  . HYDROcodone-homatropine (HYCODAN) 5-1.5 MG/5ML syrup    Sig: Take 5 mLs by mouth every 6 (six) hours as needed for cough.    Dispense:  90 mL    Refill:  0     Follow-up Information    [facility].   Specialty: Urgent Care Why: As needed. Contact information: [address] [place] [address] [phone]              Reviewed expectations re: course of current medical issues. Questions answered. Outlined signs and symptoms indicating need for more acute intervention. Understanding verbalized. After Visit Summary given.   SUBJECTIVE: History from: patient. Christina Warner is a 24 y.o. female who reports URI symptoms for one week. Coughing bothering her the most; affecting sleep. Denies: fever and difficulty breathing. Normal PO intake without n/v/d.  Also requests pregnancy test. Patient's last menstrual period was 02/06/2020. Reports both a + and - test at home. No abd  pain.  OBJECTIVE:  Vitals:   03/14/20 1235  BP: 122/73  Pulse: 70  Resp: 17  Temp: 98.5 F (36.9 C)  TempSrc: Oral  SpO2: 100%    General appearance: alert; no distress Eyes: PERRLA; EOMI; conjunctiva normal HENT: ; AT; with mild nasal congestion Neck: supple  Lungs: speaks full sentences without difficulty; unlabored; CTAB Extremities: no edema Skin: warm and dry Neurologic: normal gait Psychological: alert and cooperative; normal mood and affect  Labs: Results for orders placed or performed during the hospital encounter of 03/14/20  POCT Urinalysis Dipstick (ED/UC)  Result Value Ref Range   Glucose, UA NEGATIVE NEGATIVE mg/dL   Bilirubin Urine NEGATIVE NEGATIVE   Ketones, ur NEGATIVE NEGATIVE mg/dL   Specific Gravity, Urine 1.015 1.005 - 1.030   Hgb urine dipstick NEGATIVE NEGATIVE   pH 7.0 5.0 - 8.0   Protein, ur NEGATIVE NEGATIVE mg/dL   Urobilinogen, UA 1.0 0.0 - 1.0 mg/dL   Nitrite NEGATIVE NEGATIVE   Leukocytes,Ua NEGATIVE NEGATIVE  POC urine preg, ED (not at [facility])  Result Value Ref Range   Preg Test, Ur NEGATIVE NEGATIVE   Labs Reviewed  SARS CORONAVIRUS 2 (TAT 6-24 HRS)  HCG, QUANTITATIVE, PREGNANCY  POCT URINALYSIS DIPSTICK, ED / UC  POC URINE PREG, ED     No Known Allergies  Past Medical History:  Diagnosis Date  . ADHD (attention deficit hyperactivity disorder)   . Asthma   . Depression    Social History   Socioeconomic History  . Marital status: Married  Spouse name: Bertram Gala  . Number of children: 1  . Years of education: Not on file  . Highest education level: Not on file  Occupational History  . Not on file  Tobacco Use  . Smoking status: Current Every Day Smoker    Packs/day: 1.00    Types: Cigarettes    Start date: 04/15/2011  . Smokeless tobacco: Never Used  . Tobacco comment: Quit while pregnant  Vaping Use  . Vaping Use: Never used  Substance and Sexual Activity  . Alcohol use: Yes    Alcohol/week: 5.0  standard drinks    Types: 5 Shots of liquor per week  . Drug use: Not Currently    Frequency: 1.0 times per week    Types: Marijuana  . Sexual activity: Yes    Birth control/protection: None  Other Topics Concern  . Not on file  Social History Narrative  . Not on file   Social Determinants of Health   Financial Resource Strain:   . Difficulty of Paying Living Expenses: Not on file  Food Insecurity:   . Worried About Programme researcher, broadcasting/film/video in the Last Year: Not on file  . Ran Out of Food in the Last Year: Not on file  Transportation Needs:   . Lack of Transportation (Medical): Not on file  . Lack of Transportation (Non-Medical): Not on file  Physical Activity:   . Days of Exercise per Week: Not on file  . Minutes of Exercise per Session: Not on file  Stress:   . Feeling of Stress : Not on file  Social Connections:   . Frequency of Communication with Friends and Family: Not on file  . Frequency of Social Gatherings with Friends and Family: Not on file  . Attends Religious Services: Not on file  . Active Member of Clubs or Organizations: Not on file  . Attends Banker Meetings: Not on file  . Marital Status: Not on file  Intimate Partner Violence:   . Fear of Current or Ex-Partner: Not on file  . Emotionally Abused: Not on file  . Physically Abused: Not on file  . Sexually Abused: Not on file   Family History  Problem Relation Age of Onset  . Depression Mother   . Anxiety disorder Mother   . Depression Sister   . Breast cancer Neg Hx   . Ovarian cancer Neg Hx   . Colon cancer Neg Hx    Past Surgical History:  Procedure Laterality Date  . TYMPANOSTOMY TUBE PLACEMENT       Mardella Layman, MD 03/14/20 1339

## 2020-03-19 ENCOUNTER — Ambulatory Visit (HOSPITAL_COMMUNITY)
Admission: EM | Admit: 2020-03-19 | Discharge: 2020-03-19 | Disposition: A | Discharge status: Home / Self Care | Payer: Medicaid Other | Attending: Family Medicine | Admitting: Family Medicine

## 2020-03-19 ENCOUNTER — Other Ambulatory Visit: Payer: Self-pay

## 2020-03-19 ENCOUNTER — Encounter (HOSPITAL_COMMUNITY): Payer: Self-pay | Admitting: *Deleted

## 2020-03-19 DIAGNOSIS — G43011 Migraine without aura, intractable, with status migrainosus: Secondary | ICD-10-CM

## 2020-03-19 MED ORDER — DEXAMETHASONE 4 MG PO TABS: 4.0000 mg | ORAL_TABLET | Freq: Every day | ORAL | 0 refills | Status: DC

## 2020-03-19 NOTE — ED Triage Notes (Signed)
Pt  Presents with on going HA since 03-14-20 . Pt reports N/V  And light sensitivity. Pt has not had relief with the medication  Given to her on 03-14-20

## 2020-03-19 NOTE — Discharge Instructions (Signed)
Return if headache persists.
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
## 2020-03-30 ENCOUNTER — Encounter: Payer: Medicaid Other | Admitting: Certified Nurse Midwife

## 2020-04-14 NOTE — L&D Delivery Note (Signed)
Delivery Note   Pt felt mild pressure and was checked and found to be c/c/+3 . She pushed twice and at 8:15 AM a healthy female was delivered vertex  (Presentation: LOA).  APGAR: 9, 9; weight  pending.   Placenta status:  Delivered spontaneously .  Cord:   with the following complications:  none, very thick.    Anesthesia: Epidural Episiotomy:  none Lacerations:  vaginal abrasions repaired for hemostasis with 2 interrupted sutures Suture Repair: 3.0 vicryl rapide Est. Blood Loss (mL):   Mom to postpartum.  Baby to Couplet care / Skin to Skin.  Pt did not receive 4 hours of PCN due to fast labor, received at 500am--d/w her 48 hour stay for baby  Oliver Pila 03/02/2021, 8:36 AM

## 2020-04-28 ENCOUNTER — Other Ambulatory Visit: Payer: Self-pay

## 2020-04-28 ENCOUNTER — Ambulatory Visit (HOSPITAL_COMMUNITY)
Admission: EM | Admit: 2020-04-28 | Discharge: 2020-04-28 | Disposition: A | Discharge status: Home / Self Care | Payer: Medicaid Other | Attending: Student | Admitting: Student

## 2020-04-28 ENCOUNTER — Encounter (HOSPITAL_COMMUNITY): Payer: Self-pay | Admitting: Emergency Medicine

## 2020-04-28 DIAGNOSIS — Z7952 Long term (current) use of systemic steroids: Secondary | ICD-10-CM | POA: Diagnosis not present

## 2020-04-28 DIAGNOSIS — Z20822 Contact with and (suspected) exposure to covid-19: Secondary | ICD-10-CM | POA: Diagnosis not present

## 2020-04-28 DIAGNOSIS — Z79899 Other long term (current) drug therapy: Secondary | ICD-10-CM | POA: Insufficient documentation

## 2020-04-28 DIAGNOSIS — Z8709 Personal history of other diseases of the respiratory system: Secondary | ICD-10-CM

## 2020-04-28 DIAGNOSIS — F909 Attention-deficit hyperactivity disorder, unspecified type: Secondary | ICD-10-CM | POA: Diagnosis not present

## 2020-04-28 DIAGNOSIS — F17210 Nicotine dependence, cigarettes, uncomplicated: Secondary | ICD-10-CM | POA: Insufficient documentation

## 2020-04-28 DIAGNOSIS — F32A Depression, unspecified: Secondary | ICD-10-CM | POA: Diagnosis not present

## 2020-04-28 DIAGNOSIS — R112 Nausea with vomiting, unspecified: Secondary | ICD-10-CM | POA: Insufficient documentation

## 2020-04-28 DIAGNOSIS — J069 Acute upper respiratory infection, unspecified: Secondary | ICD-10-CM | POA: Diagnosis not present

## 2020-04-28 DIAGNOSIS — J45909 Unspecified asthma, uncomplicated: Secondary | ICD-10-CM | POA: Insufficient documentation

## 2020-04-28 LAB — SARS CORONAVIRUS 2 (TAT 6-24 HRS): SARS Coronavirus 2: NEGATIVE

## 2020-04-28 MED ORDER — ALBUTEROL SULFATE HFA 108 (90 BASE) MCG/ACT IN AERS: 1.0000 | INHALATION_SPRAY | Freq: Four times a day (QID) | RESPIRATORY_TRACT | 0 refills | Status: DC | PRN

## 2020-04-28 MED ORDER — PROMETHAZINE-DM 6.25-15 MG/5ML PO SYRP: 5.0000 mL | ORAL_SOLUTION | Freq: Four times a day (QID) | ORAL | 0 refills | Status: DC | PRN

## 2020-04-28 MED ORDER — ONDANSETRON 8 MG PO TBDP: 8.0000 mg | ORAL_TABLET | Freq: Three times a day (TID) | ORAL | 0 refills | Status: DC | PRN
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
## 2020-04-28 NOTE — ED Triage Notes (Signed)
Patient c/o productive cough w/ "green" sputum, emesis, ABD pain, diarrhea and nasal congestion x 4 days.   Patient endorses being around daughter with a virus.   Patient denies fever at home.   Patient has taken Mucinex with no relief of symptoms.

## 2020-04-28 NOTE — Discharge Instructions (Addendum)
-  Albuterol inhaler as needed for cough -Promethazine DM cough syrup for congestion/cough. This could make you drowsy, so take at night before bed. -Zofran as needed for nausea/vomiting. You can dissolve this medication under your tongue.  -Immodium OTC as needed for diarrhea. -For fevers/chills, body aches, headaches- use Tylenol and Ibuprofen. You can alternate these for maximum effect. Use up to 3000mg  Tylenol daily and 3200mg  Ibuprofen daily. Make sure to take ibuprofen with food. Check the bottle of ibuprofen/tylenol for specific dosage instructions.   We are currently awaiting result of your PCR covid-19 test. This typically comes back in 1-2 days. We'll call you if the result is positive. Otherwise, the result will be sent electronically to your MyChart. You can also call this clinic and ask for your result via telephone.   Please isolate at home while awaiting these results. If your test is positive for Covid-19, continue to isolate at home for 5 days if you have mild symptoms, or a total of 10 days from symptom onset if you have more severe symptoms. If you quarantine for a shorter period of time (i.e. 5 days), make sure to wear a mask until day 10 of symptoms. Treat your symptoms at home with OTC remedies like tylenol/ibuprofen, mucinex, nyquil, etc. Seek medical attention if you develop high fevers, chest pain, shortness of breath, ear pain, facial pain, etc. Make sure to get up and move around every 2-3 hours while convalescing to help prevent blood clots. Drink plenty of fluids, and rest as much as possible.

## 2020-05-20 ENCOUNTER — Other Ambulatory Visit: Payer: Self-pay | Admitting: Certified Nurse Midwife

## 2020-05-31 ENCOUNTER — Encounter: Payer: Medicaid Other | Admitting: Certified Nurse Midwife

## 2020-06-04 ENCOUNTER — Telehealth: Payer: Self-pay

## 2020-06-13 NOTE — Telephone Encounter (Signed)
Error

## 2020-06-26 ENCOUNTER — Other Ambulatory Visit: Payer: Self-pay

## 2020-06-26 ENCOUNTER — Inpatient Hospital Stay (HOSPITAL_COMMUNITY): Payer: Medicaid Other

## 2020-06-26 ENCOUNTER — Inpatient Hospital Stay (HOSPITAL_COMMUNITY)
Admission: AD | Admit: 2020-06-26 | Discharge: 2020-06-26 | Disposition: A | Discharge status: Home / Self Care | Payer: Medicaid Other | Attending: Obstetrics and Gynecology | Admitting: Obstetrics and Gynecology

## 2020-06-26 ENCOUNTER — Encounter (HOSPITAL_COMMUNITY): Payer: Self-pay | Admitting: Obstetrics and Gynecology

## 2020-06-26 DIAGNOSIS — Z6790 Unspecified blood type, Rh positive: Secondary | ICD-10-CM | POA: Diagnosis not present

## 2020-06-26 DIAGNOSIS — R102 Pelvic and perineal pain: Secondary | ICD-10-CM | POA: Insufficient documentation

## 2020-06-26 DIAGNOSIS — O26891 Other specified pregnancy related conditions, first trimester: Secondary | ICD-10-CM | POA: Diagnosis not present

## 2020-06-26 DIAGNOSIS — O3680X0 Pregnancy with inconclusive fetal viability, not applicable or unspecified: Secondary | ICD-10-CM | POA: Diagnosis not present

## 2020-06-26 DIAGNOSIS — O99331 Smoking (tobacco) complicating pregnancy, first trimester: Secondary | ICD-10-CM | POA: Insufficient documentation

## 2020-06-26 DIAGNOSIS — Z3A01 Less than 8 weeks gestation of pregnancy: Secondary | ICD-10-CM | POA: Insufficient documentation

## 2020-06-26 DIAGNOSIS — Z79899 Other long term (current) drug therapy: Secondary | ICD-10-CM | POA: Insufficient documentation

## 2020-06-26 DIAGNOSIS — F17210 Nicotine dependence, cigarettes, uncomplicated: Secondary | ICD-10-CM | POA: Insufficient documentation

## 2020-06-26 DIAGNOSIS — O26899 Other specified pregnancy related conditions, unspecified trimester: Secondary | ICD-10-CM

## 2020-06-26 DIAGNOSIS — R109 Unspecified abdominal pain: Secondary | ICD-10-CM | POA: Diagnosis not present

## 2020-06-26 LAB — URINALYSIS, ROUTINE W REFLEX MICROSCOPIC
Bilirubin Urine: NEGATIVE
Glucose, UA: NEGATIVE mg/dL
Hgb urine dipstick: NEGATIVE
Ketones, ur: NEGATIVE mg/dL
Leukocytes,Ua: NEGATIVE
Nitrite: NEGATIVE
Protein, ur: NEGATIVE mg/dL
Specific Gravity, Urine: 1.021 (ref 1.005–1.030)
pH: 5 (ref 5.0–8.0)

## 2020-06-26 LAB — COMPREHENSIVE METABOLIC PANEL
ALT: 36 U/L (ref 0–44)
AST: 19 U/L (ref 15–41)
Albumin: 4.2 g/dL (ref 3.5–5.0)
Alkaline Phosphatase: 71 U/L (ref 38–126)
Anion gap: 6 (ref 5–15)
BUN: 6 mg/dL (ref 6–20)
CO2: 25 mmol/L (ref 22–32)
Calcium: 9.5 mg/dL (ref 8.9–10.3)
Chloride: 106 mmol/L (ref 98–111)
Creatinine, Ser: 0.77 mg/dL (ref 0.44–1.00)
GFR, Estimated: >60 mL/min (ref >60)
Glucose, Bld: 102 mg/dL — ABNORMAL HIGH (ref 70–99)
Potassium: 4.1 mmol/L (ref 3.5–5.1)
Sodium: 137 mmol/L (ref 135–145)
Total Bilirubin: 0.7 mg/dL (ref 0.3–1.2)
Total Protein: 7.6 g/dL (ref 6.5–8.1)

## 2020-06-26 LAB — CBC
HCT: 42.7 % (ref 36.0–46.0)
Hemoglobin: 14.6 g/dL (ref 12.0–15.0)
MCH: 31.3 pg (ref 26.0–34.0)
MCHC: 34.2 g/dL (ref 30.0–36.0)
MCV: 91.4 fL (ref 80.0–100.0)
Platelets: 233 10*3/uL (ref 150–400)
RBC: 4.67 MIL/uL (ref 3.87–5.11)
RDW: 12.4 % (ref 11.5–15.5)
WBC: 8.5 10*3/uL (ref 4.0–10.5)
nRBC: 0 % (ref 0.0–0.2)

## 2020-06-26 LAB — WET PREP, GENITAL
Sperm: NONE SEEN
Trich, Wet Prep: NONE SEEN
Yeast Wet Prep HPF POC: NONE SEEN

## 2020-06-26 LAB — POCT PREGNANCY, URINE: Preg Test, Ur: POSITIVE — AB

## 2020-06-26 LAB — HCG, QUANTITATIVE, PREGNANCY: hCG, Beta Chain, Quant, S: 136 m[IU]/mL — ABNORMAL HIGH (ref <5)

## 2020-06-26 NOTE — MAU Note (Signed)
Presents with c/o abdominal cramping since yesterday.  Denies VB, has vaginal discharge.  States has been getting hot and dizzy, hasn't passed out.  LMP 05/26/2020.  Has taken HPT.

## 2020-06-26 NOTE — MAU Provider Note (Signed)
History     CSN: [id number]  Arrival date and time: 06/26/20 1105   Event Date/Time   First Provider Initiated Contact with Patient 06/26/20 1156      Chief Complaint  Patient presents with  . Abdominal Pain   Ms. Christina Warner is a 25 y.o. G3P2002 at [redacted]w[redacted]d who presents to MAU for cramping that feels like menstrual cramps. Patient reports cramping started yesterday. Patient denies bleeding. Reports white discharge that is unchanged from prior to pregnancy.   OB History    Gravida  3   Para  2   Term  2   Preterm      AB      Living  2     SAB      IAB      Ectopic      Multiple  0   Live Births  2           Past Medical History:  Diagnosis Date  . ADHD (attention deficit hyperactivity disorder)   . Asthma   . Depression     Past Surgical History:  Procedure Laterality Date  . TYMPANOSTOMY TUBE PLACEMENT    . WISDOM TOOTH EXTRACTION      Family History  Problem Relation Age of Onset  . Depression Mother   . Anxiety disorder Mother   . Depression Sister   . Breast cancer Neg Hx   . Ovarian cancer Neg Hx   . Colon cancer Neg Hx     Social History   Tobacco Use  . Smoking status: Current Every Day Smoker    Packs/day: 1.00    Types: Cigarettes    Start date: 04/15/2011  . Smokeless tobacco: Never Used  . Tobacco comment: Quit while pregnant  Vaping Use  . Vaping Use: Never used  Substance Use Topics  . Alcohol use: Yes    Alcohol/week: 5.0 standard drinks    Types: 5 Shots of liquor per week  . Drug use: Not Currently    Frequency: 1.0 times per week    Types: Marijuana    Allergies: No Known Allergies  Medications Prior to Admission  Medication Sig Dispense Refill Last Dose  . albuterol (VENTOLIN HFA) 108 (90 Base) MCG/ACT inhaler Inhale 1-2 puffs into the lungs every 6 (six) hours as needed for wheezing or shortness of breath. 1 each 0 Past Week at Unknown time  . acetaminophen (TYLENOL) 500 MG tablet Take 500 mg by mouth every  6 (six) hours as needed for moderate pain.   More than a month at Unknown time  . dexamethasone (DECADRON) 4 MG tablet Take 1 tablet (4 mg total) by mouth daily. 2 tablet 0   . escitalopram (LEXAPRO) 10 MG tablet TAKE 1 TABLET BY MOUTH EVERY DAY 90 tablet 0   . ondansetron (ZOFRAN ODT) 8 MG disintegrating tablet Take 1 tablet (8 mg total) by mouth every 8 (eight) hours as needed for nausea or vomiting. 20 tablet 0   . promethazine-dextromethorphan (PROMETHAZINE-DM) 6.25-15 MG/5ML syrup Take 5 mLs by mouth 4 (four) times daily as needed for cough. 118 mL 0     Review of Systems  Constitutional: Negative for chills, diaphoresis, fatigue and fever.  Eyes: Negative for visual disturbance.  Respiratory: Negative for shortness of breath.   Cardiovascular: Negative for chest pain.  Gastrointestinal: Negative for abdominal pain, constipation, diarrhea, nausea and vomiting.  Genitourinary: Positive for pelvic pain. Negative for dysuria, flank pain, frequency, urgency, vaginal bleeding and vaginal discharge.  Neurological: Negative for dizziness, weakness, light-headedness and headaches.   Physical Exam   Blood pressure 132/85, pulse (!) 103, temperature 97.9 F (36.6 C), temperature source Oral, resp. rate 18, height 5' (1.524 m), weight 77.6 kg, last menstrual period 05/26/2020, SpO2 100 %, not currently breastfeeding.  Patient Vitals for the past 24 hrs:  BP Temp Temp src Pulse Resp SpO2 Height Weight  06/26/20 1123 132/85 97.9 F (36.6 C) Oral (!) 103 18 100 % -- --  06/26/20 1118 -- -- -- -- -- -- 5' (1.524 m) 77.6 kg   Physical Exam Vitals and nursing note reviewed.  Constitutional:      General: She is not in acute distress.    Appearance: Normal appearance. She is not ill-appearing, toxic-appearing or diaphoretic.  HENT:     Head: Normocephalic and atraumatic.  Pulmonary:     Effort: Pulmonary effort is normal.  Neurological:     Mental Status: She is alert and oriented to person,  place, and time.  Psychiatric:        Mood and Affect: Mood normal.        Behavior: Behavior normal.        Thought Content: Thought content normal.        Judgment: Judgment normal.    Results for orders placed or performed during the hospital encounter of 06/26/20 (from the past 24 hour(s))  Urinalysis, Routine w reflex microscopic Urine, Clean Catch     Status: Abnormal   Collection Time: 06/26/20 11:22 AM  Result Value Ref Range   Color, Urine YELLOW YELLOW   APPearance HAZY (A) CLEAR   Specific Gravity, Urine 1.021 1.005 - 1.030   pH 5.0 5.0 - 8.0   Glucose, UA NEGATIVE NEGATIVE mg/dL   Hgb urine dipstick NEGATIVE NEGATIVE   Bilirubin Urine NEGATIVE NEGATIVE   Ketones, ur NEGATIVE NEGATIVE mg/dL   Protein, ur NEGATIVE NEGATIVE mg/dL   Nitrite NEGATIVE NEGATIVE   Leukocytes,Ua NEGATIVE NEGATIVE  Pregnancy, urine POC     Status: Abnormal   Collection Time: 06/26/20 11:22 AM  Result Value Ref Range   Preg Test, Ur POSITIVE (A) NEGATIVE  Wet prep, genital     Status: Abnormal   Collection Time: 06/26/20 12:09 PM   Specimen: Vaginal  Result Value Ref Range   Yeast Wet Prep HPF POC NONE SEEN NONE SEEN   Trich, Wet Prep NONE SEEN NONE SEEN   Clue Cells Wet Prep HPF POC PRESENT (A) NONE SEEN   WBC, Wet Prep HPF POC MANY (A) NONE SEEN   Sperm NONE SEEN   CBC     Status: None   Collection Time: 06/26/20 12:17 PM  Result Value Ref Range   WBC 8.5 4.0 - 10.5 K/uL   RBC 4.67 3.87 - 5.11 MIL/uL   Hemoglobin 14.6 12.0 - 15.0 g/dL   HCT 01.6 01.0 - 93.2 %   MCV 91.4 80.0 - 100.0 fL   MCH 31.3 26.0 - 34.0 pg   MCHC 34.2 30.0 - 36.0 g/dL   RDW 35.5 73.2 - 20.2 %   Platelets 233 150 - 400 K/uL   nRBC 0.0 0.0 - 0.2 %  Comprehensive metabolic panel     Status: Abnormal   Collection Time: 06/26/20 12:17 PM  Result Value Ref Range   Sodium 137 135 - 145 mmol/L   Potassium 4.1 3.5 - 5.1 mmol/L   Chloride 106 98 - 111 mmol/L   CO2 25 22 - 32 mmol/L   Glucose, Bld  102 (H) 70 - 99  mg/dL   BUN 6 6 - 20 mg/dL   Creatinine, Ser 6.33 0.44 - 1.00 mg/dL   Calcium 9.5 8.9 - 35.4 mg/dL   Total Protein 7.6 6.5 - 8.1 g/dL   Albumin 4.2 3.5 - 5.0 g/dL   AST 19 15 - 41 U/L   ALT 36 0 - 44 U/L   Alkaline Phosphatase 71 38 - 126 U/L   Total Bilirubin 0.7 0.3 - 1.2 mg/dL   GFR, Estimated >56 >25 mL/min   Anion gap 6 5 - 15  hCG, quantitative, pregnancy     Status: Abnormal   Collection Time: 06/26/20 12:17 PM  Result Value Ref Range   hCG, Beta Chain, Quant, S 136 (H) <5 mIU/mL   US OB LESS THAN 14 WEEKS WITH OB TRANSVAGINAL  Result Date: 06/26/2020 CLINICAL DATA:  Abdominal cramping EXAM: OBSTETRIC <14 WK [place] AND TRANSVAGINAL OB US TECHNIQUE: Both transabdominal and transvaginal ultrasound examinations were performed for complete evaluation of the gestation as well as the maternal uterus, adnexal regions, and pelvic cul-de-sac. Transvaginal technique was performed to assess early pregnancy. COMPARISON:  None. FINDINGS: Intrauterine gestational sac: None Yolk sac:  Not Visualized. Embryo:  Not Visualized. Cardiac Activity: Not Visualized. Heart Rate:   bpm MSD:   mm    w     d CRL:    mm    w    d                  [place] EDC: Subchorionic hemorrhage:  None visualized. Maternal uterus/adnexae: 3.3 cm simple cyst in the left ovary. No adnexal mass or free fluid. IMPRESSION: No intrauterine pregnancy visualized. Differential considerations would include early intrauterine pregnancy too early to visualize, spontaneous abortion, or occult ectopic pregnancy. Recommend close clinical followup and serial quantitative beta HCGs and ultrasounds. 3.3 cm left ovarian cyst. Electronically Signed   By: Charlett Nose M.D.   On: 06/26/2020 13:32    MAU Course  Procedures  MDM -r/o ectopic -UA: hazy, otherwise WNL, sending urine for culture based on symptoms -CBC: WNL -CMP: WNL -[place]: PUL -hCG: 136 -ABO: O Positive -WetPrep: +ClueCells (isolated finding not requiring treatment) -GC/CT  collected -Discussed with client the diagnosis of pregnancy of unknown anatomic location.  Three possibilities of outcome are: a healthy pregnancy that is too early to see a yolk sac to confirm the pregnancy is in the uterus, a pregnancy that is not healthy and has not developed and will not develop, and an ectopic pregnancy that is in the abdomen that cannot be identified at this time.  And ectopic pregnancy can be a life threatening situation as a pregnancy needs to be in the uterus which is a muscle and can stretch to accommodate the growth of a pregnancy.  Other structures in the pelvis and abdomen as not muscular and do not stretch with the growth of a pregnancy.  Worst case scenario is that a structure ruptures with a growing pregnancy not in the uterus and and internal hemorrhage can be a life threatening situation.  We need to follow the progression of this pregnancy carefully.  We need to check another serum pregnancy hormone level to determine if the levels are rising appropriately  and to determine the next steps that are needed for you. Patient's questions were answered. -pt discharged to home in stable condition  Orders Placed This Encounter  Procedures  . Wet prep, genital    Standing Status:  Standing    Number of Occurrences:   1  . Culture, OB Urine    Standing Status:   Standing    Number of Occurrences:   1  . US OB LESS THAN 14 WEEKS WITH OB TRANSVAGINAL    Standing Status:   Standing    Number of Occurrences:   1    Order Specific Question:   Symptom/Reason for Exam    Answer:   Pelvic cramping in antepartum period [[id number]][[id number]]  . Urinalysis, Routine w reflex microscopic Urine, Clean Catch    Standing Status:   Standing    Number of Occurrences:   1  . CBC    Standing Status:   Standing    Number of Occurrences:   1  . Comprehensive metabolic panel    Standing Status:   Standing    Number of Occurrences:   1  . hCG, quantitative, pregnancy    Standing Status:   Standing     Number of Occurrences:   1  . Pregnancy, urine POC    Standing Status:   Standing    Number of Occurrences:   1  . Discharge patient    Order Specific Question:   Discharge disposition    Answer:   01-Home or Self Care [1]    Order Specific Question:   Discharge patient date    Answer:   06/26/2020   No orders of the defined types were placed in this encounter.  Assessment and Plan   1. Pregnancy of unknown anatomic location   2. Pelvic cramping in antepartum period   3. Blood type, Rh positive    Allergies as of 06/26/2020   No Known Allergies     Medication List    TAKE these medications   acetaminophen 500 MG tablet Commonly known as: TYLENOL Take 500 mg by mouth every 6 (six) hours as needed for moderate pain.   albuterol 108 (90 Base) MCG/ACT inhaler Commonly known as: VENTOLIN HFA Inhale 1-2 puffs into the lungs every 6 (six) hours as needed for wheezing or shortness of breath.   dexamethasone 4 MG tablet Commonly known as: DECADRON Take 1 tablet (4 mg total) by mouth daily.   escitalopram 10 MG tablet Commonly known as: LEXAPRO TAKE 1 TABLET BY MOUTH EVERY DAY   ondansetron 8 MG disintegrating tablet Commonly known as: Zofran ODT Take 1 tablet (8 mg total) by mouth every 8 (eight) hours as needed for nausea or vomiting.   promethazine-dextromethorphan 6.25-15 MG/5ML syrup Commonly known as: PROMETHAZINE-DM Take 5 mLs by mouth 4 (four) times daily as needed for cough.      -will call with culture results, if positive -safe meds in pregnancy list given -list of OB providers given -discussed ectopic vs. SAB vs. miscarriage -strict ectopic precautions given -return MAU precautions -[facility] repeat hCG, pt [place] be [place] with [place] -pt discharged to home in stable condition  Joni Reiningicole E Leandre Wien 06/26/2020, 3:18 PM

## 2020-06-26 NOTE — Discharge Instructions (Signed)
Ectopic Pregnancy  An ectopic pregnancy happens when a fertilized egg attaches (implants) outside the uterus. In a normal pregnancy, a fertilized egg implants in the uterus. An ectopic pregnancy cannot develop into a healthy baby. Most ectopic pregnancies occur in one of the fallopian tubes, which is where an egg travels from an ovary to get to the uterus. This is called a tubal pregnancy. An ectopic pregnancy can also happen on an ovary, on the cervix, or in the abdomen. When a fertilized egg implants on tissue outside the uterus and begins to grow, it may cause the tissue to tear or burst. This is known as a ruptured ectopic pregnancy. The tear or burst causes internal bleeding. This may cause intense pain in the abdomen. An ectopic pregnancy is a medical emergency and can be life-threatening. What are the causes? The most common cause of this condition is damage to one of the fallopian tubes. A fallopian tube may be narrowed or blocked, and that stops the fertilized egg from reaching the uterus. Sometimes, the cause of this condition is not known. What increases the risk? The following factors may make you more likely to develop this condition:  Having gone through infertility treatment before.  Having had an ectopic pregnancy before.  Having had surgery to have the fallopian tubes tied.  Becoming pregnant while using an intrauterine device for birth control.  Taking birth control pills before the age of 16. Other risk factors include:  Smoking.  Alcohol use.  History of DES exposure. DES is a medicine that was used until 1971 and affected babies whose mothers took the medicine. What are the signs or symptoms? Common symptoms of this condition include:  Missing a menstrual period.  Nausea or tiredness.  Tender breasts.  Other normal pregnancy symptoms. Other symptoms may include:  Pain during sex.  Vaginal bleeding or spotting.  Cramping or pain in the lower abdomen.  A  fast heartbeat, low blood pressure, and sweating.  Pain or increased pressure while having a bowel movement. Symptoms of a ruptured ectopic pregnancy and internal bleeding may include:  Sudden, severe pain in the abdomen.  Dizziness, weakness, feeling light-headed, or fainting.  Pain in the shoulder or neck area. How is this diagnosed? This condition is diagnosed by:  A blood test to check for the pregnancy hormone.  A pelvic exam to find painful areas or a mass in the abdomen.  Ultrasound. A probe is inserted into the vagina to see if there is a pregnancy in or outside the uterus.  Taking a sample of tissue from the uterus.  Surgery to look closely at the fallopian tubes through an incision in the abdomen. How is this treated? This condition is usually treated with medicine or surgery. Sometimes, ectopic pregnancies can resolve on their own, under close monitoring by your health care provider. Medicine A medicine called methotrexate may be given to cause the pregnancy tissue to be absorbed. The medicine may be given if:  The diagnosis is made early, with no signs of active bleeding.  The fallopian tube has not torn or burst. You will need blood tests to make sure the medicine is working. It may take 4-6 weeks for the pregnancy tissues to be absorbed. Surgery Surgery may be performed to:  Remove the pregnancy tissue.  Stop internal bleeding.  Remove part or all of the fallopian tube.  Remove the uterus. This is rare. After surgery, you may need to have blood tests to make sure the surgery worked.   Follow these instructions at home: Medicines  Take over-the-counter and prescription medicines only as told by your health care provider.  Ask your health care provider if the medicine prescribed to you: ? Requires you to avoid driving or using machinery. ? Can cause constipation. You may need to take these actions to prevent or treat constipation:  Drink enough fluid to  keep your urine pale yellow.  Take over-the-counter or prescription medicines.  Eat foods that are high in fiber, such as beans, whole grains, and fresh fruits and vegetables.  Limit foods that are high in fat and processed sugars, such as fried or sweet foods. General instructions  Rest or limit your activity, if told by your health care provider.  Do not have sex or put anything in your vagina, such as tampons or douches, for 6 weeks or until your health care provider says it is safe.  Do not lift anything that is heavier than 10 lb (4.5 kg), or the limit that you are told, until your health care provider says that it is safe.  Return to your normal activities as told by your health care provider. Ask your health care provider what activities are safe for you.  Keep all follow-up visits. This is important. Contact a health care provider if:  You have a fever or chills.  You have nausea and vomiting. Get help right away if:  Your pain gets worse or is not relieved by medicine.  You feel dizzy or weak.  You feel light-headed or you faint.  You have sudden, severe pain in your abdomen.  You have sudden pain in the shoulder or neck area. Summary  An ectopic pregnancy happens when a fertilized egg implants outside the uterus. Most ectopic pregnancies occur in one of the fallopian tubes.  An ectopic pregnancy is a medical emergency and can be life-threatening.  The most common cause of this condition is damage to one of the fallopian tubes.  This condition is usually treated with medicine or surgery. Some ectopic pregnancies resolve on their own, under close monitoring by your health care provider. This information is not intended to replace advice given to you by your health care provider. Make sure you discuss any questions you have with your health care provider. Document Revised: 07/12/2019 Document Reviewed: 07/12/2019 Elsevier Patient Education  2021 Elsevier  Inc.                        Safe Medications in Pregnancy    Acne: Benzoyl Peroxide Salicylic Acid  Backache/Headache: Tylenol: 2 regular strength every 4 hours OR              2 Extra strength every 6 hours  Colds/Coughs/Allergies: Benadryl (alcohol free) 25 mg every 6 hours as needed Breath right strips Claritin Cepacol throat lozenges Chloraseptic throat spray Cold-Eeze- up to three times per day Cough drops, alcohol free Flonase (by prescription only) Guaifenesin Mucinex Robitussin DM (plain only, alcohol free) Saline nasal spray/drops Sudafed (pseudoephedrine) & Actifed ** use only after [redacted] weeks gestation and if you do not have high blood pressure Tylenol Vicks Vaporub Zinc lozenges Zyrtec   Constipation: Colace Ducolax suppositories Fleet enema Glycerin suppositories Metamucil Milk of magnesia Miralax Senokot Smooth move tea  Diarrhea: Kaopectate Imodium A-D  *NO pepto Bismol  Hemorrhoids: Anusol Anusol HC Preparation H Tucks  Indigestion: Tums Maalox Mylanta Zantac  Pepcid  Insomnia: Benadryl (alcohol free) 25mg  every 6 hours as needed Tylenol PM Unisom, no  Gelcaps  Leg Cramps: Tums MagGel  Nausea/Vomiting:  Bonine Dramamine Emetrol Ginger extract Sea bands Meclizine  Nausea medication to take during pregnancy:  Unisom (doxylamine succinate 25 mg tablets) Take one tablet daily at bedtime. If symptoms are not adequately controlled, the dose can be increased to a maximum recommended dose of two tablets daily (1/2 tablet in the morning, 1/2 tablet mid-afternoon and one at bedtime). Vitamin B6 100mg  tablets. Take one tablet twice a day (up to 200 mg per day).  Skin Rashes: Aveeno products Benadryl cream or 25mg  every 6 hours as needed Calamine Lotion 1% cortisone cream  Yeast infection: Gyne-lotrimin 7 Monistat 7   **If taking multiple medications, please check labels to avoid duplicating the same active  ingredients **take medication as directed on the label ** Do not exceed 4000 mg of tylenol in 24 hours **Do not take medications that contain aspirin or ibuprofen          Prenatal Care Providers           [facility] @ [facility] - accepts patients without insurance  Phone: [phone]  [facility] @ [facility]   Phone: [phone]  [facility]       Phone: [phone]            [facility] @ [place]     Phone: [phone]          [facility]   Phone: [phone]  [facility] @ [facility] - accepts patients without insurance  Phone: [phone]  [facility] @ [facility] Phone: [phone]     [facility] - accepts patients without insurance Phone: [phone]  [facility]  Phone: [phone]  [facility] Phone: [phone]  [facility] Phone: [phone]  [facility] Phone: [phone]  [facility] Phone: [phone]  [facility]  Phone: [phone]         Obstetrics: Normal and Problem Pregnancies (7th ed., pp. 102-121). [place], [place]: Elsevier."> Textbook of Family Medicine (9th ed., pp. [phone]). [place]: Elsevier [employer].">  First Trimester of Pregnancy  The first trimester of pregnancy starts on the first day of your last menstrual period until the end of week 12. This is months 1 through 3 of pregnancy. A week after a sperm fertilizes an egg, the egg will implant into the wall of the uterus and begin to develop into a baby. By the end of 12 weeks, all the baby's organs will be formed and the baby will be 2-3 inches in size. Body changes during your first trimester Your body goes through many changes during pregnancy. The changes vary and generally return to normal after your  baby is born. Physical changes  You may gain or lose weight.  Your breasts may begin to grow larger and become tender. The tissue that surrounds your nipples (areola) may become darker.  Dark spots or blotches (chloasma or mask of pregnancy) may develop on your face.  You may have changes in your hair. These can include thickening or thinning of your hair or changes in texture. Health changes  You may feel nauseous, and you may vomit.  You may have heartburn.  You may develop headaches.  You may develop constipation.  Your gums may bleed and may be sensitive to brushing and flossing. Other changes  You may tire easily.  You may urinate more  often.  Your menstrual periods will stop.  You may have a loss of appetite.  You may develop cravings for certain kinds of food.  You may have changes in your emotions from day to day.  You may have more vivid and strange dreams. Follow these instructions at home: Medicines  Follow your health care provider's instructions regarding medicine use. Specific medicines may be either safe or unsafe to take during pregnancy. Do not take any medicines unless told to by your health care provider.  Take a prenatal vitamin that contains at least 600 micrograms (mcg) of folic acid. Eating and drinking  Eat a healthy diet that includes fresh fruits and vegetables, whole grains, good sources of protein such as meat, eggs, or tofu, and low-fat dairy products.  Avoid raw meat and unpasteurized juice, milk, and cheese. These carry germs that can harm you and your baby.  If you feel nauseous or you vomit: ? Eat 4 or 5 small meals a day instead of 3 large meals. ? Try eating a few soda crackers. ? Drink liquids between meals instead of during meals.  You may need to take these actions to prevent or treat constipation: ? Drink enough fluid to keep your urine pale yellow. ? Eat foods that are high in fiber, such as beans, whole grains, and fresh  fruits and vegetables. ? Limit foods that are high in fat and processed sugars, such as fried or sweet foods. Activity  Exercise only as directed by your health care provider. Most people can continue their usual exercise routine during pregnancy. Try to exercise for 30 minutes at least 5 days a week.  Stop exercising if you develop pain or cramping in the lower abdomen or lower back.  Avoid exercising if it is very hot or humid or if you are at high altitude.  Avoid heavy lifting.  If you choose to, you may have sex unless your health care provider tells you not to. Relieving pain and discomfort  Wear a good support bra to relieve breast tenderness.  Rest with your legs elevated if you have leg cramps or low back pain.  If you develop bulging veins (varicose veins) in your legs: ? Wear support hose as told by your health care provider. ? Elevate your feet for 15 minutes, 3-4 times a day. ? Limit salt in your diet. Safety  Wear your seat belt at all times when driving or riding in a car.  Talk with your health care provider if someone is verbally or physically abusive to you.  Talk with your health care provider if you are feeling sad or have thoughts of hurting yourself. Lifestyle  Do not use hot tubs, steam rooms, or saunas.  Do not douche. Do not use tampons or scented sanitary pads.  Do not use herbal remedies, alcohol, illegal drugs, or medicines that are not approved by your health care provider. Chemicals in these products can harm your baby.  Do not use any products that contain nicotine or tobacco, such as cigarettes, e-cigarettes, and chewing tobacco. If you need help quitting, ask your health care provider.  Avoid cat litter boxes and soil used by cats. These carry germs that can cause birth defects in the baby and possibly loss of the unborn baby (fetus) by miscarriage or stillbirth. General instructions  During routine prenatal visits in the first trimester,  your health care provider will do a physical exam, perform necessary tests, and ask you how things are going. Keep all  follow-up visits. This is important.  Ask for help if you have counseling or nutritional needs during pregnancy. Your health care provider can offer advice or refer you to specialists for help with various needs.  Schedule a dentist appointment. At home, brush your teeth with a soft toothbrush. Floss gently.  Write down your questions. Take them to your prenatal visits. Where to find more information  American Pregnancy Association: americanpregnancy.org  [employer] of Obstetricians and Gynecologists: https://www.todd-brady.net/  Office on [employer] Health: MightyReward.co.nz Contact a health care provider if you have:  Dizziness.  A fever.  Mild pelvic cramps, pelvic pressure, or nagging pain in the abdominal area.  Nausea, vomiting, or diarrhea that lasts for 24 hours or longer.  A bad-smelling vaginal discharge.  Pain when you urinate.  Known exposure to a contagious illness, such as chickenpox, measles, Zika virus, HIV, or hepatitis. Get help right away if you have:  Spotting or bleeding from your vagina.  Severe abdominal cramping or pain.  Shortness of breath or chest pain.  Any kind of trauma, such as from a fall or a car crash.  New or increased pain, swelling, or redness in an arm or leg. Summary  The first trimester of pregnancy starts on the first day of your last menstrual period until the end of week 12 (months 1 through 3).  Eating 4 or 5 small meals a day rather than 3 large meals may help to relieve nausea and vomiting.  Do not use any products that contain nicotine or tobacco, such as cigarettes, e-cigarettes, and chewing tobacco. If you need help quitting, ask your health care provider.  Keep all follow-up visits. This is important. This information is not intended to replace advice given to you by your health  care provider. Make sure you discuss any questions you have with your health care provider. Document Revised: 09/07/2019 Document Reviewed: 07/14/2019 Elsevier Patient Education  2021 Elsevier Inc.        Miscarriage A miscarriage is the loss of pregnancy before the 20th week. Most miscarriages happen during the first 3 months of pregnancy. Sometimes, a miscarriage can happen before a woman knows that she is pregnant. Having a miscarriage can be an emotional experience. If you have had a miscarriage, talk with your health care provider about any questions you may have about the loss of your baby, the grieving process, and your plans for future pregnancy. What are the causes? Many times, the cause of a miscarriage is not known. What increases the risk? The following factors may make a pregnant woman more likely to have a miscarriage: Certain medical conditions  Conditions that affect the hormone balance in the body, such as thyroid disease or polycystic ovary syndrome.  Diabetes.  Autoimmune disorders.  Infections.  Bleeding disorders.  Obesity. Lifestyle factors  Using products with tobacco or nicotine in them or being exposed to tobacco smoke.  Having alcohol.  Having large amounts of caffeine.  Recreational drug use. Problems with reproductive organs or structures  Cervical insufficiency. This is when the lowest part of the uterus (cervix) opens and thins before pregnancy is at term.  Having a condition called Asherman syndrome. This syndrome causes scarring in the uterus or causes the uterus to be abnormal in structure.  Fibrous growths, called fibroids, in the uterus.  Congenital abnormalities. These problems are present at birth.  Infection of the cervix or uterus. Personal or medical history  Injury (trauma).  Having had a miscarriage before.  Being younger than  age 63 or older than age 7.  Exposure to harmful substances in the environment. This may  include radiation or heavy metals, such as lead.  Use of certain medicines. What are the signs or symptoms? Symptoms of this condition include:  Vaginal bleeding or spotting, with or without cramps or pain.  Pain or cramping in the abdomen or lower back.  Fluid or tissue coming out of the vagina. How is this diagnosed? This condition may be diagnosed based on:  A physical exam.  Ultrasound.  Lab tests, such as blood tests, urine tests, or swabs for infection. How is this treated? Treatment for a miscarriage is sometimes not needed if all the pregnancy tissue that was in the uterus comes out on its own, and there are no other problems such as infection or heavy bleeding. In other cases, this condition may be treated with:  Dilation and curettage (D&C). In this procedure, the cervix is stretched open and any remaining pregnancy tissue is removed from the lining of the uterus (endometrium).  Medicines. These may include: ? Antibiotic medicine, to treat infection. ? Medicine to help any remaining pregnancy tissue come out of the body. ? Medicine to reduce (contract) the size of the uterus. These medicines may be given if there is a lot of bleeding. If you have Rh-negative blood, you may be given an injection of a medicine called Rho(D) immune globulin. This medicine helps prevent problems with future pregnancies. Follow these instructions at home: Medicines  Take over-the-counter and prescription medicines only as told by your health care provider.  If you were prescribed antibiotic medicine, take it as told by your health care provider. Do not stop taking the antibiotic even if you start to feel better. Activity  Rest as told by your health care provider. Ask your health care provider what activities are safe for you.  Have someone help with home and family responsibilities during this time. General instructions  Monitor how much tissue or blood clot material comes out of the  vagina.  Do not have sex, douche, or put anything, such as tampons, in your vagina until your health care provider says it is okay.  To help you and your partner with the grieving process, talk with your health care provider or get counseling.  When you are ready, meet with your health care provider to discuss any important steps you should take for your health. Also, discuss steps you should take to have a healthy pregnancy in the future.  Keep all follow-up visits. This is important.   Where to find more information  The [employer] of Obstetricians and Gynecologists: acog.org  U.S. Department of Health and Cytogeneticist of Women's Health: http://hoffman.com/ Contact a health care provider if:  You have a fever or chills.  There is bad-smelling fluid coming from the vagina.  You have more bleeding instead of less.  Tissue or blood clots come out of your vagina. Get help right away if:  You have severe cramps or pain in your back or abdomen.  Heavy bleeding soaks through 2 large sanitary pads an hour for more than 2 hours.  You become light-headed or weak.  You faint.  You feel sad, and your sadness takes over your thoughts.  You think about hurting yourself. If you ever feel like you may hurt yourself or others, or have thoughts about taking your own life, get help right away. Go to your nearest emergency department or:  Call your local emergency services (  911 in the [place]  Call a suicide crisis helpline, such as the National Suicide Prevention Lifeline at 1-800-273628-819-7458 is open 24 hours a day in the [place]  Text the Crisis Text Line at (606) 370-2414 (in the [place] Summary  Most miscarriages happen in the first 3 months of pregnancy. Sometimes miscarriage happens before a woman knows that she is pregnant.  Follow instructions from your health care provider about medicines and activity.  To help you and your partner with grieving, talk with your  health care provider or get counseling.  Keep all follow-up visits. This information is not intended to replace advice given to you by your health care provider. Make sure you discuss any questions you have with your health care provider. Document Revised: 09/30/2019 Document Reviewed: 09/30/2019 Elsevier Patient Education  2021 [employer].

## 2020-06-27 LAB — GC/CHLAMYDIA PROBE AMP (~~LOC~~) NOT AT ARMC
Chlamydia: NEGATIVE
Comment: NEGATIVE
Comment: NORMAL
Neisseria Gonorrhea: NEGATIVE

## 2020-06-28 ENCOUNTER — Other Ambulatory Visit (INDEPENDENT_AMBULATORY_CARE_PROVIDER_SITE_OTHER): Payer: Medicaid Other | Admitting: General Practice

## 2020-06-28 ENCOUNTER — Other Ambulatory Visit: Payer: Self-pay

## 2020-06-28 ENCOUNTER — Encounter: Payer: Medicaid Other | Admitting: Certified Nurse Midwife

## 2020-06-28 VITALS — BP 115/66 | HR 84 | Ht 60.0 in | Wt 167.0 lb

## 2020-06-28 DIAGNOSIS — O3680X0 Pregnancy with inconclusive fetal viability, not applicable or unspecified: Secondary | ICD-10-CM

## 2020-06-28 LAB — CULTURE, OB URINE

## 2020-06-28 LAB — BETA HCG QUANT (REF LAB): hCG Quant: 330 m[IU]/mL

## 2020-06-28 NOTE — Progress Notes (Signed)
Patient presents to office today for stat bhcg following up from MAU visit on 3/15. Patient reports continued cramping but pain is less than before, currently rates at a 2. Discussed with patient we are monitoring your bhcg levels today, results take approximately 2 hours to finalize and will be reviewed with a doctor in the office. Advised we will then call you with results and updated plan of care. Patient verbalized understanding.  Reviewed results with Dr Debroah Loop who finds appropriate rise in hcg levels, patient should have follow up lab in 1 week.   Called patient and discussed results. Reviewed recommendation of repeat bhcg in 1 week and ectopic precautions. Patient verbalized understanding & states she can come 3/23 @ 130.   Chase Caller RN BSN 06/28/20

## 2020-07-04 ENCOUNTER — Other Ambulatory Visit: Payer: Self-pay

## 2020-07-04 ENCOUNTER — Other Ambulatory Visit: Payer: Medicaid Other

## 2020-07-04 DIAGNOSIS — O3680X0 Pregnancy with inconclusive fetal viability, not applicable or unspecified: Secondary | ICD-10-CM

## 2020-07-05 LAB — BETA HCG QUANT (REF LAB): hCG Quant: 3730 m[IU]/mL

## 2020-07-05 NOTE — Progress Notes (Signed)
Schedule f/u US next week

## 2020-07-09 ENCOUNTER — Telehealth: Payer: Self-pay | Admitting: *Deleted

## 2020-07-09 DIAGNOSIS — O3680X0 Pregnancy with inconclusive fetal viability, not applicable or unspecified: Secondary | ICD-10-CM

## 2020-07-09 NOTE — Telephone Encounter (Signed)
Called pt to discuss plan of care. Per chart review, pt has already seen [facility] results from 3/23 and note from Dr. Debroah Loop. I advised her of [place] scheduled on 4/6 @ 1100, arrive @ 1045. Instructions were given for her to have a full bladder. She voiced understanding of all information and instructions given.

## 2020-07-18 ENCOUNTER — Ambulatory Visit
Admission: RE | Admit: 2020-07-18 | Discharge: 2020-07-18 | Disposition: A | Discharge status: Home / Self Care | Payer: Medicaid Other | Source: Ambulatory Visit | Attending: Obstetrics & Gynecology | Admitting: Obstetrics & Gynecology

## 2020-07-18 ENCOUNTER — Other Ambulatory Visit: Payer: Self-pay

## 2020-07-18 ENCOUNTER — Ambulatory Visit (INDEPENDENT_AMBULATORY_CARE_PROVIDER_SITE_OTHER): Payer: Medicaid Other

## 2020-07-18 DIAGNOSIS — O3680X0 Pregnancy with inconclusive fetal viability, not applicable or unspecified: Secondary | ICD-10-CM | POA: Insufficient documentation

## 2020-07-18 MED ORDER — DOXYLAMINE-PYRIDOXINE 10-10 MG PO TBEC: 1.0000 | DELAYED_RELEASE_TABLET | ORAL | 0 refills | Status: DC | PRN

## 2020-07-18 NOTE — Progress Notes (Signed)
Chart reviewed for nurse visit. Agree with plan of care.   [place] images reviewed, notable for likely di-di twin pregnancy, definite IUP. Twin B measuring about a week behind with FHR in 90's. Discussed with patient this may just be due to different implantation times and early gestation but this is sometimes a poor prognostic sign. Encouraged to establish OB care ASAP for high risk pregnancy, plans to do so at [facility].   Venora Maples, MD 07/18/20 12:24 PM

## 2020-07-18 NOTE — Progress Notes (Signed)
Pt here today for [place] results. Pt states having nausea/vomiting. Pt states has OBGYN at [facility]. Pt advised to call and make new OB appt within in the next 2 weeks.  Dr Crissie Reese ok to send in 30 tabs of Diclegis Rx. Rx sent to pharmacy on file.   Judeth Cornfield, RN  07/18/20.

## 2020-07-19 ENCOUNTER — Encounter: Payer: Self-pay | Admitting: Certified Nurse Midwife

## 2020-08-09 LAB — OB RESULTS CONSOLE RPR: RPR: NONREACTIVE

## 2020-08-09 LAB — OB RESULTS CONSOLE RUBELLA ANTIBODY, IGM: Rubella: IMMUNE

## 2020-08-09 LAB — OB RESULTS CONSOLE HIV ANTIBODY (ROUTINE TESTING): HIV: NONREACTIVE

## 2020-08-09 LAB — OB RESULTS CONSOLE GC/CHLAMYDIA: Gonorrhea: NEGATIVE

## 2020-08-09 LAB — OB RESULTS CONSOLE HEPATITIS B SURFACE ANTIGEN: Hepatitis B Surface Ag: NEGATIVE

## 2021-01-02 ENCOUNTER — Other Ambulatory Visit: Payer: Self-pay

## 2021-01-02 ENCOUNTER — Inpatient Hospital Stay (HOSPITAL_COMMUNITY)
Admission: AD | Admit: 2021-01-02 | Discharge: 2021-01-02 | Disposition: A | Discharge status: Home / Self Care | Payer: Medicaid Other | Attending: Obstetrics and Gynecology | Admitting: Obstetrics and Gynecology

## 2021-01-02 ENCOUNTER — Encounter (HOSPITAL_COMMUNITY): Payer: Self-pay | Admitting: Obstetrics and Gynecology

## 2021-01-02 DIAGNOSIS — O99333 Smoking (tobacco) complicating pregnancy, third trimester: Secondary | ICD-10-CM | POA: Diagnosis not present

## 2021-01-02 DIAGNOSIS — O99891 Other specified diseases and conditions complicating pregnancy: Secondary | ICD-10-CM | POA: Diagnosis not present

## 2021-01-02 DIAGNOSIS — F17210 Nicotine dependence, cigarettes, uncomplicated: Secondary | ICD-10-CM | POA: Insufficient documentation

## 2021-01-02 DIAGNOSIS — Z3A31 31 weeks gestation of pregnancy: Secondary | ICD-10-CM | POA: Diagnosis not present

## 2021-01-02 DIAGNOSIS — R109 Unspecified abdominal pain: Secondary | ICD-10-CM

## 2021-01-02 DIAGNOSIS — R1030 Lower abdominal pain, unspecified: Secondary | ICD-10-CM | POA: Diagnosis present

## 2021-01-02 DIAGNOSIS — B9689 Other specified bacterial agents as the cause of diseases classified elsewhere: Secondary | ICD-10-CM | POA: Diagnosis not present

## 2021-01-02 DIAGNOSIS — N760 Acute vaginitis: Secondary | ICD-10-CM | POA: Diagnosis not present

## 2021-01-02 DIAGNOSIS — O23593 Infection of other part of genital tract in pregnancy, third trimester: Secondary | ICD-10-CM | POA: Diagnosis not present

## 2021-01-02 DIAGNOSIS — O26899 Other specified pregnancy related conditions, unspecified trimester: Secondary | ICD-10-CM

## 2021-01-02 LAB — URINALYSIS, ROUTINE W REFLEX MICROSCOPIC
Bilirubin Urine: NEGATIVE
Glucose, UA: NEGATIVE mg/dL
Hgb urine dipstick: NEGATIVE
Ketones, ur: NEGATIVE mg/dL
Nitrite: NEGATIVE
Protein, ur: NEGATIVE mg/dL
Specific Gravity, Urine: <1.005 — ABNORMAL LOW (ref 1.005–1.030)
pH: 6 (ref 5.0–8.0)

## 2021-01-02 LAB — URINALYSIS, MICROSCOPIC (REFLEX): Bacteria, UA: NONE SEEN

## 2021-01-02 LAB — WET PREP, GENITAL
Sperm: NONE SEEN
Trich, Wet Prep: NONE SEEN
Yeast Wet Prep HPF POC: NONE SEEN

## 2021-01-02 MED ORDER — METRONIDAZOLE 500 MG PO TABS: 500.0000 mg | ORAL_TABLET | Freq: Two times a day (BID) | ORAL | 0 refills | Status: DC

## 2021-01-02 NOTE — MAU Note (Addendum)
Pt reports abdominal cramping for 2 weeks that has gotten worse today. She also reports abdominal pressure that began yesterday. Denies LOF or VB, only has clear or white discharge. States the pain feels like contractions. +FM

## 2021-01-02 NOTE — MAU Provider Note (Signed)
History     CSN: [id number]  Arrival date and time: 01/02/21 1257   Event Date/Time   First Provider Initiated Contact with Patient 01/02/21 1345      Chief Complaint  Patient presents with   Abdominal Cramping   Abdominal Pain   Christina Warner is a 25 y.o. year old G72P2002 female at [redacted]w[redacted]d weeks gestation who presents to MAU reporting lower abdominal/pelvic cramping for 2 weeks; worse today. She reports she started having pelvic pressure yesterday 01/01/21. She says the cramping/pain "feels like contractions." She describes the pain as intermittent. She reports having a clear to white vaginal d/c. She denies vaginal odor, VB or LOF. She reports (+) FM. She was seen yesterday (01/01/21) at [facility] for this same complaint. She did not have a pelvic exam or cervical check. She was told if the pain persisted, to come to MAU for evaluation.   OB History     Gravida  3   Para  2   Term  2   Preterm      AB      Living  2      SAB      IAB      Ectopic      Multiple  0   Live Births  2           Past Medical History:  Diagnosis Date   ADHD (attention deficit hyperactivity disorder)    Asthma    Depression     Past Surgical History:  Procedure Laterality Date   TYMPANOSTOMY TUBE PLACEMENT     WISDOM TOOTH EXTRACTION      Family History  Problem Relation Age of Onset   Depression Mother    Anxiety disorder Mother    Depression Sister    Breast cancer Neg Hx    Ovarian cancer Neg Hx    Colon cancer Neg Hx     Social History   Tobacco Use   Smoking status: Every Day    Packs/day: 1.00    Types: Cigarettes    Start date: 04/15/2011   Smokeless tobacco: Never   Tobacco comments:    Quit while pregnant  Vaping Use   Vaping Use: Never used  Substance Use Topics   Alcohol use: Yes    Alcohol/week: 5.0 standard drinks    Types: 5 Shots of liquor per week   Drug use: Not Currently    Frequency: 1.0 times per week    Types: Marijuana     Allergies: No Known Allergies  No medications prior to admission.    Review of Systems  Constitutional: Negative.   HENT: Negative.    Eyes: Negative.   Respiratory: Negative.    Cardiovascular: Negative.   Gastrointestinal: Negative.   Endocrine: Negative.   Genitourinary:  Positive for pelvic pain (cramping pain and increased pressure) and vaginal discharge (clear to white).  Musculoskeletal: Negative.   Skin: Negative.   Allergic/Immunologic: Negative.   Neurological: Negative.   Hematological: Negative.   Psychiatric/Behavioral: Negative.    Physical Exam   Blood pressure 106/61, pulse 87, temperature 98.3 F (36.8 C), temperature source Oral, resp. rate 16, height 5\' 1"  (1.549 m), weight 79 kg, last menstrual period 05/26/2020, SpO2 100 %.  Physical Exam Vitals and nursing note reviewed. Exam conducted with a chaperone present.  Constitutional:      Appearance: Normal appearance. She is normal weight.  Genitourinary:    General: Normal vulva.     Comments:  Pelvic exam: External genitalia normal, SE: vaginal walls pink and well rugated, cervix is smooth, pink, no lesions, moderate amt of bubbly, white and clear mucous vaginal d/c -- WP, GC/CT done, Cx: xt os= 1 cm, Int os= closed/long/soft, Uterus is non-tender, S=D, no CMT or friability, no adnexal tenderness.  Musculoskeletal:        General: Normal range of motion.  Skin:    General: Skin is warm and dry.  Neurological:     Mental Status: She is alert and oriented to person, place, and time.  Psychiatric:        Mood and Affect: Mood normal.        Behavior: Behavior normal.        Thought Content: Thought content normal.        Judgment: Judgment normal.   REACTIVE NST - FHR: 150 bpm / moderate variability / accels present / decels absent / TOCO: none   MAU Course  Procedures  MDM CCUA Wet Prep GC/CT -- Results pending   Results for orders placed or performed during the hospital encounter of  01/02/21 (from the past 24 hour(s))  Urinalysis, Routine w reflex microscopic Urine, Clean Catch     Status: Abnormal   Collection Time: 01/02/21  1:37 PM  Result Value Ref Range   Color, Urine YELLOW YELLOW   APPearance CLEAR CLEAR   Specific Gravity, Urine <1.005 (L) 1.005 - 1.030   pH 6.0 5.0 - 8.0   Glucose, UA NEGATIVE NEGATIVE mg/dL   Hgb urine dipstick NEGATIVE NEGATIVE   Bilirubin Urine NEGATIVE NEGATIVE   Ketones, ur NEGATIVE NEGATIVE mg/dL   Protein, ur NEGATIVE NEGATIVE mg/dL   Nitrite NEGATIVE NEGATIVE   Leukocytes,Ua MODERATE (A) NEGATIVE  Urinalysis, Microscopic (reflex)     Status: None   Collection Time: 01/02/21  1:37 PM  Result Value Ref Range   RBC / HPF 0-5 0 - 5 RBC/hpf   WBC, UA 11-20 0 - 5 WBC/hpf   Bacteria, UA NONE SEEN NONE SEEN   Squamous Epithelial / LPF 6-10 0 - 5   Mucus PRESENT   Wet prep, genital     Status: Abnormal   Collection Time: 01/02/21  2:06 PM   Specimen: PATH Cytology Cervicovaginal Ancillary Only  Result Value Ref Range   Yeast Wet Prep HPF POC NONE SEEN NONE SEEN   Trich, Wet Prep NONE SEEN NONE SEEN   Clue Cells Wet Prep HPF POC PRESENT (A) NONE SEEN   WBC, Wet Prep HPF POC MANY (A) NONE SEEN   Sperm NONE SEEN     Assessment and Plan  Bacterial vaginosis - Information provided on BV - Rx for Flagyl 500 mg BID x 7 days   Abdominal cramping affecting pregnancy, antepartum - Information provided on abdominal pain in pregnancy - Advised cramping is more than likely from BV - Safe to take Tylenol 1000 mg every 8 hours prn pain   [redacted] weeks gestation of pregnancy   - Discharge patient - Keep scheduled appt with [facility] on 01/18/2021 - Patient verbalized an understanding of the plan of care and agrees.   Raelyn Mora, CNM 01/02/2021, 1:45 PM

## 2021-01-03 LAB — GC/CHLAMYDIA PROBE AMP (~~LOC~~) NOT AT ARMC
Chlamydia: NEGATIVE
Comment: NEGATIVE
Comment: NORMAL
Neisseria Gonorrhea: NEGATIVE

## 2021-01-15 ENCOUNTER — Other Ambulatory Visit: Payer: Self-pay

## 2021-01-15 ENCOUNTER — Inpatient Hospital Stay (HOSPITAL_COMMUNITY): Payer: Medicaid Other

## 2021-01-15 ENCOUNTER — Encounter (HOSPITAL_COMMUNITY): Payer: Self-pay | Admitting: Obstetrics and Gynecology

## 2021-01-15 ENCOUNTER — Inpatient Hospital Stay (HOSPITAL_COMMUNITY)
Admission: AD | Admit: 2021-01-15 | Discharge: 2021-01-15 | Disposition: A | Discharge status: Home / Self Care | Payer: Medicaid Other | Attending: Obstetrics and Gynecology | Admitting: Obstetrics and Gynecology

## 2021-01-15 DIAGNOSIS — Z20822 Contact with and (suspected) exposure to covid-19: Secondary | ICD-10-CM | POA: Insufficient documentation

## 2021-01-15 DIAGNOSIS — F17210 Nicotine dependence, cigarettes, uncomplicated: Secondary | ICD-10-CM | POA: Diagnosis not present

## 2021-01-15 DIAGNOSIS — O99513 Diseases of the respiratory system complicating pregnancy, third trimester: Secondary | ICD-10-CM | POA: Diagnosis not present

## 2021-01-15 DIAGNOSIS — R058 Other specified cough: Secondary | ICD-10-CM | POA: Diagnosis present

## 2021-01-15 DIAGNOSIS — Z3689 Encounter for other specified antenatal screening: Secondary | ICD-10-CM | POA: Diagnosis not present

## 2021-01-15 DIAGNOSIS — O99333 Smoking (tobacco) complicating pregnancy, third trimester: Secondary | ICD-10-CM | POA: Insufficient documentation

## 2021-01-15 DIAGNOSIS — Z3A33 33 weeks gestation of pregnancy: Secondary | ICD-10-CM | POA: Diagnosis not present

## 2021-01-15 DIAGNOSIS — J069 Acute upper respiratory infection, unspecified: Secondary | ICD-10-CM | POA: Diagnosis not present

## 2021-01-15 LAB — RESP PANEL BY RT-PCR (FLU A&B, COVID) ARPGX2
Influenza A by PCR: NEGATIVE
Influenza B by PCR: NEGATIVE
SARS Coronavirus 2 by RT PCR: NEGATIVE

## 2021-01-15 LAB — URINALYSIS, ROUTINE W REFLEX MICROSCOPIC
Bilirubin Urine: NEGATIVE
Glucose, UA: NEGATIVE mg/dL
Hgb urine dipstick: NEGATIVE
Ketones, ur: NEGATIVE mg/dL
Nitrite: NEGATIVE
Protein, ur: NEGATIVE mg/dL
Specific Gravity, Urine: 1.012 (ref 1.005–1.030)
pH: 6 (ref 5.0–8.0)

## 2021-01-15 NOTE — Discharge Instructions (Signed)

## 2021-01-15 NOTE — MAU Note (Signed)
Pt reports a cough that started yesterday and difficulty breathing that also started yesterday.   Pt reports pain in her abdomen when coughing and pain in chest when she takes a deep breath.  Denies vaginal bleeding, Denies LOF.

## 2021-01-15 NOTE — MAU Provider Note (Signed)
History     CSN: [id number]  Arrival date and time: 01/15/21 1800   Event Date/Time   First Provider Initiated Contact with Patient 01/15/21 1845      Chief Complaint  Patient presents with   Respiratory Distress   25 y.o. [id number] @33 .3 wks presenting with cough and SOB. Reports onset of sx yesterday. Cough is productive with yellow/green sputum. Reports her children and sister have been sick. Denies Covid exposure. Denies fevers or body aches. Reports right upper abdominal pain when she coughs. Denies pregnancy c/o. No VB, LOF, or ctx. Reports good FM.    OB History     Gravida  3   Para  2   Term  2   Preterm      AB      Living  2      SAB      IAB      Ectopic      Multiple  0   Live Births  2           Past Medical History:  Diagnosis Date   ADHD (attention deficit hyperactivity disorder)    Asthma    Depression     Past Surgical History:  Procedure Laterality Date   TYMPANOSTOMY TUBE PLACEMENT     WISDOM TOOTH EXTRACTION      Family History  Problem Relation Age of Onset   Depression Mother    Anxiety disorder Mother    Depression Sister    Breast cancer Neg Hx    Ovarian cancer Neg Hx    Colon cancer Neg Hx     Social History   Tobacco Use   Smoking status: Every Day    Packs/day: 1.00    Types: Cigarettes    Start date: 04/15/2011   Smokeless tobacco: Never   Tobacco comments:    Quit while pregnant  Vaping Use   Vaping Use: Never used  Substance Use Topics   Alcohol use: Not Currently    Alcohol/week: 5.0 standard drinks    Types: 5 Shots of liquor per week   Drug use: Not Currently    Frequency: 1.0 times per week    Types: Marijuana    Allergies: No Known Allergies  Medications Prior to Admission  Medication Sig Dispense Refill Last Dose   Doxylamine-Pyridoxine (DICLEGIS) 10-10 MG TBEC Take 1 tablet by mouth as needed (for nausea.). 30 tablet 0 01/14/2021   acetaminophen (TYLENOL) 500 MG tablet Take 500 mg by  mouth every 6 (six) hours as needed for moderate pain.      albuterol (VENTOLIN HFA) 108 (90 Base) MCG/ACT inhaler Inhale 1-2 puffs into the lungs every 6 (six) hours as needed for wheezing or shortness of breath. 1 each 0 More than a month   escitalopram (LEXAPRO) 10 MG tablet TAKE 1 TABLET BY MOUTH EVERY DAY 90 tablet 0    metroNIDAZOLE (FLAGYL) 500 MG tablet Take 1 tablet (500 mg total) by mouth 2 (two) times daily. 14 tablet 0    Prenatal Vit-Fe Fumarate-FA (MULTIVITAMIN-PRENATAL) 27-0.8 MG TABS tablet Take 1 tablet by mouth daily at 12 noon.       Review of Systems  Constitutional:  Negative for chills and fever.  HENT:  Positive for congestion, ear pain (right) and rhinorrhea. Negative for sore throat.   Respiratory:  Positive for cough and shortness of breath.   Cardiovascular:  Negative for chest pain.  Gastrointestinal:  Negative for abdominal pain.  Genitourinary:  Negative for vaginal  bleeding and vaginal discharge.  Musculoskeletal:  Negative for arthralgias.  Physical Exam   Temperature 98.6 F (37 C), resp. rate 20, last menstrual period 05/26/2020, SpO2 100 %.  Physical Exam Vitals and nursing note reviewed.  Constitutional:      General: She is not in acute distress.    Appearance: Normal appearance. She is not ill-appearing.  HENT:     Head: Normocephalic and atraumatic.     Right Ear: Tympanic membrane, ear canal and external ear normal.     Left Ear: Tympanic membrane, ear canal and external ear normal.     Nose:     Right Sinus: No maxillary sinus tenderness or frontal sinus tenderness.     Left Sinus: Maxillary sinus tenderness present. No frontal sinus tenderness.     Mouth/Throat:     Mouth: Mucous membranes are moist.     Palate: No lesions.     Pharynx: Oropharynx is clear. Uvula midline. No posterior oropharyngeal erythema.  Cardiovascular:     Rate and Rhythm: Regular rhythm. Tachycardia present.     Heart sounds: Normal heart sounds.  Pulmonary:      Effort: Pulmonary effort is normal. No respiratory distress.     Breath sounds: Normal breath sounds. No stridor. No wheezing, rhonchi or rales.  Abdominal:     Palpations: Abdomen is soft.     Tenderness: There is no abdominal tenderness.  Musculoskeletal:        General: Normal range of motion.     Cervical back: Normal range of motion.  Skin:    General: Skin is warm and dry.  Neurological:     General: No focal deficit present.     Mental Status: She is alert and oriented to person, place, and time.  Psychiatric:        Mood and Affect: Mood normal.        Behavior: Behavior normal.  EFM: 140 bpm, mod variability, + accels, no decels Toco: none  Results for orders placed or performed during the hospital encounter of 01/15/21 (from the past 24 hour(s))  Resp Panel by RT-PCR (Flu A&B, Covid) Nasopharyngeal Swab     Status: None   Collection Time: 01/15/21  6:24 PM   Specimen: Nasopharyngeal Swab; Nasopharyngeal(NP) swabs in vial transport medium  Result Value Ref Range   SARS Coronavirus 2 by RT PCR NEGATIVE NEGATIVE   Influenza A by PCR NEGATIVE NEGATIVE   Influenza B by PCR NEGATIVE NEGATIVE  Urinalysis, Routine w reflex microscopic Urine, Clean Catch     Status: Abnormal   Collection Time: 01/15/21  6:46 PM  Result Value Ref Range   Color, Urine YELLOW YELLOW   APPearance CLEAR CLEAR   Specific Gravity, Urine 1.012 1.005 - 1.030   pH 6.0 5.0 - 8.0   Glucose, UA NEGATIVE NEGATIVE mg/dL   Hgb urine dipstick NEGATIVE NEGATIVE   Bilirubin Urine NEGATIVE NEGATIVE   Ketones, ur NEGATIVE NEGATIVE mg/dL   Protein, ur NEGATIVE NEGATIVE mg/dL   Nitrite NEGATIVE NEGATIVE   Leukocytes,Ua TRACE (A) NEGATIVE   RBC / HPF 0-5 0 - 5 RBC/hpf   WBC, UA 0-5 0 - 5 WBC/hpf   Bacteria, UA RARE (A) NONE SEEN   Squamous Epithelial / LPF 0-5 0 - 5   Mucus PRESENT     DG Chest Port 1 View  Result Date: 01/15/2021 CLINICAL DATA:  Shortness of breath and productive cough. EXAM: PORTABLE  CHEST 1 VIEW COMPARISON:  Chest x-ray dated January 07, 2020.  FINDINGS: The heart size and mediastinal contours are within normal limits. Both lungs are clear. The visualized skeletal structures are unremarkable. IMPRESSION: No active disease. Electronically Signed   By: Obie Dredge M.D.   On: 01/15/2021 19:37    MAU Course  Procedures  MDM Labs and imaging ordered and reviewed. Likely viral URI vs sinusitis. Discussed supportive measures, med list provided. If not improving 1 week after sx onset consider abx for sinusitis. Stable for discharge home.   Assessment and Plan   1. [redacted] weeks gestation of pregnancy   2. NST (non-stress test) reactive   3. Upper respiratory infection, viral    Discharge home Follow up at [facility] in 3 days Return to ED for worsening SOB or CP Return to MAU for OB emergencies  Allergies as of 01/15/2021   No Known Allergies      Medication List     STOP taking these medications    escitalopram 10 MG tablet Commonly known as: LEXAPRO       TAKE these medications    acetaminophen 500 MG tablet Commonly known as: TYLENOL Take 500 mg by mouth every 6 (six) hours as needed for moderate pain.   albuterol 108 (90 Base) MCG/ACT inhaler Commonly known as: VENTOLIN HFA Inhale 1-2 puffs into the lungs every 6 (six) hours as needed for wheezing or shortness of breath.   Doxylamine-Pyridoxine 10-10 MG Tbec Commonly known as: Diclegis Take 1 tablet by mouth as needed (for nausea.).   metroNIDAZOLE 500 MG tablet Commonly known as: FLAGYL Take 1 tablet (500 mg total) by mouth 2 (two) times daily.   multivitamin-prenatal 27-0.8 MG Tabs tablet Take 1 tablet by mouth daily at 12 noon.        Donette Larry, CNM 01/15/2021, 7:50 PM

## 2021-01-29 ENCOUNTER — Inpatient Hospital Stay (HOSPITAL_COMMUNITY)
Admission: AD | Admit: 2021-01-29 | Discharge: 2021-01-29 | Disposition: A | Discharge status: Home / Self Care | Payer: Medicaid Other | Attending: Obstetrics and Gynecology | Admitting: Obstetrics and Gynecology

## 2021-01-29 ENCOUNTER — Encounter (HOSPITAL_COMMUNITY): Payer: Self-pay | Admitting: Obstetrics and Gynecology

## 2021-01-29 ENCOUNTER — Other Ambulatory Visit: Payer: Self-pay

## 2021-01-29 DIAGNOSIS — Z0371 Encounter for suspected problem with amniotic cavity and membrane ruled out: Secondary | ICD-10-CM | POA: Diagnosis not present

## 2021-01-29 DIAGNOSIS — M549 Dorsalgia, unspecified: Secondary | ICD-10-CM | POA: Diagnosis present

## 2021-01-29 DIAGNOSIS — O98813 Other maternal infectious and parasitic diseases complicating pregnancy, third trimester: Secondary | ICD-10-CM | POA: Diagnosis not present

## 2021-01-29 DIAGNOSIS — B3731 Acute candidiasis of vulva and vagina: Secondary | ICD-10-CM

## 2021-01-29 DIAGNOSIS — O26893 Other specified pregnancy related conditions, third trimester: Secondary | ICD-10-CM | POA: Insufficient documentation

## 2021-01-29 DIAGNOSIS — N898 Other specified noninflammatory disorders of vagina: Secondary | ICD-10-CM | POA: Diagnosis not present

## 2021-01-29 DIAGNOSIS — Z3A35 35 weeks gestation of pregnancy: Secondary | ICD-10-CM | POA: Insufficient documentation

## 2021-01-29 LAB — POCT FERN TEST: POCT Fern Test: NEGATIVE

## 2021-01-29 LAB — URINALYSIS, ROUTINE W REFLEX MICROSCOPIC
Bacteria, UA: NONE SEEN
Bilirubin Urine: NEGATIVE
Glucose, UA: NEGATIVE mg/dL
Hgb urine dipstick: NEGATIVE
Ketones, ur: NEGATIVE mg/dL
Nitrite: NEGATIVE
Protein, ur: NEGATIVE mg/dL
Specific Gravity, Urine: 1.011 (ref 1.005–1.030)
pH: 6 (ref 5.0–8.0)

## 2021-01-29 MED ORDER — TERCONAZOLE 0.4 % VA CREA: 1.0000 | TOPICAL_CREAM | Freq: Every day | VAGINAL | 0 refills | Status: DC

## 2021-01-29 NOTE — MAU Note (Signed)
Been having a clear watery d/c since 10 last night.  Didn't have any pads, so just keeps changing her underwear. Started having low back pain around 0800 this morning, will lighten up, but always some discomfort. No bleeding, baby is moving, but not as much as she normally does.

## 2021-01-29 NOTE — MAU Provider Note (Signed)
History     CSN: [id number]  Arrival date and time: 01/29/21 1427   Event Date/Time   First Provider Initiated Contact with Patient 01/29/21 1522      Chief Complaint  Patient presents with   Rupture of Membranes   Vaginal Discharge   Back Pain   HPI Christina Warner is a 25 y.o. G3P2002 at [redacted]w[redacted]d who presents with back pain & possible rupture of membranes. States since last night she had noticed a clear watery discharge that has caused her to change her underwear. No odor. Endorses vaginal irritation for the last few days. Also reports intermittent low back pain that occurs about 5-6 times per hour. Denies vaginal bleeding.Some decreased movement earlier today but reports good movement now. Sees her OB on Friday.   OB History     Gravida  3   Para  2   Term  2   Preterm      AB      Living  2      SAB      IAB      Ectopic      Multiple  0   Live Births  2           Past Medical History:  Diagnosis Date   ADHD (attention deficit hyperactivity disorder)    Asthma    Depression     Past Surgical History:  Procedure Laterality Date   TYMPANOSTOMY TUBE PLACEMENT     WISDOM TOOTH EXTRACTION      Family History  Problem Relation Age of Onset   Depression Mother    Anxiety disorder Mother    Depression Sister    Breast cancer Neg Hx    Ovarian cancer Neg Hx    Colon cancer Neg Hx     Social History   Tobacco Use   Smoking status: Every Day    Packs/day: 1.00    Types: Cigarettes    Start date: 04/15/2011   Smokeless tobacco: Never   Tobacco comments:    Quit while pregnant  Vaping Use   Vaping Use: Never used  Substance Use Topics   Alcohol use: Not Currently    Alcohol/week: 5.0 standard drinks    Types: 5 Shots of liquor per week   Drug use: Not Currently    Frequency: 1.0 times per week    Types: Marijuana    Allergies: No Known Allergies  Medications Prior to Admission  Medication Sig Dispense Refill Last Dose    Doxylamine-Pyridoxine (DICLEGIS) 10-10 MG TBEC Take 1 tablet by mouth as needed (for nausea.). 30 tablet 0 01/29/2021   acetaminophen (TYLENOL) 500 MG tablet Take 500 mg by mouth every 6 (six) hours as needed for moderate pain.      albuterol (VENTOLIN HFA) 108 (90 Base) MCG/ACT inhaler Inhale 1-2 puffs into the lungs every 6 (six) hours as needed for wheezing or shortness of breath. 1 each 0    metroNIDAZOLE (FLAGYL) 500 MG tablet Take 1 tablet (500 mg total) by mouth 2 (two) times daily. 14 tablet 0    Prenatal Vit-Fe Fumarate-FA (MULTIVITAMIN-PRENATAL) 27-0.8 MG TABS tablet Take 1 tablet by mouth daily at 12 noon.       Review of Systems  Constitutional: Negative.   Gastrointestinal: Negative.   Genitourinary:  Positive for vaginal discharge. Negative for dysuria and vaginal bleeding.  Musculoskeletal:  Positive for back pain.  Physical Exam   Blood pressure 117/68, pulse 97, temperature 98 F (36.7 C), temperature  source Oral, resp. rate 18, height 5\' 1"  (1.549 m), weight 80.8 kg, last menstrual period 05/26/2020, SpO2 99 %.  Physical Exam Vitals and nursing note reviewed. Exam conducted with a chaperone present.  Constitutional:      Appearance: Normal appearance. She is not ill-appearing.  HENT:     Head: Normocephalic and atraumatic.  Eyes:     General: No scleral icterus. Pulmonary:     Effort: Pulmonary effort is normal. No respiratory distress.  Genitourinary:    Vagina: Vaginal discharge and erythema present.     Cervix: No friability or cervical bleeding.     Comments: Moderate amount of clumpy white discharge. No pooling of fluid. No blood.   Dilation: Fingertip Effacement (%): Thick Cervical Position: Posterior Exam by:: [email or web address], NP  Skin:    General: Skin is warm and dry.  Neurological:     General: No focal deficit present.     Mental Status: She is alert.  Psychiatric:        Mood and Affect: Mood normal.        Behavior: Behavior normal.   NST:   Baseline: 140 bpm, Variability: Good {> 6 bpm), Accelerations: Reactive, and Decelerations: Absent  MAU Course  Procedures Results for orders placed or performed during the hospital encounter of 01/29/21 (from the past 24 hour(s))  Urinalysis, Routine w reflex microscopic Urine, Clean Catch     Status: Abnormal   Collection Time: 01/29/21  3:14 PM  Result Value Ref Range   Color, Urine YELLOW YELLOW   APPearance CLEAR CLEAR   Specific Gravity, Urine 1.011 1.005 - 1.030   pH 6.0 5.0 - 8.0   Glucose, UA NEGATIVE NEGATIVE mg/dL   Hgb urine dipstick NEGATIVE NEGATIVE   Bilirubin Urine NEGATIVE NEGATIVE   Ketones, ur NEGATIVE NEGATIVE mg/dL   Protein, ur NEGATIVE NEGATIVE mg/dL   Nitrite NEGATIVE NEGATIVE   Leukocytes,Ua SMALL (A) NEGATIVE   RBC / HPF 0-5 0 - 5 RBC/hpf   WBC, UA 0-5 0 - 5 WBC/hpf   Bacteria, UA NONE SEEN NONE SEEN   Squamous Epithelial / LPF 0-5 0 - 5   Mucus PRESENT   POCT fern test     Status: Normal   Collection Time: 01/29/21  4:40 PM  Result Value Ref Range   POCT Fern Test Negative = intact amniotic membranes     MDM SSE performed. No pooling of fluid. Discharge consistent with yeast infection. Will treat with terazol.  Fern negative  Reactive fetal tracing. No regular contractions. Discussed maternity support belt & tylenol for back pain.   Assessment and Plan   1. Encounter for suspected PROM, with rupture of membranes not found   2. Vaginal yeast infection   3. [redacted] weeks gestation of pregnancy    -Rx terazol -keep f/u with ob this week -get maternity support belt -reviewed reasons to return to MAU  01/31/21 01/29/2021, 3:22 PM

## 2021-02-08 LAB — OB RESULTS CONSOLE GBS: GBS: POSITIVE

## 2021-02-26 ENCOUNTER — Other Ambulatory Visit: Payer: Self-pay | Admitting: Advanced Practice Midwife

## 2021-02-26 ENCOUNTER — Inpatient Hospital Stay (HOSPITAL_COMMUNITY)
Admission: AD | Admit: 2021-02-26 | Discharge: 2021-02-26 | Disposition: A | Discharge status: Home / Self Care | Payer: Medicaid Other | Attending: Obstetrics and Gynecology | Admitting: Obstetrics and Gynecology

## 2021-02-26 ENCOUNTER — Other Ambulatory Visit: Payer: Self-pay

## 2021-02-26 ENCOUNTER — Encounter (HOSPITAL_COMMUNITY): Payer: Self-pay | Admitting: Obstetrics and Gynecology

## 2021-02-26 DIAGNOSIS — Z0371 Encounter for suspected problem with amniotic cavity and membrane ruled out: Secondary | ICD-10-CM

## 2021-02-26 DIAGNOSIS — Z3689 Encounter for other specified antenatal screening: Secondary | ICD-10-CM

## 2021-02-26 DIAGNOSIS — Z3A39 39 weeks gestation of pregnancy: Secondary | ICD-10-CM | POA: Diagnosis not present

## 2021-02-26 DIAGNOSIS — B3731 Acute candidiasis of vulva and vagina: Secondary | ICD-10-CM

## 2021-02-26 DIAGNOSIS — O98813 Other maternal infectious and parasitic diseases complicating pregnancy, third trimester: Secondary | ICD-10-CM | POA: Diagnosis present

## 2021-02-26 HISTORY — DX: Chlamydial infection, unspecified: A74.9

## 2021-02-26 HISTORY — DX: Urinary tract infection, site not specified: N39.0

## 2021-02-26 LAB — WET PREP, GENITAL
Clue Cells Wet Prep HPF POC: NONE SEEN
Sperm: NONE SEEN
Trich, Wet Prep: NONE SEEN
Yeast Wet Prep HPF POC: NONE SEEN

## 2021-02-26 LAB — POCT FERN TEST: POCT Fern Test: NEGATIVE

## 2021-02-26 MED ORDER — TERCONAZOLE 0.4 % VA CREA: 1.0000 | TOPICAL_CREAM | Freq: Every day | VAGINAL | 0 refills | Status: DC

## 2021-02-26 NOTE — MAU Note (Signed)
Noted leaking a couple days ago, first thought it was urine, today there seemed to be a lot more. Clear. Watery, no bleeding. No pain.

## 2021-02-26 NOTE — MAU Note (Signed)
Presents stating she began leaking fluid on Sunday, reports fluid is clear.  Denies VB.  Endorses +FM.

## 2021-02-26 NOTE — MAU Provider Note (Signed)
Event Date/Time   First Provider Initiated Contact with Patient 02/26/21 1636      S: Ms. Christina Warner is a 25 y.o. [id number] at [redacted]w[redacted]d  who presents to MAU today complaining of leaking of fluid since Sunday. Patient states that her underwear will be wet during the day, but denies having to wear a pad and denies any large gushes of fluid. She denies vaginal bleeding. She denies contractions. She reports normal fetal movement.    O: BP 117/71 (BP Location: Right Arm)   Pulse (!) 106   Temp 97.8 F (36.6 C) (Oral)   Resp 18   Ht 5\' 1"  (1.549 m)   Wt 81.8 kg   LMP 05/26/2020   BMI 34.07 kg/m   Patient Vitals for the past 24 hrs:  BP Temp Temp src Pulse Resp Height Weight  02/26/21 1527 -- -- -- -- -- 5\' 1"  (1.549 m) 81.8 kg  02/26/21 1523 117/71 97.8 F (36.6 C) Oral (!) 106 18 -- --   GENERAL: Well-developed, well-nourished female in no acute distress.  HEAD: Normocephalic, atraumatic.  CHEST: Normal effort of breathing, regular heart rate ABDOMEN: Soft, nontender, gravid PELVIC: Normal external female genitalia. Vagina is pink and rugated. Cervix with normal contour, no lesions. Thick, clumped, yellow-white discharge on vaginal walls.  No pooling. Fern negative x2. WetPrep/GC/CT collected.  Cervical exam: deferred    Fetal Monitoring: reactive Baseline: 140 Variability: moderate Accelerations: present, 15x15 Decelerations: absent Contractions: irritability  No results found for this or any previous visit (from the past 24 hour(s)).   A: SIUP at [redacted]w[redacted]d  Membranes intact Vaginal yeast infection  P: Discharge to home in stable condition RX terconazole Return MAU precautions given Pt discharged to home in stable condition  Ronnel Zuercher, 02/28/21, NP 02/26/2021 4:52 PM

## 2021-02-27 LAB — GC/CHLAMYDIA PROBE AMP (~~LOC~~) NOT AT ARMC
Chlamydia: NEGATIVE
Comment: NEGATIVE
Comment: NORMAL
Neisseria Gonorrhea: NEGATIVE

## 2021-02-28 ENCOUNTER — Other Ambulatory Visit: Payer: Self-pay | Admitting: Advanced Practice Midwife

## 2021-03-02 ENCOUNTER — Inpatient Hospital Stay (HOSPITAL_COMMUNITY): Payer: Medicaid Other | Admitting: Anesthesiology

## 2021-03-02 ENCOUNTER — Inpatient Hospital Stay (HOSPITAL_COMMUNITY)
Admission: AD | Admit: 2021-03-02 | Discharge: 2021-03-03 | DRG: 807 | Disposition: A | Discharge status: Home / Self Care | Payer: Medicaid Other | Attending: Obstetrics and Gynecology | Admitting: Obstetrics and Gynecology

## 2021-03-02 ENCOUNTER — Encounter (HOSPITAL_COMMUNITY): Payer: Self-pay | Admitting: Obstetrics and Gynecology

## 2021-03-02 ENCOUNTER — Other Ambulatory Visit: Payer: Self-pay

## 2021-03-02 DIAGNOSIS — O99824 Streptococcus B carrier state complicating childbirth: Secondary | ICD-10-CM | POA: Diagnosis present

## 2021-03-02 DIAGNOSIS — O99334 Smoking (tobacco) complicating childbirth: Secondary | ICD-10-CM | POA: Diagnosis present

## 2021-03-02 DIAGNOSIS — O99214 Obesity complicating childbirth: Secondary | ICD-10-CM | POA: Diagnosis present

## 2021-03-02 DIAGNOSIS — Z3A40 40 weeks gestation of pregnancy: Secondary | ICD-10-CM | POA: Diagnosis not present

## 2021-03-02 DIAGNOSIS — F17210 Nicotine dependence, cigarettes, uncomplicated: Secondary | ICD-10-CM | POA: Diagnosis present

## 2021-03-02 DIAGNOSIS — O26893 Other specified pregnancy related conditions, third trimester: Secondary | ICD-10-CM | POA: Diagnosis present

## 2021-03-02 DIAGNOSIS — O479 False labor, unspecified: Secondary | ICD-10-CM | POA: Diagnosis present

## 2021-03-02 DIAGNOSIS — Z20822 Contact with and (suspected) exposure to covid-19: Secondary | ICD-10-CM | POA: Diagnosis present

## 2021-03-02 LAB — CBC
HCT: 36.3 % (ref 36.0–46.0)
Hemoglobin: 11.9 g/dL — ABNORMAL LOW (ref 12.0–15.0)
MCH: 31.2 pg (ref 26.0–34.0)
MCHC: 32.8 g/dL (ref 30.0–36.0)
MCV: 95 fL (ref 80.0–100.0)
Platelets: 263 10*3/uL (ref 150–400)
RBC: 3.82 MIL/uL — ABNORMAL LOW (ref 3.87–5.11)
RDW: 13.2 % (ref 11.5–15.5)
WBC: 14.8 10*3/uL — ABNORMAL HIGH (ref 4.0–10.5)
nRBC: 0 % (ref 0.0–0.2)

## 2021-03-02 LAB — POCT FERN TEST: POCT Fern Test: POSITIVE

## 2021-03-02 LAB — TYPE AND SCREEN
ABO/RH(D): O POS
Antibody Screen: NEGATIVE

## 2021-03-02 LAB — RPR: RPR Ser Ql: NONREACTIVE

## 2021-03-02 LAB — RESP PANEL BY RT-PCR (FLU A&B, COVID) ARPGX2
Influenza A by PCR: NEGATIVE
Influenza B by PCR: NEGATIVE
SARS Coronavirus 2 by RT PCR: NEGATIVE

## 2021-03-02 MED ORDER — DIPHENHYDRAMINE HCL 50 MG/ML IJ SOLN: 12.5000 mg | INTRAMUSCULAR | Status: DC | PRN

## 2021-03-02 MED ORDER — FENTANYL-BUPIVACAINE-NACL 0.5-0.125-0.9 MG/250ML-% EP SOLN
12.0000 mL/h | EPIDURAL | Status: DC | PRN
  Administered 2021-03-02: 12 mL/h via EPIDURAL

## 2021-03-02 MED ORDER — ACETAMINOPHEN 325 MG PO TABS
650.0000 mg | ORAL_TABLET | ORAL | Status: DC | PRN
  Administered 2021-03-02 – 2021-03-03 (×5): 650 mg via ORAL
  Filled 2021-03-02 (×5): qty 2

## 2021-03-02 MED ORDER — ONDANSETRON HCL 4 MG/2ML IJ SOLN: 4.0000 mg | INTRAMUSCULAR | Status: DC | PRN

## 2021-03-02 MED ORDER — FENTANYL-BUPIVACAINE-NACL 0.5-0.125-0.9 MG/250ML-% EP SOLN
EPIDURAL | Status: AC
  Filled 2021-03-02: qty 250

## 2021-03-02 MED ORDER — PENICILLIN G POT IN DEXTROSE 60000 UNIT/ML IV SOLN: 3.0000 10*6.[IU] | INTRAVENOUS | Status: DC

## 2021-03-02 MED ORDER — SOD CITRATE-CITRIC ACID 500-334 MG/5ML PO SOLN: 30.0000 mL | ORAL | Status: DC | PRN

## 2021-03-02 MED ORDER — WITCH HAZEL-GLYCERIN EX PADS: 1.0000 "application " | MEDICATED_PAD | CUTANEOUS | Status: DC | PRN

## 2021-03-02 MED ORDER — LIDOCAINE HCL (PF) 1 % IJ SOLN
INTRAMUSCULAR | Status: DC | PRN
  Administered 2021-03-02: 6 mL via EPIDURAL

## 2021-03-02 MED ORDER — ACETAMINOPHEN 325 MG PO TABS
650.0000 mg | ORAL_TABLET | ORAL | Status: DC | PRN
  Administered 2021-03-02: 650 mg via ORAL
  Filled 2021-03-02: qty 2

## 2021-03-02 MED ORDER — LACTATED RINGERS IV SOLN: 500.0000 mL | INTRAVENOUS | Status: DC | PRN

## 2021-03-02 MED ORDER — OXYTOCIN BOLUS FROM INFUSION
333.0000 mL | Freq: Once | INTRAVENOUS | Status: AC
  Administered 2021-03-02: 333 mL via INTRAVENOUS

## 2021-03-02 MED ORDER — ZOLPIDEM TARTRATE 5 MG PO TABS: 5.0000 mg | ORAL_TABLET | Freq: Every evening | ORAL | Status: DC | PRN

## 2021-03-02 MED ORDER — PHENYLEPHRINE 40 MCG/ML (10ML) SYRINGE FOR IV PUSH (FOR BLOOD PRESSURE SUPPORT): 80.0000 ug | PREFILLED_SYRINGE | INTRAVENOUS | Status: DC | PRN

## 2021-03-02 MED ORDER — BENZOCAINE-MENTHOL 20-0.5 % EX AERO
1.0000 "application " | INHALATION_SPRAY | CUTANEOUS | Status: DC | PRN
  Administered 2021-03-02: 1 via TOPICAL
  Filled 2021-03-02: qty 56

## 2021-03-02 MED ORDER — IBUPROFEN 600 MG PO TABS
600.0000 mg | ORAL_TABLET | Freq: Four times a day (QID) | ORAL | Status: DC
  Administered 2021-03-02 – 2021-03-03 (×5): 600 mg via ORAL
  Filled 2021-03-02 (×6): qty 1

## 2021-03-02 MED ORDER — DIBUCAINE (PERIANAL) 1 % EX OINT: 1.0000 "application " | TOPICAL_OINTMENT | CUTANEOUS | Status: DC | PRN

## 2021-03-02 MED ORDER — OXYCODONE-ACETAMINOPHEN 5-325 MG PO TABS
1.0000 | ORAL_TABLET | ORAL | Status: DC | PRN
Start: 2021-03-02 — End: 2021-03-02

## 2021-03-02 MED ORDER — SODIUM CHLORIDE 0.9 % IV SOLN
5.0000 10*6.[IU] | Freq: Once | INTRAVENOUS | Status: AC
  Administered 2021-03-02: 5 10*6.[IU] via INTRAVENOUS
  Filled 2021-03-02: qty 5

## 2021-03-02 MED ORDER — FENTANYL CITRATE (PF) 100 MCG/2ML IJ SOLN: 50.0000 ug | INTRAMUSCULAR | Status: DC | PRN

## 2021-03-02 MED ORDER — TETANUS-DIPHTH-ACELL PERTUSSIS 5-2.5-18.5 LF-MCG/0.5 IM SUSY: 0.5000 mL | PREFILLED_SYRINGE | Freq: Once | INTRAMUSCULAR | Status: DC

## 2021-03-02 MED ORDER — PRENATAL MULTIVITAMIN CH
1.0000 | ORAL_TABLET | Freq: Every day | ORAL | Status: DC
  Administered 2021-03-02 – 2021-03-03 (×2): 1 via ORAL
  Filled 2021-03-02 (×2): qty 1

## 2021-03-02 MED ORDER — OXYTOCIN-SODIUM CHLORIDE 30-0.9 UT/500ML-% IV SOLN
2.5000 [IU]/h | INTRAVENOUS | Status: DC
  Filled 2021-03-02: qty 500

## 2021-03-02 MED ORDER — LIDOCAINE HCL (PF) 1 % IJ SOLN: 30.0000 mL | INTRAMUSCULAR | Status: DC | PRN

## 2021-03-02 MED ORDER — DIPHENHYDRAMINE HCL 25 MG PO CAPS: 25.0000 mg | ORAL_CAPSULE | Freq: Four times a day (QID) | ORAL | Status: DC | PRN

## 2021-03-02 MED ORDER — ONDANSETRON HCL 4 MG/2ML IJ SOLN: 4.0000 mg | Freq: Four times a day (QID) | INTRAMUSCULAR | Status: DC | PRN

## 2021-03-02 MED ORDER — EPHEDRINE 5 MG/ML INJ: 10.0000 mg | INTRAVENOUS | Status: DC | PRN

## 2021-03-02 MED ORDER — ONDANSETRON HCL 4 MG PO TABS: 4.0000 mg | ORAL_TABLET | ORAL | Status: DC | PRN

## 2021-03-02 MED ORDER — OXYCODONE-ACETAMINOPHEN 5-325 MG PO TABS: 2.0000 | ORAL_TABLET | ORAL | Status: DC | PRN

## 2021-03-02 MED ORDER — LACTATED RINGERS IV SOLN: 500.0000 mL | Freq: Once | INTRAVENOUS | Status: DC

## 2021-03-02 MED ORDER — SENNOSIDES-DOCUSATE SODIUM 8.6-50 MG PO TABS
2.0000 | ORAL_TABLET | ORAL | Status: DC
  Administered 2021-03-02 – 2021-03-03 (×2): 2 via ORAL
  Filled 2021-03-02 (×2): qty 2

## 2021-03-02 MED ORDER — OXYCODONE HCL 5 MG PO TABS
5.0000 mg | ORAL_TABLET | Freq: Once | ORAL | Status: AC
  Administered 2021-03-02: 5 mg via ORAL
  Filled 2021-03-02: qty 1

## 2021-03-02 MED ORDER — COCONUT OIL OIL: 1.0000 "application " | TOPICAL_OIL | Status: DC | PRN

## 2021-03-02 MED ORDER — FENTANYL CITRATE (PF) 100 MCG/2ML IJ SOLN
INTRAMUSCULAR | Status: AC
  Administered 2021-03-02: 50 ug via INTRAVENOUS
  Filled 2021-03-02: qty 2

## 2021-03-02 MED ORDER — SIMETHICONE 80 MG PO CHEW: 80.0000 mg | CHEWABLE_TABLET | ORAL | Status: DC | PRN

## 2021-03-02 MED ORDER — LACTATED RINGERS IV SOLN: INTRAVENOUS | Status: DC

## 2021-03-02 MED ORDER — ALBUTEROL SULFATE HFA 108 (90 BASE) MCG/ACT IN AERS
1.0000 | INHALATION_SPRAY | Freq: Four times a day (QID) | RESPIRATORY_TRACT | Status: DC | PRN
Start: 2021-03-02 — End: 2021-03-03

## 2021-03-02 NOTE — Progress Notes (Signed)
MOB was referred for history of depression/anxiety. * Referral screened out by Clinical Social Worker because none of the following criteria appear to apply: ~ History of anxiety/depression during this pregnancy, or of post-partum depression following prior delivery. ~ Diagnosis of anxiety and/or depression within last 3 years. No concerns noted in OB records. OR * MOB's symptoms currently being treated with medication and/or therapy.  Please contact the Clinical Social Worker if needs arise, by MOB request, or if MOB scores greater than 9/yes to question 10 on Edinburgh Postpartum Depression Screen.   Louine Tenpenny Boyd-Gilyard, MSW, LCSW Clinical Social Work [phone]  

## 2021-03-02 NOTE — Anesthesia Procedure Notes (Signed)
Epidural Patient location during procedure: OB Start time: 03/02/2021 4:55 AM End time: 03/02/2021 5:05 AM  Staffing Anesthesiologist: Mellody Dance, MD Performed: anesthesiologist   Preanesthetic Checklist Completed: patient identified, IV checked, site marked, risks and benefits discussed, monitors and equipment checked, pre-op evaluation and timeout performed  Epidural Patient position: sitting Prep: DuraPrep Patient monitoring: heart rate, cardiac monitor, continuous pulse ox and blood pressure Approach: midline Location: L2-L3 Injection technique: LOR saline  Needle:  Needle type: Tuohy  Needle gauge: 17 G Needle length: 9 cm Needle insertion depth: 5 cm Catheter type: closed end flexible Catheter size: 20 Guage Catheter at skin depth: 10 cm Test dose: negative and Other  Assessment Events: blood not aspirated, injection not painful, no injection resistance and negative IV test  Additional Notes Informed consent obtained prior to proceeding including risk of failure, 1% risk of PDPH, risk of minor discomfort and bruising.  Discussed rare but serious complications including epidural abscess, permanent nerve injury, epidural hematoma.  Discussed alternatives to epidural analgesia and patient desires to proceed.  Timeout performed pre-procedure verifying patient name, procedure, and platelet count.  Patient tolerated procedure well.

## 2021-03-02 NOTE — Anesthesia Postprocedure Evaluation (Signed)
Anesthesia Post Note  Patient: Christina Warner  Procedure(s) Performed: AN AD HOC LABOR EPIDURAL     Patient location during evaluation: Mother Baby Anesthesia Type: Epidural Level of consciousness: awake Pain management: satisfactory to patient Vital Signs Assessment: post-procedure vital signs reviewed and stable Respiratory status: spontaneous breathing Cardiovascular status: stable Anesthetic complications: no   No notable events documented.  Last Vitals:  Vitals:   03/02/21 1116 03/02/21 1516  BP: 103/65 105/60  Pulse: 65 67  Resp: 18 18  Temp: 36.6 C 36.6 C  SpO2: 98% 98%    Last Pain:  Vitals:   03/02/21 1721  TempSrc:   PainSc: 0-No pain   Pain Goal:                   [employer]

## 2021-03-02 NOTE — H&P (Signed)
Christina Warner is a 25 y.o. female G3P2002 [redacted]w[redacted]d presenting for LOF since 0300 this AM. Complaining of contractions and requesting epidural. Normal FM. No VB.  Was 3/80/-1 in the office yesterday and membranes were swept  In MAU, ROM confirmed and exam 5/70/-1  Pregnancy c/b: Vanishing twin pregnancy: initially di-di twin pregnancy noted 4/11, by 4/28 visit only single IUP noted Obesity: initial OB BMI 32 Smoker  OB History     Gravida  3   Para  2   Term  2   Preterm      AB      Living  2      SAB      IAB      Ectopic      Multiple  0   Live Births  2          Past Medical History:  Diagnosis Date   ADHD (attention deficit hyperactivity disorder)    Asthma    Chlamydia    Depression    UTI (urinary tract infection)    Past Surgical History:  Procedure Laterality Date   TYMPANOSTOMY TUBE PLACEMENT     WISDOM TOOTH EXTRACTION     Family History: family history includes Anxiety disorder in her mother; Cancer in her mother; Depression in her mother and sister; Diabetes in her mother. Social History:  reports that she has been smoking cigarettes. She started smoking about 9 years ago. She has a 9.00 pack-year smoking history. She has never used smokeless tobacco. She reports that she does not currently use alcohol after a past usage of about 5.0 standard drinks per week. She reports that she does not currently use drugs after having used the following drugs: Marijuana. Frequency: 1.00 time per week.     Maternal Diabetes: No Genetic Screening: Declined Maternal Ultrasounds/Referrals: Normal Fetal Ultrasounds or other Referrals:  None Maternal Substance Abuse:  Yes:  Type: Smoker Significant Maternal Medications:  None Significant Maternal Lab Results:  Group B Strep positive Other Comments:  None  Review of Systems Per HPI Exam Physical Exam  Dilation: 6 Effacement (%): 70 Station: -1 Exam by:: Crystal O'rear Blood pressure 113/65, pulse 89,  temperature 97.9 F (36.6 C), temperature source Oral, resp. rate 18, last menstrual period 05/26/2020, SpO2 100 %. Gen: NAD, resting comfortably CVS: Normal pulses Lungs: nonlabored respirations Abd: Gravid abdomen Ext: no calf edema or tenderness  Fetal testing: 135bpm, mod variability, + accels, no decels Toco: ctx q 2-3 mins  Prenatal labs: ABO, Rh:  --/--/O POS (11/19 9144) Antibody: NEG (11/19 0412) Rubella:   RPR:    HBsAg:    HIV:    GBS:   positive  Assessment/Plan: 25Y G3P2002 @ [redacted]w[redacted]d, labor, SROM Fetal wellbeing: cat I tracing Labor: expectant management, anticipate SVD GBS+: penicillin Pain control: epidural in place  Charlett Nose 03/02/2021, 5:18 AM

## 2021-03-02 NOTE — MAU Note (Signed)
..  Christina Warner is a 25 y.o. at [redacted]w[redacted]d here in MAU reporting: leaking of fluid since 3am and contractions every 2 minutes. +FM

## 2021-03-02 NOTE — Anesthesia Preprocedure Evaluation (Addendum)
Anesthesia Evaluation  Patient identified by MRN, date of birth, ID band Patient awake    Reviewed: Allergy & Precautions, H&P , Patient's Chart, lab work & pertinent test results  Airway Mallampati: II  TM Distance: >3 FB Neck ROM: full    Dental  (+) Teeth Intact   Pulmonary asthma , Current Smoker,    breath sounds clear to auscultation       Cardiovascular  Rhythm:Regular Rate:Normal     Neuro/Psych PSYCHIATRIC DISORDERS Depression negative neurological ROS     GI/Hepatic Neg liver ROS, GERD  ,  Endo/Other    Renal/GU negative Renal ROS  negative genitourinary   Musculoskeletal   Abdominal   Peds negative pediatric ROS (+)  Hematology negative hematology ROS (+)   Anesthesia Other Findings   Reproductive/Obstetrics (+) Pregnancy                            Anesthesia Physical  Anesthesia Plan  ASA: 2  Anesthesia Plan: Epidural   Post-op Pain Management:    Induction:   PONV Risk Score and Plan: 2  Airway Management Planned: Natural Airway  Additional Equipment: None  Intra-op Plan:   Post-operative Plan:   Informed Consent: I have reviewed the patients History and Physical, chart, labs and discussed the procedure including the risks, benefits and alternatives for the proposed anesthesia with the patient or authorized representative who has indicated his/her understanding and acceptance.       Plan Discussed with: Anesthesiologist  Anesthesia Plan Comments:         Anesthesia Quick Evaluation

## 2021-03-03 LAB — CBC
HCT: 28.3 % — ABNORMAL LOW (ref 36.0–46.0)
Hemoglobin: 9.4 g/dL — ABNORMAL LOW (ref 12.0–15.0)
MCH: 30.9 pg (ref 26.0–34.0)
MCHC: 33.2 g/dL (ref 30.0–36.0)
MCV: 93.1 fL (ref 80.0–100.0)
Platelets: 213 10*3/uL (ref 150–400)
RBC: 3.04 MIL/uL — ABNORMAL LOW (ref 3.87–5.11)
RDW: 13.3 % (ref 11.5–15.5)
WBC: 12 10*3/uL — ABNORMAL HIGH (ref 4.0–10.5)
nRBC: 0 % (ref 0.0–0.2)

## 2021-03-03 MED ORDER — TRAMADOL HCL 50 MG PO TABS
50.0000 mg | ORAL_TABLET | Freq: Three times a day (TID) | ORAL | Status: DC | PRN
  Administered 2021-03-03: 50 mg via ORAL
  Filled 2021-03-03: qty 1

## 2021-03-03 MED ORDER — TRAMADOL HCL 50 MG PO TABS: 50.0000 mg | ORAL_TABLET | Freq: Three times a day (TID) | ORAL | 0 refills | Status: DC | PRN

## 2021-03-03 MED ORDER — ACETAMINOPHEN 325 MG PO TABS: 650.0000 mg | ORAL_TABLET | ORAL | 1 refills | Status: DC | PRN

## 2021-03-03 MED ORDER — IBUPROFEN 600 MG PO TABS: 600.0000 mg | ORAL_TABLET | Freq: Four times a day (QID) | ORAL | 0 refills | Status: DC

## 2021-03-03 NOTE — Discharge Summary (Signed)
Postpartum Discharge Summary       Patient Name: Christina Warner DOB: June 15, 2019 MRN: [id number]  Date of admission: 03/02/2021 Delivery date:03/02/2021  Delivering provider: Huel Cote  Date of discharge: 03/03/2021  Admitting diagnosis: Uterine contractions [O47.9] NSVD (normal spontaneous vaginal delivery) [O80] Intrauterine pregnancy: [redacted]w[redacted]d     Secondary diagnosis:  Principal Problem:   Uterine contractions Active Problems:   NSVD (normal spontaneous vaginal delivery)  Additional problems: GBS positive    Discharge diagnosis: Term Pregnancy Delivered                                              Post partum procedures: none Augmentation: N/A Complications: None  Hospital course: Onset of Labor With Vaginal Delivery      25 y.o. yo G3P3003 at [redacted]w[redacted]d was admitted in Active Labor on 03/02/2021. Patient had an uncomplicated labor course as follows:  Membrane Rupture Time/Date: 3:00 AM ,03/02/2021   Delivery Method:Vaginal, Spontaneous  Episiotomy: None  Lacerations:  1st degree  Patient had an uncomplicated postpartum course.  She is ambulating, tolerating a regular diet, passing flatus, and urinating well. Patient is discharged home in stable condition on 03/03/21 because she needed to go home but will come back and room in with the baby tonight for +GBS status with less than 4 hours of treatment.  Newborn Data: Birth date:03/02/2021  Birth time:8:15 AM  Gender:Female  Living status:Living  Apgars:9 ,9  Weight:3275 g    Physical exam  Vitals:   03/02/21 1937 03/02/21 2355 03/03/21 0719 03/03/21 1135  BP: 121/63 (!) 97/44 (!) 106/57 112/68  Pulse: 75 75 80 78  Resp: 18 18 18 18   Temp: 97.7 F (36.5 C)  97.9 F (36.6 C) 97.7 F (36.5 C)  TempSrc: Oral  Oral Oral  SpO2:  100% 98% 100%   General: alert and cooperative Lochia: appropriate Uterine Fundus: firm  Labs: Lab Results  Component Value Date   WBC 12.0 (H) 03/03/2021   HGB 9.4 (L)  03/03/2021   HCT 28.3 (L) 03/03/2021   MCV 93.1 03/03/2021   PLT 213 03/03/2021   CMP Latest Ref Rng & Units 06/26/2020  Glucose 70 - 99 mg/dL 06/28/2020)  BUN 6 - 20 mg/dL 6  Creatinine 384(Y - 6.59 mg/dL 9.35  Sodium 7.01 - 779 mmol/L 137  Potassium 3.5 - 5.1 mmol/L 4.1  Chloride 98 - 111 mmol/L 106  CO2 22 - 32 mmol/L 25  Calcium 8.9 - 10.3 mg/dL 9.5  Total Protein 6.5 - 8.1 g/dL 7.6  Total Bilirubin 0.3 - 1.2 mg/dL 0.7  Alkaline Phos 38 - 126 U/L 71  AST 15 - 41 U/L 19  ALT 0 - 44 U/L 36   Edinburgh Score: Edinburgh Postnatal Depression Scale Screening Tool 07/09/2018  I have been able to laugh and see the funny side of things. (No Data)     After visit meds:  Allergies as of 03/03/2021   No Known Allergies      Medication List     STOP taking these medications    Doxylamine-Pyridoxine 10-10 MG Tbec Commonly known as: Diclegis   terconazole 0.4 % vaginal cream Commonly known as: TERAZOL 7       TAKE these medications    acetaminophen 325 MG tablet Commonly known as: Tylenol Take 2 tablets (650 mg total) by mouth every 4 (four)  hours as needed (for pain scale < 4). What changed:  medication strength how much to take when to take this reasons to take this   albuterol 108 (90 Base) MCG/ACT inhaler Commonly known as: VENTOLIN HFA Inhale 1-2 puffs into the lungs every 6 (six) hours as needed for wheezing or shortness of breath.   ibuprofen 600 MG tablet Commonly known as: ADVIL Take 1 tablet (600 mg total) by mouth every 6 (six) hours.   multivitamin-prenatal 27-0.8 MG Tabs tablet Take 1 tablet by mouth daily at 12 noon.   traMADol 50 MG tablet Commonly known as: ULTRAM Take 1 tablet (50 mg total) by mouth every 8 (eight) hours as needed for moderate pain.         Discharge home in stable condition Infant Feeding: Bottle Infant Disposition:rooming in Discharge instruction: per After Visit Summary and Postpartum booklet. Activity: Advance as  tolerated. Pelvic rest for 6 weeks.  Diet: routine diet Future Appointments:No future appointments. Follow up Visit:  Follow-up Information     Melvenia Beam, MD. Schedule an appointment as soon as possible for a visit in 4 week(s).   Specialty: Family Medicine Why: postpartum Contact information: [address]  [address] [place] [address] [phone]                  Please schedule this patient for a In person postpartum visit in 4 weeks with the following provider: MD.  Delivery mode:  Vaginal, Spontaneous  Anticipated Birth Control:  Unsure   03/03/2021 Oliver Pila, MD

## 2021-03-03 NOTE — Progress Notes (Signed)
Post Partum Day 1 Subjective: no complaints, up ad lib, and tolerating PO  Bottlefeeding.  Would like d/c home this PM  Objective: Blood pressure (!) 106/57, pulse 80, temperature 97.9 F (36.6 C), temperature source Oral, resp. rate 18, last menstrual period 05/26/2020, SpO2 98 %, unknown if currently breastfeeding.  Physical Exam:  General: alert and cooperative Lochia: appropriate Uterine Fundus: firm   Recent Labs    03/02/21 0421 03/03/21 0453  HGB 11.9* 9.4*  HCT 36.3 28.3*    Assessment/Plan: Pt doiung well. D/w her peds may recommend she stay until AM given +GBS with 3 hours of abx.   LOS: 1 day   Oliver Pila 03/03/2021, 8:59 AM

## 2021-03-03 NOTE — Progress Notes (Signed)
Pt given discharge instructions and all questions answered. Pt verbalized understanding. Pt understands she must take her own supplied medications and that her baby will remain a patient.

## 2021-03-05 ENCOUNTER — Inpatient Hospital Stay (HOSPITAL_COMMUNITY)
Admission: AD | Admit: 2021-03-05 | Payer: Medicaid Other | Source: Home / Self Care | Admitting: Obstetrics and Gynecology

## 2021-03-05 ENCOUNTER — Inpatient Hospital Stay (HOSPITAL_COMMUNITY): Payer: Medicaid Other

## 2021-03-13 ENCOUNTER — Telehealth (HOSPITAL_COMMUNITY): Payer: Self-pay | Admitting: *Deleted

## 2021-03-13 NOTE — Telephone Encounter (Signed)
Patient voiced no questions or concerns at this time. EPDS= 0. Patient voiced no questions or concerns regarding infant at this time. Patient reports infant sleeps in a bassinet on her back. RN reviewed ABCs of safe sleep. Patient verbalized understanding. Deforest Hoyles, RN, 03/13/21, [phone]

## 2021-03-16 ENCOUNTER — Telehealth (HOSPITAL_COMMUNITY): Payer: Self-pay | Admitting: *Deleted

## 2021-03-16 NOTE — Telephone Encounter (Signed)
Phone message received from patient. Return call placed. RN left message for patient. Deforest Hoyles, RN, 03/16/21, [phone]

## 2021-03-18 ENCOUNTER — Telehealth (HOSPITAL_COMMUNITY): Payer: Self-pay | Admitting: *Deleted

## 2021-03-18 NOTE — Telephone Encounter (Signed)
Patient called this RN mistakenly. Looking for home visiting nurse. RN provided patient with phone number for [facility]. Deforest Hoyles, RN, 03/18/21, [phone]

## 2021-03-21 ENCOUNTER — Other Ambulatory Visit: Payer: Self-pay

## 2021-03-21 ENCOUNTER — Ambulatory Visit (HOSPITAL_COMMUNITY)
Admission: EM | Admit: 2021-03-21 | Discharge: 2021-03-21 | Disposition: A | Discharge status: Home / Self Care | Payer: Medicaid Other | Attending: Family Medicine | Admitting: Family Medicine

## 2021-03-21 ENCOUNTER — Ambulatory Visit (INDEPENDENT_AMBULATORY_CARE_PROVIDER_SITE_OTHER): Payer: Medicaid Other

## 2021-03-21 ENCOUNTER — Encounter (HOSPITAL_COMMUNITY): Payer: Self-pay

## 2021-03-21 DIAGNOSIS — R059 Cough, unspecified: Secondary | ICD-10-CM

## 2021-03-21 DIAGNOSIS — R051 Acute cough: Secondary | ICD-10-CM

## 2021-03-21 DIAGNOSIS — R0602 Shortness of breath: Secondary | ICD-10-CM

## 2021-03-21 DIAGNOSIS — J4521 Mild intermittent asthma with (acute) exacerbation: Secondary | ICD-10-CM | POA: Diagnosis not present

## 2021-03-21 DIAGNOSIS — R519 Headache, unspecified: Secondary | ICD-10-CM | POA: Diagnosis not present

## 2021-03-21 DIAGNOSIS — R509 Fever, unspecified: Secondary | ICD-10-CM

## 2021-03-21 DIAGNOSIS — Z20828 Contact with and (suspected) exposure to other viral communicable diseases: Secondary | ICD-10-CM

## 2021-03-21 MED ORDER — KETOROLAC TROMETHAMINE 60 MG/2ML IM SOLN
INTRAMUSCULAR | Status: AC
  Filled 2021-03-21: qty 2

## 2021-03-21 MED ORDER — KETOROLAC TROMETHAMINE 60 MG/2ML IM SOLN
60.0000 mg | Freq: Once | INTRAMUSCULAR | Status: AC
  Administered 2021-03-21: 60 mg via INTRAMUSCULAR

## 2021-03-21 MED ORDER — PREDNISONE 20 MG PO TABS: 40.0000 mg | ORAL_TABLET | Freq: Every day | ORAL | 0 refills | Status: DC

## 2021-03-21 MED ORDER — PROMETHAZINE-DM 6.25-15 MG/5ML PO SYRP: 5.0000 mL | ORAL_SOLUTION | Freq: Four times a day (QID) | ORAL | 0 refills | Status: DC | PRN

## 2021-03-21 NOTE — ED Triage Notes (Signed)
Pt presents with c/o headache and cough x 1 week.

## 2021-03-21 NOTE — ED Provider Notes (Signed)
[facility]   [id number] 03/21/21 Arrival Time: 1658  ASSESSMENT & PLAN:  1. Fever, unspecified fever cause   2. Acute cough   3. SOB (shortness of breath)   4. Mild intermittent asthma with acute exacerbation   5. Exposure to influenza    I have personally viewed the imaging studies ordered this visit. No signs of PNA on CXR.  Discussed typical duration of viral illnesses. Inhaler use as needed. OTC symptom Warner as needed.  Meds ordered this encounter  Medications   ketorolac (TORADOL) injection 60 mg   predniSONE (DELTASONE) 20 MG tablet    Sig: Take 2 tablets (40 mg total) by mouth daily.    Dispense:  10 tablet    Refill:  0   promethazine-dextromethorphan (PROMETHAZINE-DM) 6.25-15 MG/5ML syrup    Sig: Take 5 mLs by mouth 4 (four) times daily as needed for cough.    Dispense:  118 mL    Refill:  0    Follow-up Information     Christina [facility] at [facility].   Specialty: Urgent Warner Why: If worsening or failing to improve as anticipated. Contact information: [address] [place] [address] [phone]                Reviewed expectations re: course of current medical issues. Questions answered. Outlined signs and symptoms indicating need for more acute intervention. Understanding verbalized. After Visit Summary given.   SUBJECTIVE: History from: patient. Christina Warner is a 25 y.o. female who reports: HA, cough; x 1 weeks; children with influenza. Reports subj fever/chills past 48 hours. Also reports wheezing/asthma flare past sev days with occas SOB.  No tx PTA. Normal PO intake without n/v/d. Overall very fatigued.   OBJECTIVE:  Vitals:   03/21/21 1825  BP: 130/77  Pulse: (!) 104  Resp: 17  Temp: (!) 100.5 F (38.1 C)  TempSrc: Oral  SpO2: 97%    General appearance: alert; no distress; fatigued-appearing Eyes: PERRLA; EOMI; conjunctiva normal HENT: [place]; AT; with nasal congestion Neck: supple   Lungs: speaks full sentences without difficulty; unlabored; active cough; bilateral exp wheezing Extremities: no edema Skin: warm and dry Neurologic: normal gait Psychological: alert and cooperative; normal mood and affect   Imaging: DG Chest 2 View  Result Date: 03/21/2021 CLINICAL DATA:  Cough and fevers with shortness of breath EXAM: CHEST - 2 VIEW COMPARISON:  01/15/2021 FINDINGS: The heart size and mediastinal contours are within normal limits. Both lungs are clear. The visualized skeletal structures are unremarkable. IMPRESSION: No active cardiopulmonary disease. Electronically Signed   By: Alcide Clever M.D.   On: 03/21/2021 18:59     No Known Allergies  Past Medical History:  Diagnosis Date   ADHD (attention deficit hyperactivity disorder)    Asthma    Chlamydia    Depression    UTI (urinary tract infection)    Social History   Socioeconomic History   Marital status: Married    Spouse name: Christina Warner   Number of children: 1   Years of education: Not on file   Highest education level: Not on file  Occupational History   Not on file  Tobacco Use   Smoking status: Every Day    Packs/day: 1.00    Years: 9.00    Pack years: 9.00    Types: Cigarettes    Start date: 04/15/2011   Smokeless tobacco: Never  Vaping Use   Vaping Use: Never used  Substance and Sexual Activity  Alcohol use: Not Currently    Alcohol/week: 5.0 standard drinks    Types: 5 Shots of liquor per week   Drug use: Not Currently    Frequency: 1.0 times per week    Types: Marijuana   Sexual activity: Not Currently    Birth control/protection: None  Other Topics Concern   Not on file  Social History Narrative   Not on file   Social Determinants of Health   Financial Resource Strain: Not on file  Food Insecurity: Not on file  Transportation Needs: Not on file  Physical Activity: Not on file  Stress: Not on file  Social Connections: Not on file  Intimate Partner Violence: Not on  file   Family History  Problem Relation Age of Onset   Diabetes Mother    Cancer Mother    Depression Mother    Anxiety disorder Mother    Depression Sister    Breast cancer Neg Hx    Ovarian cancer Neg Hx    Colon cancer Neg Hx    Past Surgical History:  Procedure Laterality Date   TYMPANOSTOMY TUBE PLACEMENT     WISDOM TOOTH EXTRACTION       Mardella Layman, MD 03/21/21 [name]

## 2021-03-21 NOTE — Discharge Instructions (Signed)
Meds ordered this encounter  Medications   ketorolac (TORADOL) injection 60 mg   predniSONE (DELTASONE) 20 MG tablet    Sig: Take 2 tablets (40 mg total) by mouth daily.    Dispense:  10 tablet    Refill:  0   promethazine-dextromethorphan (PROMETHAZINE-DM) 6.25-15 MG/5ML syrup    Sig: Take 5 mLs by mouth 4 (four) times daily as needed for cough.    Dispense:  118 mL    Refill:  0

## 2021-03-21 NOTE — ED Triage Notes (Signed)
Pt last took Tylenol an hour ago.

## 2021-06-16 IMAGING — US US OB < 14 WEEKS - US OB TV
1 series · 15 of 28 positions shown · non-contrast
Comparison: None.

CLINICAL DATA: Abdominal cramping

EXAM:
OBSTETRIC <14 WK US AND TRANSVAGINAL OB US
TECHNIQUE: Both transabdominal and transvaginal ultrasound examinations were
performed for complete evaluation of the gestation as well as the
maternal uterus, adnexal regions, and pelvic cul-de-sac.
Transvaginal technique was performed to assess early pregnancy.

[Series 1: us ob < 14 weeks - us ob tv · 15 of 51 slices shown]
[im 1/51]
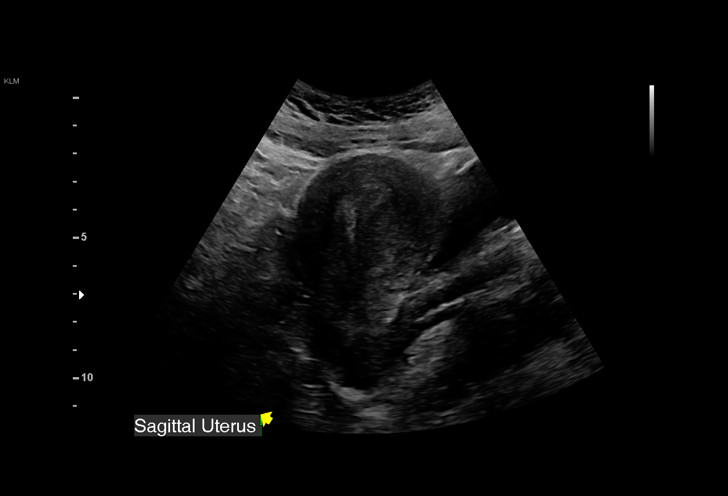
[im 4/51]
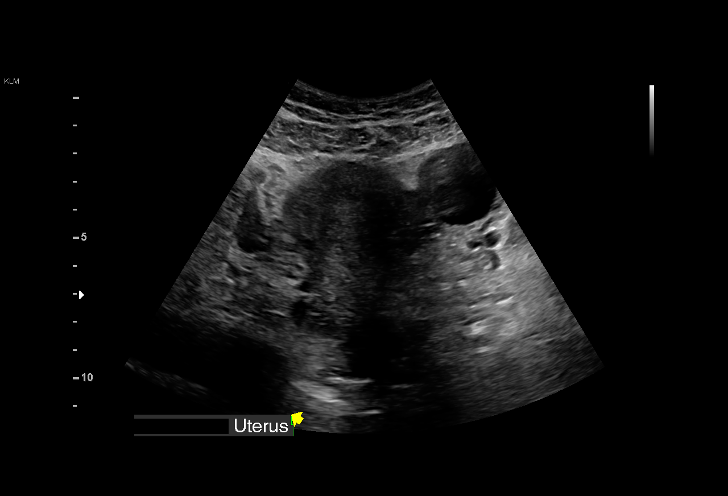
[im 8/51]
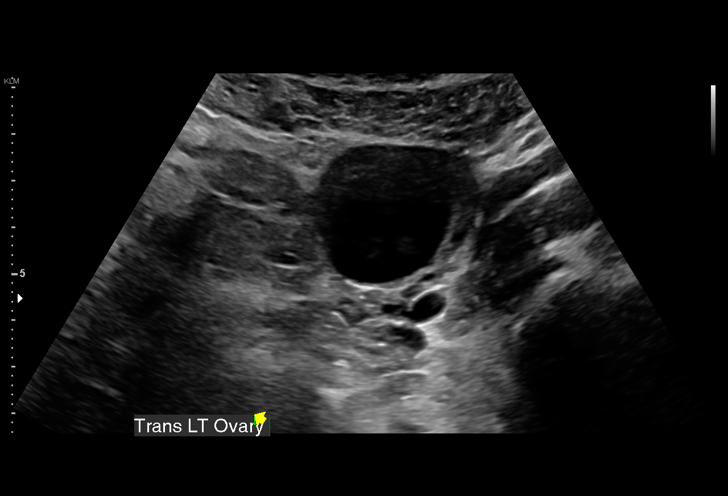
[im 12/51]
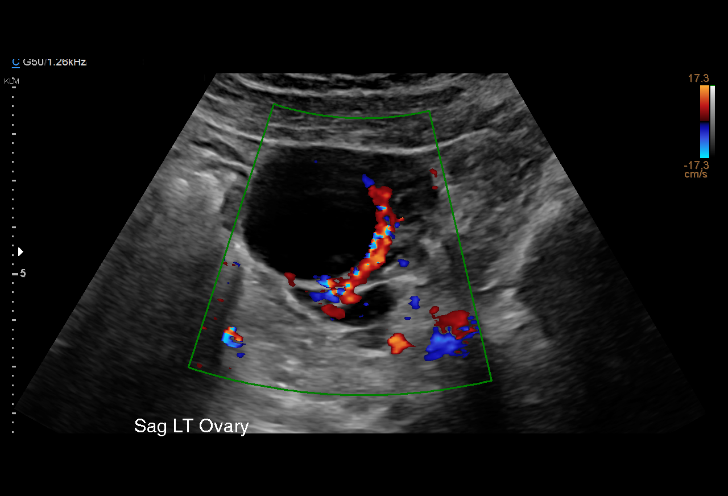
[im 15/51]
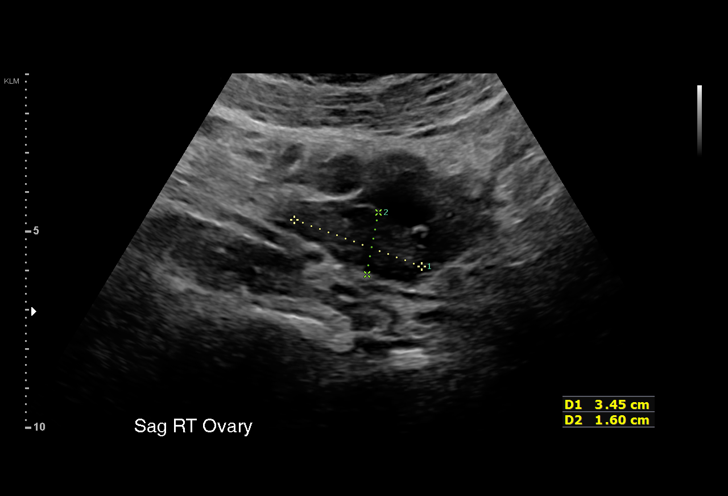
[im 19/51]
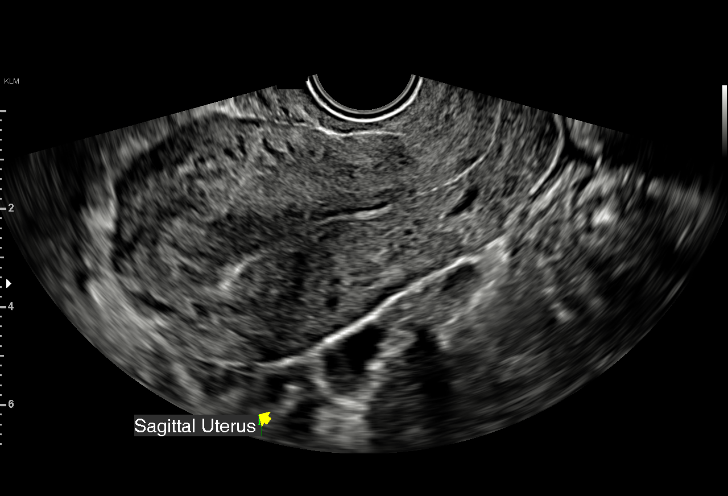
[im 23/51]
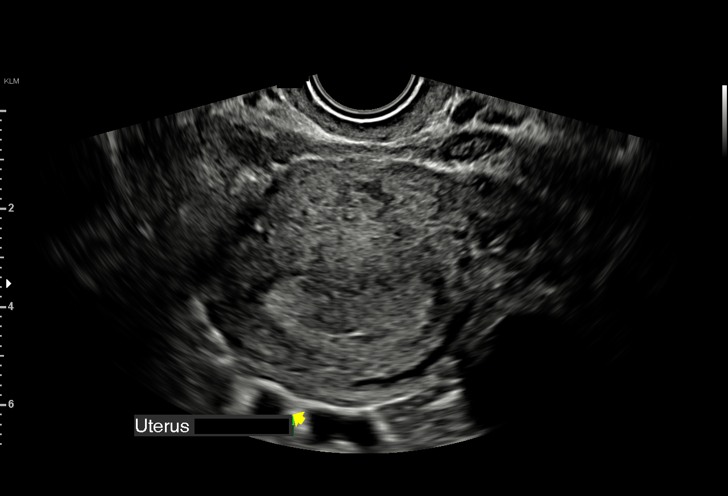
[im 26/51]
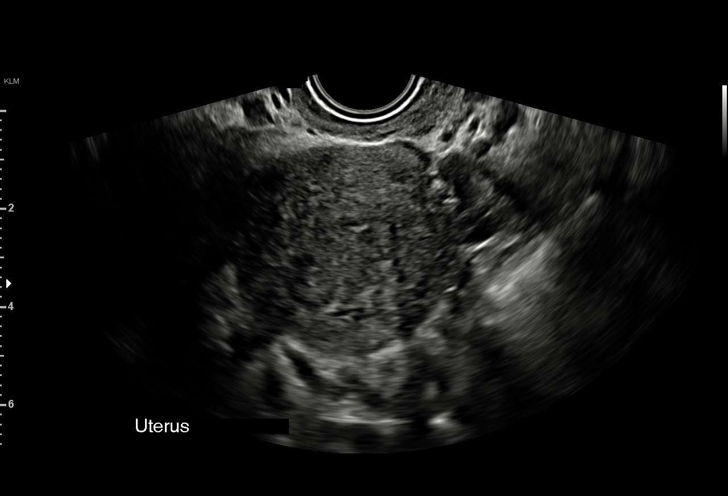
[im 28/51]
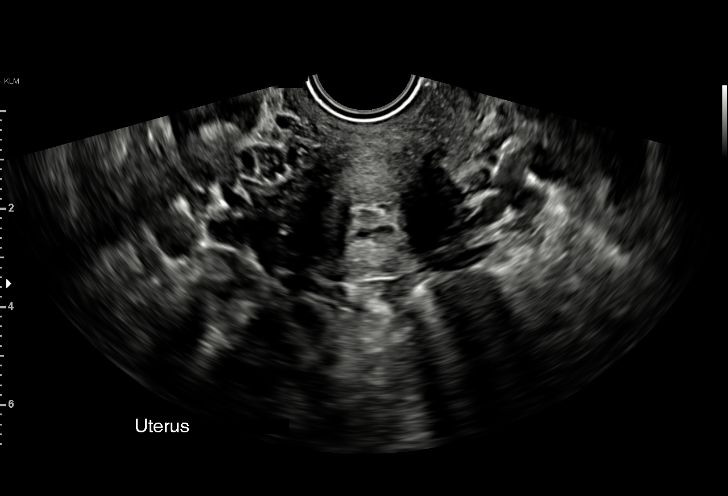
[im 32/51]
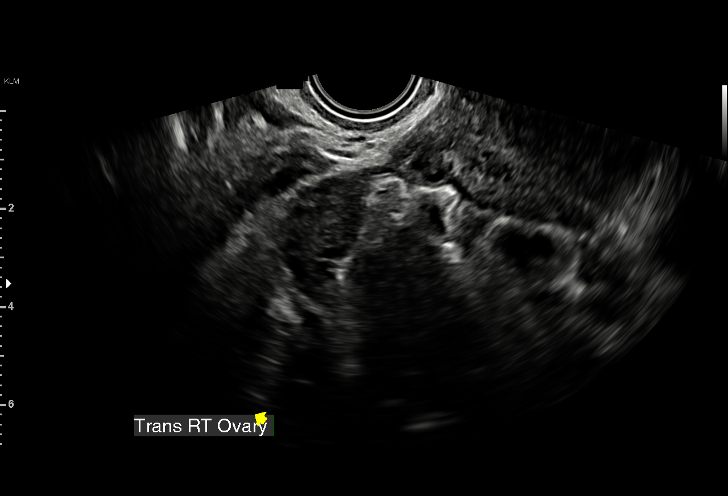
[im 36/51]
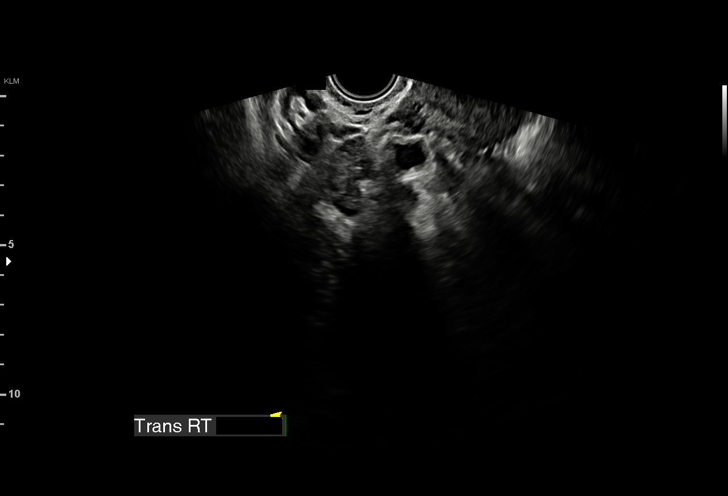
[im 39/51]
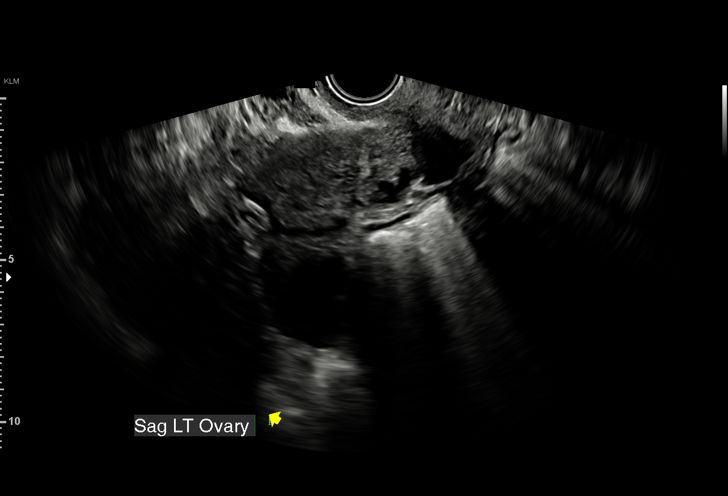
[im 43/51]
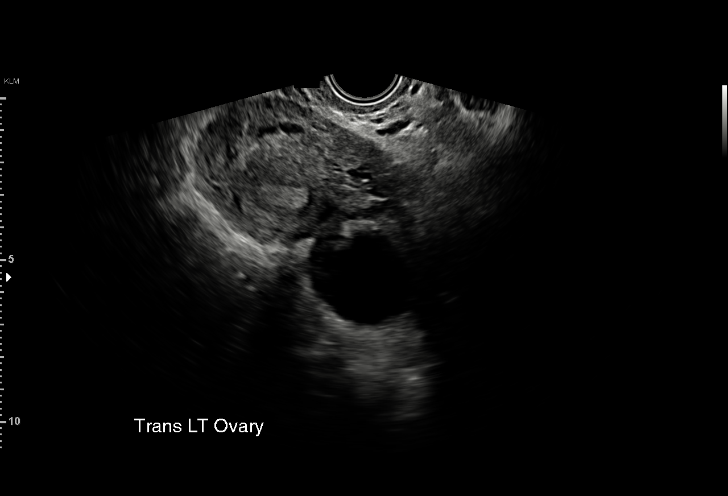
[im 47/51]
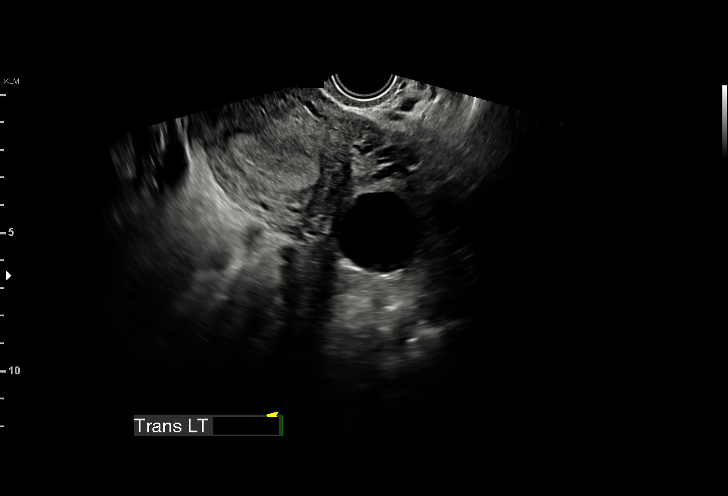
[im 51/51]
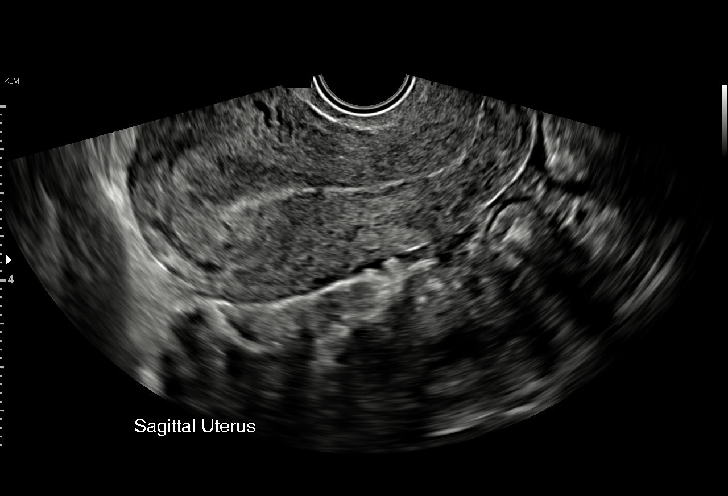

[15 of 28 positions shown; findings below may reference images not displayed]

FINDINGS: Intrauterine gestational sac: None

Yolk sac:  Not Visualized.

Embryo:  Not Visualized.

Cardiac Activity: Not Visualized.

Heart Rate:   bpm

MSD:   mm    w     d

CRL:    mm    w    d                  US EDC:

Subchorionic hemorrhage:  None visualized.

Maternal uterus/adnexae: 3.3 cm simple cyst in the left ovary. No
adnexal mass or free fluid.
IMPRESSION: No intrauterine pregnancy visualized. Differential considerations
would include early intrauterine pregnancy too early to visualize,
spontaneous abortion, or occult ectopic pregnancy. Recommend close
clinical followup and serial quantitative beta HCGs and ultrasounds.

3.3 cm left ovarian cyst.

## 2021-07-30 ENCOUNTER — Other Ambulatory Visit: Payer: Self-pay | Admitting: Obstetrics and Gynecology

## 2021-07-31 ENCOUNTER — Other Ambulatory Visit: Payer: Self-pay | Admitting: Obstetrics and Gynecology

## 2021-07-31 DIAGNOSIS — N6310 Unspecified lump in the right breast, unspecified quadrant: Secondary | ICD-10-CM

## 2021-08-19 ENCOUNTER — Ambulatory Visit
Admission: RE | Admit: 2021-08-19 | Discharge: 2021-08-19 | Disposition: A | Discharge status: Home / Self Care | Payer: Medicaid Other | Source: Ambulatory Visit | Attending: Obstetrics and Gynecology | Admitting: Obstetrics and Gynecology

## 2021-08-19 DIAGNOSIS — N6310 Unspecified lump in the right breast, unspecified quadrant: Secondary | ICD-10-CM

## 2021-10-16 ENCOUNTER — Emergency Department: Payer: Medicaid Other

## 2021-10-16 ENCOUNTER — Emergency Department
Admission: EM | Admit: 2021-10-16 | Discharge: 2021-10-16 | Disposition: A | Discharge status: Home / Self Care | Payer: Medicaid Other | Attending: Emergency Medicine | Admitting: Emergency Medicine

## 2021-10-16 ENCOUNTER — Other Ambulatory Visit: Payer: Self-pay

## 2021-10-16 DIAGNOSIS — F17200 Nicotine dependence, unspecified, uncomplicated: Secondary | ICD-10-CM | POA: Diagnosis not present

## 2021-10-16 DIAGNOSIS — Z720 Tobacco use: Secondary | ICD-10-CM

## 2021-10-16 DIAGNOSIS — J45909 Unspecified asthma, uncomplicated: Secondary | ICD-10-CM | POA: Diagnosis not present

## 2021-10-16 DIAGNOSIS — J40 Bronchitis, not specified as acute or chronic: Secondary | ICD-10-CM | POA: Diagnosis not present

## 2021-10-16 DIAGNOSIS — Z20822 Contact with and (suspected) exposure to covid-19: Secondary | ICD-10-CM | POA: Diagnosis not present

## 2021-10-16 DIAGNOSIS — J069 Acute upper respiratory infection, unspecified: Secondary | ICD-10-CM | POA: Diagnosis not present

## 2021-10-16 DIAGNOSIS — R0981 Nasal congestion: Secondary | ICD-10-CM | POA: Diagnosis present

## 2021-10-16 LAB — RESP PANEL BY RT-PCR (FLU A&B, COVID) ARPGX2
Influenza A by PCR: NEGATIVE
Influenza B by PCR: NEGATIVE
SARS Coronavirus 2 by RT PCR: NEGATIVE

## 2021-10-16 LAB — POC URINE PREG, ED: Preg Test, Ur: NEGATIVE

## 2021-10-16 LAB — GROUP A STREP BY PCR: Group A Strep by PCR: NOT DETECTED

## 2021-10-16 MED ORDER — IBUPROFEN 400 MG PO TABS
400.0000 mg | ORAL_TABLET | Freq: Once | ORAL | Status: AC
  Administered 2021-10-16: 400 mg via ORAL
  Filled 2021-10-16: qty 1

## 2021-10-16 MED ORDER — BENZONATATE 100 MG PO CAPS: 100.0000 mg | ORAL_CAPSULE | Freq: Three times a day (TID) | ORAL | 0 refills | Status: AC | PRN

## 2021-10-16 NOTE — ED Triage Notes (Signed)
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 States she has been taking OTC meds with no relief.

## 2021-10-16 NOTE — ED Provider Notes (Signed)
[facility] Provider Note    Event Date/Time   First MD Initiated Contact with Patient 10/16/21 1400     (approximate)   History   URI   HPI  Christina Warner is a 26 y.o. female past medical history of tobacco abuse and asthma who presents for evaluation of approximately 1 week of congestion, cough, very tussive chest tightness fatigue and some nonbloody diarrhea.  No fevers, shortness of breath, abdominal pain, back pain, headache and earache, severe sore throat or other sick symptoms.  No recent illicit drug use.      Physical Exam  Triage Vital Signs: ED Triage Vitals  Enc Vitals Group     BP 10/16/21 1229 118/63     Pulse Rate 10/16/21 1228 (!) 54     Resp 10/16/21 1228 16     Temp 10/16/21 1228 98.2 F (36.8 C)     Temp Source 10/16/21 1228 Oral     SpO2 10/16/21 1229 97 %     Weight 10/16/21 1229 173 lb 9.6 oz (78.7 kg)     Height 10/16/21 1229 5\' 1"  (1.549 m)     Head Circumference --      Peak Flow --      Pain Score 10/16/21 1229 6     Pain Loc --      Pain Edu? --      Excl. in GC? --     Most recent vital signs: Vitals:   10/16/21 1228 10/16/21 1229  BP:  118/63  Pulse: (!) 54   Resp: 16   Temp: 98.2 F (36.8 C)   SpO2:  97%    General: Awake, no distress.  CV:  Good peripheral perfusion.  2+ radial pulses.  No significant murmur. Resp:  Normal effort.  Clear bilaterally. Abd:  No distention.  Soft throughout. Other:  TMs are unremarkable bilaterally.  Oropharynx mildly erythematous without any uvular deviation tonsil enlargement or exudates.  Patient is not meningitic.  There is no stridor or difficulty breathing.   ED Results / Procedures / Treatments  Labs (all labs ordered are listed, but only abnormal results are displayed) Labs Reviewed  RESP PANEL BY RT-PCR (FLU A&B, COVID) ARPGX2  GROUP A STREP BY PCR  POC URINE PREG, ED     EKG  EKG is remarkable sinus rhythm with ventricular rate of 85, normal axis,  unremarkable and without evidence of acute ischemia or significant arrhythmia.   RADIOLOGY Chest reviewed by myself shows no focal consoidation, effusion, edema, pneumothorax or other clear acute thoracic process. I also reviewed radiology interpretation and agree with findings described.    PROCEDURES:  Critical Care performed: No  Procedures    MEDICATIONS ORDERED IN ED: Medications  ibuprofen (ADVIL) tablet 400 mg (has no administration in time range)     IMPRESSION / MDM / ASSESSMENT AND PLAN / ED COURSE  I reviewed the triage vital signs and the nursing notes. Patient's presentation is most consistent with acute presentation with potential threat to life or bodily function.                               Differential diagnosis includes, but is not limited to bronchitis versus pneumonia versus possible bacterial pharyngitis.  No evidence of otitis media, acute asthma exacerbation or other deep space infection in the head or neck.  Patient does not appear dehydrated or septic.  COVID test is  negative.  Rapid strep is negative.  EKG is nonischemic.  Pregnancy test is negative.  Chest reviewed by myself shows no focal consoidation, effusion, edema, pneumothorax or other clear acute thoracic process. I also reviewed radiology interpretation and agree with findings described.  Suspect likely an acute viral bronchitis.  Discussed expected clinical course.  Counseled on tobacco cessation.  Discharged in stable condition.  Strict turn precautions advised and discussed.    FINAL CLINICAL IMPRESSION(S) / ED DIAGNOSES   Final diagnoses:  Upper respiratory tract infection, unspecified type  Bronchitis  Tobacco abuse     Rx / DC Orders   ED Discharge Orders          Ordered    benzonatate (TESSALON PERLES) 100 MG capsule  3 times daily PRN        10/16/21 1450             Note:  This document was prepared using Dragon voice recognition software and may include  unintentional dictation errors.   Gilles Chiquito, MD 10/16/21 [phone]

## 2021-10-16 NOTE — ED Provider Triage Note (Signed)
Emergency Medicine Provider Triage Evaluation Note  Christina Warner , a 26 y.o. female  was evaluated in triage.  Pt complains of cough, runny nose. Presents with nephew who is also sick. Tried OTC without relief. No fever  Review of Systems  Positive: Cough, runny nose Negative: fever  Physical Exam  There were no vitals taken for this visit. Gen:   Awake, no distress   Resp:  Normal effort  MSK:   Moves extremities without difficulty  Other:    Medical Decision Making  Medically screening exam initiated at 12:25 PM.  Appropriate orders placed.  ARNETIA BRONK was informed that the remainder of the evaluation will be completed by another provider, this initial triage assessment does not replace that evaluation, and the importance of remaining in the ED until their evaluation is complete.     Jackelyn Hoehn, PA-C 10/16/21 1226

## 2022-02-25 ENCOUNTER — Emergency Department: Payer: Medicaid Other

## 2022-02-25 ENCOUNTER — Emergency Department
Admission: EM | Admit: 2022-02-25 | Discharge: 2022-02-25 | Disposition: A | Discharge status: Home / Self Care | Payer: Medicaid Other | Attending: Emergency Medicine | Admitting: Emergency Medicine

## 2022-02-25 ENCOUNTER — Encounter: Payer: Self-pay | Admitting: Emergency Medicine

## 2022-02-25 ENCOUNTER — Other Ambulatory Visit: Payer: Self-pay

## 2022-02-25 DIAGNOSIS — S9032XA Contusion of left foot, initial encounter: Secondary | ICD-10-CM | POA: Diagnosis not present

## 2022-02-25 DIAGNOSIS — Y990 Civilian activity done for income or pay: Secondary | ICD-10-CM | POA: Diagnosis not present

## 2022-02-25 DIAGNOSIS — S99922A Unspecified injury of left foot, initial encounter: Secondary | ICD-10-CM | POA: Diagnosis present

## 2022-02-25 DIAGNOSIS — W208XXA Other cause of strike by thrown, projected or falling object, initial encounter: Secondary | ICD-10-CM | POA: Insufficient documentation

## 2022-02-25 MED ORDER — MELOXICAM 15 MG PO TABS: 15.0000 mg | ORAL_TABLET | Freq: Every day | ORAL | 0 refills | Status: DC

## 2022-02-25 NOTE — ED Provider Notes (Signed)
[facility] Provider Note  Patient Contact: 3:32 PM (approximate)   History   Foot Injury   HPI  Christina Warner is a 26 y.o. female who presents the emergency department complaining of left foot pain.  Patient dropped a box at work that was "heavy" on her foot last night.  Patient denies any open wounds.  States that her foot has been hurting since the time of injury.  This morning when she got up she could barely put any weight on her foot and presents for evaluation.  No history of fractures in the past.     Physical Exam   Triage Vital Signs: ED Triage Vitals  Enc Vitals Group     BP 02/25/22 1505 (!) 143/94     Pulse Rate 02/25/22 1505 71     Resp 02/25/22 1505 18     Temp 02/25/22 1505 98.4 F (36.9 C)     Temp Source 02/25/22 1505 Oral     SpO2 02/25/22 1505 100 %     Weight --      Height --      Head Circumference --      Peak Flow --      Pain Score 02/25/22 1506 7     Pain Loc --      Pain Edu? --      Excl. in [place]? --     Most recent vital signs: Vitals:   02/25/22 1505  BP: (!) 143/94  Pulse: 71  Resp: 18  Temp: 98.4 F (36.9 C)  SpO2: 100%     General: Alert and in no acute distress.  Cardiovascular:  Good peripheral perfusion Respiratory: Normal respiratory effort without tachypnea or retractions. Lungs CTAB.  Musculoskeletal: Full range of motion to all extremities.  Visualization left foot there is slight edema and slight ecchymosis with when compared with right foot along the tarsal bone region dorsal foot.  No open wounds.  Patient has good range of motion to the ankle.  Full range of motion to the digits.  Dorsalis pedis pulse intact.  Sensation intact all digits. Neurologic:  No gross focal neurologic deficits are appreciated.  Skin:   No rash noted Other:   ED Results / Procedures / Treatments   Labs (all labs ordered are listed, but only abnormal results are displayed) Labs Reviewed - No data to  display   EKG     RADIOLOGY  I personally viewed, evaluated, and interpreted these images as part of my medical decision making, as well as reviewing the written report by the radiologist.  ED Provider Interpretation: No acute traumatic findings on x-ray  DG Foot Complete Left  Result Date: 02/25/2022 CLINICAL DATA:  Left foot pain, injury EXAM: LEFT FOOT - COMPLETE 3+ VIEW COMPARISON:  None Available. FINDINGS: There is no evidence of fracture or dislocation. There is no evidence of arthropathy or other focal bone abnormality. Soft tissues are unremarkable. IMPRESSION: Negative. Electronically Signed   By: Duanne Guess D.O.   On: 02/25/2022 15:40    PROCEDURES:  Critical Care performed: No  Procedures   MEDICATIONS ORDERED IN ED: Medications - No data to display   IMPRESSION / MDM / ASSESSMENT AND PLAN / ED COURSE  I reviewed the triage vital signs and the nursing notes.                              Differential diagnosis includes,  but is not limited to, foot contusion, fracture, tarsal dislocation  Patient's presentation is most consistent with acute presentation with potential threat to life or bodily function.   Patient's diagnosis is consistent with foot contusion.  Patient presents emergency department with an injury to the left foot after dropping a box on it.  Overall exam was reassuring.  Imaging reveals no fracture.  Patient will be given postop shoe and anti-inflammatories.  Follow-up podiatry as needed..  Patient is given ED precautions to return to the ED for any worsening or new symptoms.        FINAL CLINICAL IMPRESSION(S) / ED DIAGNOSES   Final diagnoses:  Contusion of left foot, initial encounter     Rx / DC Orders   ED Discharge Orders          Ordered    meloxicam (MOBIC) 15 MG tablet  Daily        02/25/22 1639             Note:  This document was prepared using Dragon voice recognition software and may include unintentional  dictation errors.   Racheal Patches, PA-C 02/25/22 1649    Georga Hacking, MD 02/25/22 2308

## 2022-02-25 NOTE — ED Triage Notes (Signed)
Patient to ED for left foot pain. Patient states she was working last PM when a box fell on it. Ambulatory to triage but limping is noted.

## 2022-03-11 IMAGING — DX DG CHEST 2V
2 series · 2 of 2 positions shown · non-contrast
Comparison: 01/15/2021

CLINICAL DATA: Cough and fevers with shortness of breath

EXAM:
CHEST - 2 VIEW

[chest pa]
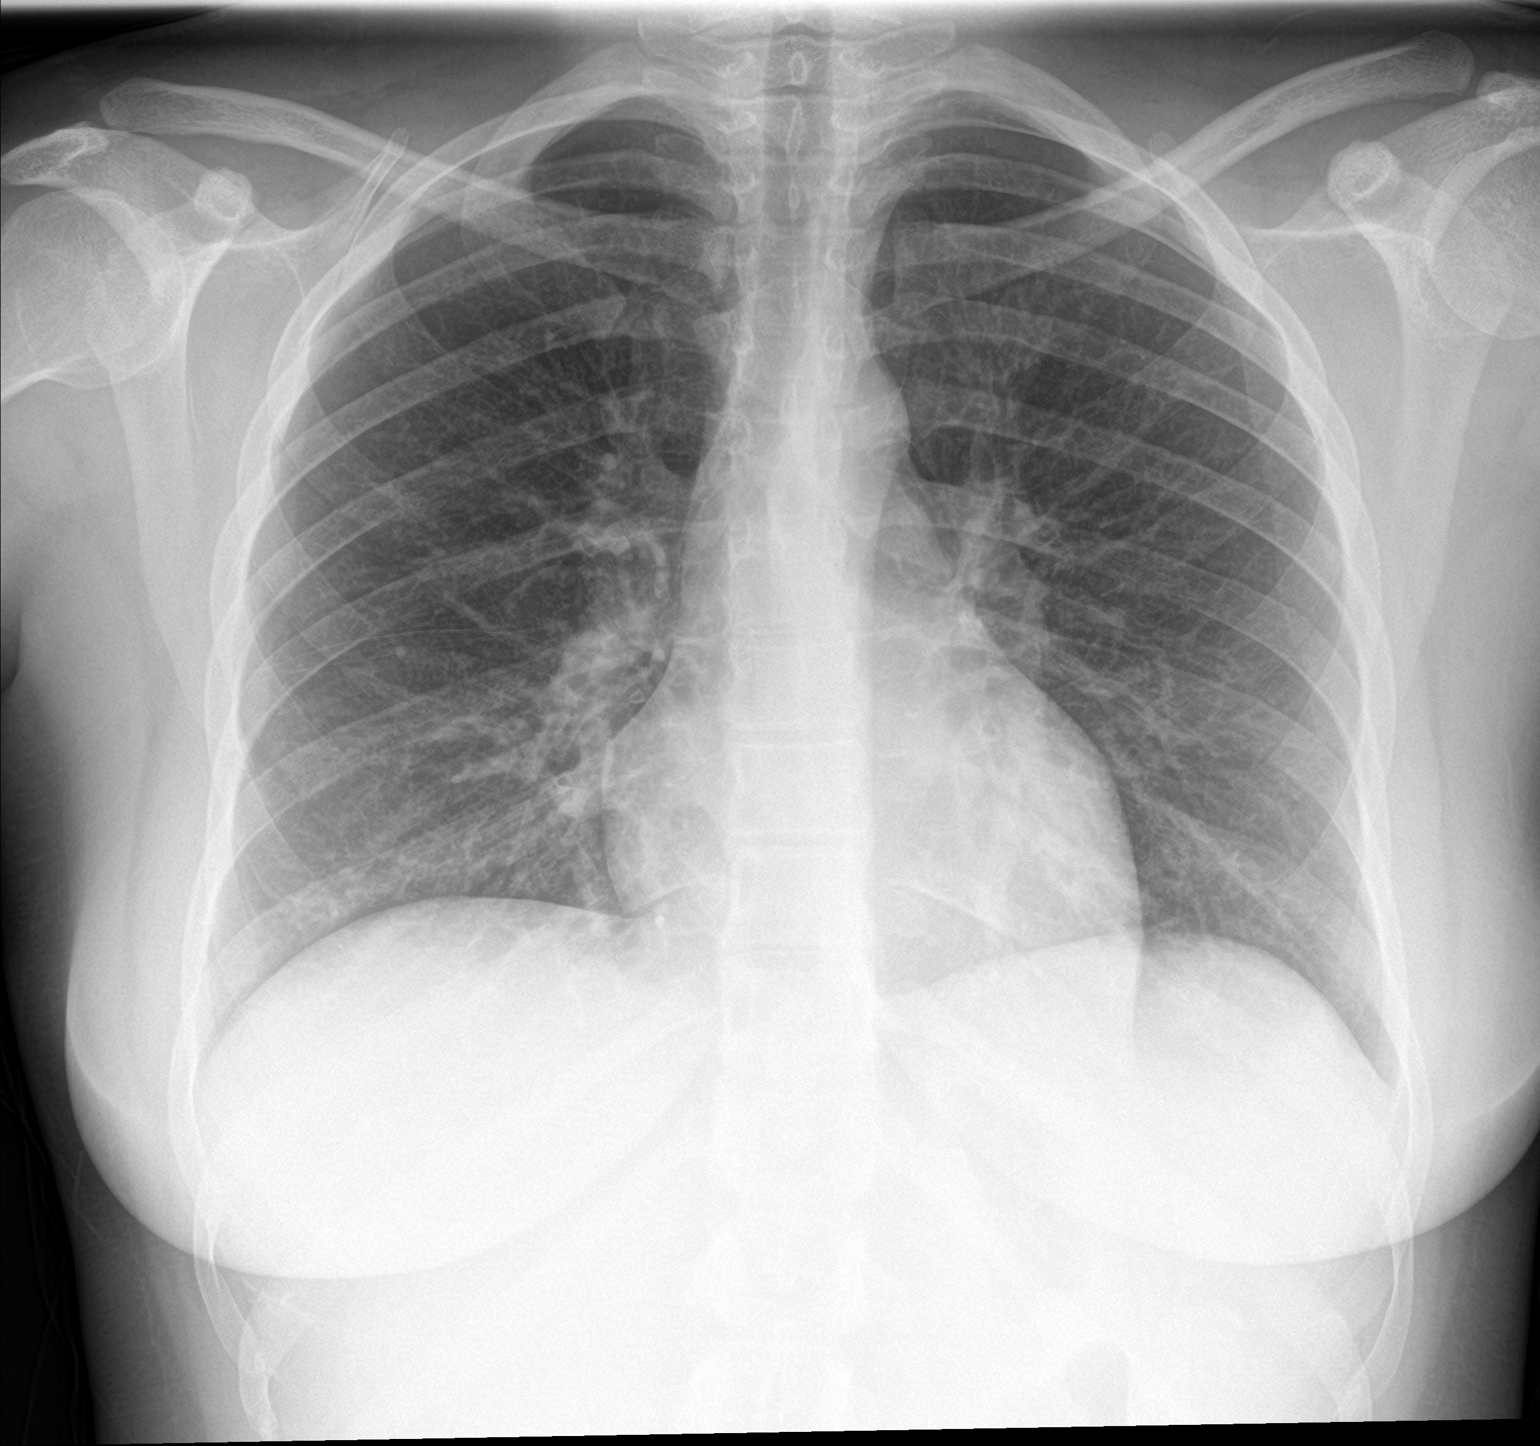

[chest lat]
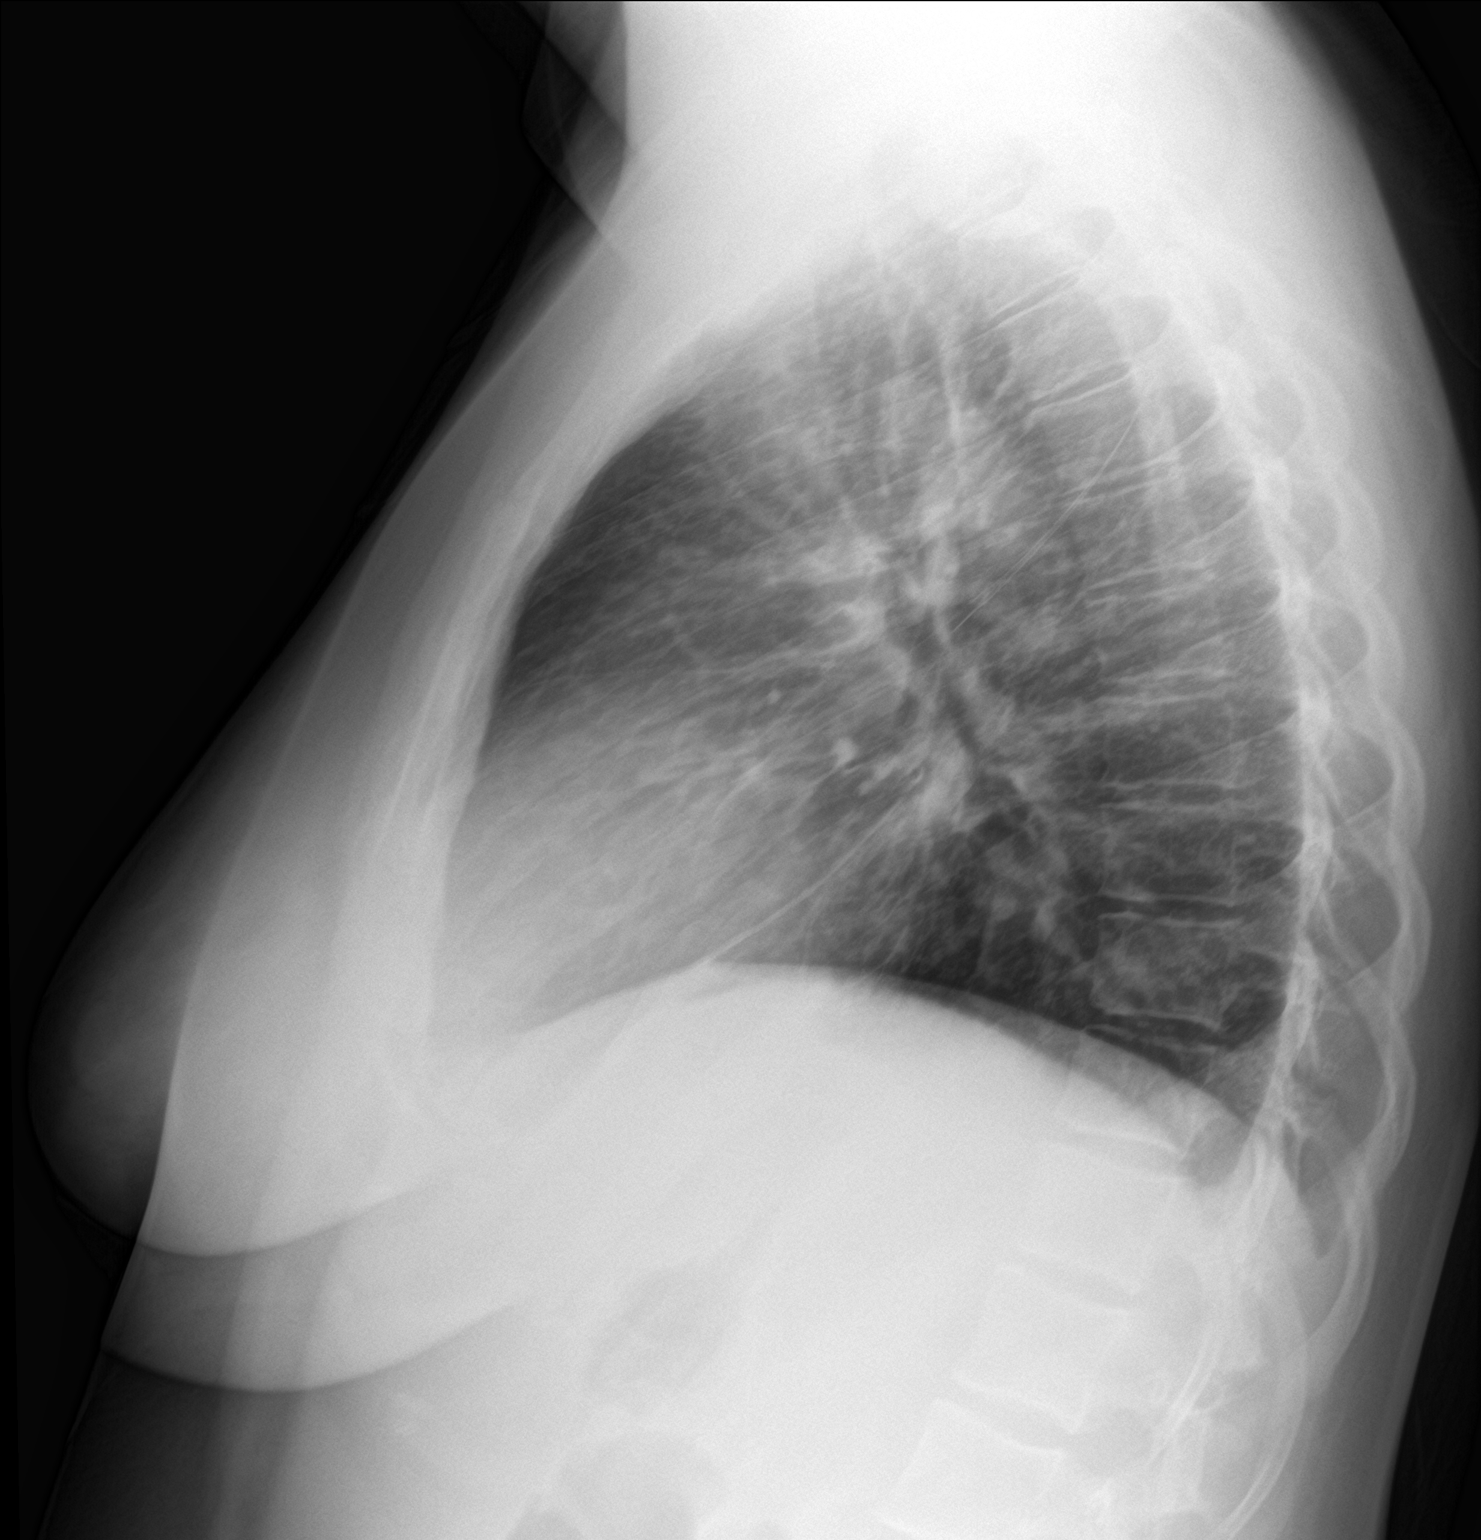

[2 of 2 positions shown; findings below may reference images not displayed]

FINDINGS: The heart size and mediastinal contours are within normal limits.
Both lungs are clear. The visualized skeletal structures are
unremarkable.
IMPRESSION: No active cardiopulmonary disease.

## 2022-04-19 ENCOUNTER — Ambulatory Visit (HOSPITAL_COMMUNITY)
Admission: EM | Admit: 2022-04-19 | Discharge: 2022-04-19 | Disposition: A | Discharge status: Home / Self Care | Payer: Medicaid Other | Attending: Emergency Medicine | Admitting: Emergency Medicine

## 2022-04-19 ENCOUNTER — Encounter (HOSPITAL_COMMUNITY): Payer: Self-pay

## 2022-04-19 DIAGNOSIS — J4521 Mild intermittent asthma with (acute) exacerbation: Secondary | ICD-10-CM | POA: Diagnosis not present

## 2022-04-19 DIAGNOSIS — J069 Acute upper respiratory infection, unspecified: Secondary | ICD-10-CM

## 2022-04-19 MED ORDER — PREDNISONE 20 MG PO TABS: 40.0000 mg | ORAL_TABLET | Freq: Every day | ORAL | 0 refills | Status: DC

## 2022-04-19 MED ORDER — ALBUTEROL SULFATE HFA 108 (90 BASE) MCG/ACT IN AERS: 1.0000 | INHALATION_SPRAY | Freq: Four times a day (QID) | RESPIRATORY_TRACT | 0 refills | Status: AC | PRN

## 2022-04-19 MED ORDER — PROMETHAZINE-DM 6.25-15 MG/5ML PO SYRP: 5.0000 mL | ORAL_SOLUTION | Freq: Four times a day (QID) | ORAL | 0 refills | Status: DC | PRN

## 2022-04-19 NOTE — ED Triage Notes (Signed)
Chief Complaint: Cough, Runny Nose, Sore Throat, SOB. Cough is slightly productive and painful. No fever or body aches. Patient has history of asthma   Onset: 3 days   Prescriptions or OTC medications tried: Yes- ibuprofen, tylenol, cough syrup otc     with no relief  Sick exposure: No  New foods, medications, or products: No  Recent Travel: No
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
## 2022-04-19 NOTE — Discharge Instructions (Signed)
Today you are being treated for inflammation to your upper airways , most likely being caused by a virus and should steadily improve in time it can take up to 7 to 10 days before you truly start to see a turnaround however things will get better  Begin use of prednisone every morning with food for 5 days to help reduce inflammation   May use albuterol inhaler taking 2 puffs every 4 hours as needed to help calm shortness of breath and wheezing  May use promethazine DM for coughing and additional comfort, be mindful this medication may make you drowsy  For worsening signs of breathing please go to the nearest emergency department for evaluation  In addition:    You can take Tylenol and/or Ibuprofen as needed for fever reduction and pain relief.   For cough: honey 1/2 to 1 teaspoon (you can dilute the honey in water or another fluid).  You can also use guaifenesin and dextromethorphan for cough. You can use a humidifier for chest congestion and cough.  If you don't have a humidifier, you can sit in the bathroom with the hot shower running.      For sore throat: try warm salt water gargles, cepacol lozenges, throat spray, warm tea or water with lemon/honey, popsicles or ice, or OTC cold relief medicine for throat discomfort.   For congestion: take a daily anti-histamine like Zyrtec, Claritin, and a oral decongestant, such as pseudoephedrine.  You can also use Flonase 1-2 sprays in each nostril daily.   It is important to stay hydrated: drink plenty of fluids (water, gatorade/powerade/pedialyte, juices, or teas) to keep your throat moisturized and help further relieve irritation/discomfort.

## 2022-05-07 ENCOUNTER — Other Ambulatory Visit: Payer: Self-pay

## 2022-05-07 ENCOUNTER — Emergency Department
Admission: EM | Admit: 2022-05-07 | Discharge: 2022-05-07 | Disposition: A | Discharge status: Home / Self Care | Payer: Medicaid Other | Attending: Emergency Medicine | Admitting: Emergency Medicine

## 2022-05-07 ENCOUNTER — Emergency Department: Payer: Medicaid Other

## 2022-05-07 DIAGNOSIS — S99912A Unspecified injury of left ankle, initial encounter: Secondary | ICD-10-CM | POA: Diagnosis present

## 2022-05-07 DIAGNOSIS — Y92009 Unspecified place in unspecified non-institutional (private) residence as the place of occurrence of the external cause: Secondary | ICD-10-CM | POA: Insufficient documentation

## 2022-05-07 DIAGNOSIS — W010XXA Fall on same level from slipping, tripping and stumbling without subsequent striking against object, initial encounter: Secondary | ICD-10-CM | POA: Diagnosis not present

## 2022-05-07 DIAGNOSIS — S93402A Sprain of unspecified ligament of left ankle, initial encounter: Secondary | ICD-10-CM | POA: Insufficient documentation

## 2022-05-07 DIAGNOSIS — S1093XA Contusion of unspecified part of neck, initial encounter: Secondary | ICD-10-CM | POA: Insufficient documentation

## 2022-05-07 DIAGNOSIS — J45909 Unspecified asthma, uncomplicated: Secondary | ICD-10-CM | POA: Diagnosis not present

## 2022-05-07 DIAGNOSIS — S0083XA Contusion of other part of head, initial encounter: Secondary | ICD-10-CM | POA: Diagnosis not present

## 2022-05-07 DIAGNOSIS — M545 Low back pain, unspecified: Secondary | ICD-10-CM | POA: Diagnosis not present

## 2022-05-07 DIAGNOSIS — T07XXXA Unspecified multiple injuries, initial encounter: Secondary | ICD-10-CM

## 2022-05-07 LAB — URINALYSIS, ROUTINE W REFLEX MICROSCOPIC
Bilirubin Urine: NEGATIVE
Glucose, UA: NEGATIVE mg/dL
Hgb urine dipstick: NEGATIVE
Ketones, ur: NEGATIVE mg/dL
Nitrite: NEGATIVE
Protein, ur: NEGATIVE mg/dL
Specific Gravity, Urine: 1.019 (ref 1.005–1.030)
pH: 5 (ref 5.0–8.0)

## 2022-05-07 LAB — POC URINE PREG, ED: Preg Test, Ur: NEGATIVE

## 2022-05-07 MED ORDER — TRAMADOL HCL 50 MG PO TABS: 50.0000 mg | ORAL_TABLET | Freq: Four times a day (QID) | ORAL | 0 refills | Status: DC | PRN

## 2022-05-07 MED ORDER — MELOXICAM 15 MG PO TABS: 15.0000 mg | ORAL_TABLET | Freq: Every day | ORAL | 2 refills | Status: DC

## 2022-05-07 MED ORDER — CEPHALEXIN 500 MG PO CAPS
500.0000 mg | ORAL_CAPSULE | Freq: Three times a day (TID) | ORAL | 0 refills | Status: AC
Start: 2022-05-07 — End: 2022-05-14

## 2022-05-07 MED ORDER — BACLOFEN 10 MG PO TABS: 10.0000 mg | ORAL_TABLET | Freq: Three times a day (TID) | ORAL | 0 refills | Status: AC

## 2022-05-07 NOTE — ED Triage Notes (Signed)
Slipped and fell on floor five days ago.  C/O back pain.  Seen at [facility] initially, given pain medication and muscle relaxer.  Arrives today c/o left lower back pain and left ankle pain.  AAOx3.  Skin warm and dry. NAD
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
## 2022-06-11 ENCOUNTER — Emergency Department: Payer: Medicaid Other

## 2022-06-11 ENCOUNTER — Other Ambulatory Visit: Payer: Self-pay

## 2022-06-11 ENCOUNTER — Emergency Department
Admission: EM | Admit: 2022-06-11 | Discharge: 2022-06-11 | Disposition: A | Discharge status: Home / Self Care | Payer: Medicaid Other | Attending: Emergency Medicine | Admitting: Emergency Medicine

## 2022-06-11 DIAGNOSIS — M25562 Pain in left knee: Secondary | ICD-10-CM | POA: Diagnosis not present

## 2022-06-11 DIAGNOSIS — W010XXA Fall on same level from slipping, tripping and stumbling without subsequent striking against object, initial encounter: Secondary | ICD-10-CM | POA: Diagnosis not present

## 2022-06-11 DIAGNOSIS — S99912A Unspecified injury of left ankle, initial encounter: Secondary | ICD-10-CM | POA: Diagnosis present

## 2022-06-11 DIAGNOSIS — Y92009 Unspecified place in unspecified non-institutional (private) residence as the place of occurrence of the external cause: Secondary | ICD-10-CM | POA: Insufficient documentation

## 2022-06-11 DIAGNOSIS — S93402A Sprain of unspecified ligament of left ankle, initial encounter: Secondary | ICD-10-CM | POA: Insufficient documentation

## 2022-06-11 DIAGNOSIS — W19XXXA Unspecified fall, initial encounter: Secondary | ICD-10-CM

## 2022-06-11 NOTE — Discharge Instructions (Addendum)
No signs of any fractures.  You can take Tylenol 1 g every 8 hours and ibuprofen 600 every 6-8 hours with food to help with pain and use the crutches to keep weight off of it and keep it elevated and use ice   IMPRESSION: 1. No acute fracture. 2. Small plantar calcaneal heel spur.

## 2022-06-11 NOTE — ED Triage Notes (Signed)
Pt to ED POV from home after mechanical fall last night, states all weight when fell was to L ankle and foot. Complains of L ankle and L knee pain. Full ROM to ankle.

## 2022-06-11 NOTE — ED Provider Notes (Signed)
[facility] Provider Note    Event Date/Time   First MD Initiated Contact with Patient 06/11/22 1335     (approximate)   History   Fall and Leg Injury   HPI  Christina Warner is a 27 y.o. female who comes in for mechanical fall last night where she fell onto her left ankle and foot.  Reports a mechanical fall where she slipped on plywood.  Denies hitting her head denies any chest pain, abdominal pain or any other issues.  She reports being concerned that she twisted her left knee and left ankle.  She reports pain in both of these.   Physical Exam   Triage Vital Signs: ED Triage Vitals [06/11/22 1243]  Enc Vitals Group     BP 126/86     Pulse Rate (!) 112     Resp 15     Temp 98.3 F (36.8 C)     Temp Source Oral     SpO2 98 %     Weight 172 lb (78 kg)     Height '5\' 1"'$  (1.549 m)     Head Circumference      Peak Flow      Pain Score 8     Pain Loc      Pain Edu?      Excl. in [place]?     Most recent vital signs: Vitals:   06/11/22 1243  BP: 126/86  Pulse: (!) 112  Resp: 15  Temp: 98.3 F (36.8 C)  SpO2: 98%     General: Awake, no distress.  CV:  Good peripheral perfusion.  Resp:  Normal effort.  Abd:  No distention.  Other:  No chest wall tenderness no abdominal tenderness.  Patient able to flex and extend the ankle.  No obvious swelling noted.  Some mild tenderness of her knee and her left ankle.  No bruising noted.  Sensation intact.   ED Results / Procedures / Treatments   Labs (all labs ordered are listed, but only abnormal results are displayed) Labs Reviewed - No data to display    RADIOLOGY I have reviewed the xray personally and interpreted and no evidence of any fractures   PROCEDURES:  Critical Care performed: No  Procedures   MEDICATIONS ORDERED IN ED: Medications - No data to display   IMPRESSION / MDM / [place] / ED COURSE  I reviewed the triage vital signs and the nursing  notes.   Patient's presentation is most consistent with acute, uncomplicated illness.  Ankle Most Likely DDx: Ankle sprain  DDx that was also considered d/t potential to cause harm, but was found less likely based on history and physical (as detailed above): -Ankle fracture- will use the [place] ankle rules to determine if xray is warranted -Unlikely Maisonneuve fracture given no tenderness at the proximal fibula -Aniceto Boss Fracture-no dorsal midfoot pain (if normal xray and pain here can get weightbearing view to look for joint widening to suggest ligament injury) -Ronnald Ramp fracture-no 5th metatarsal tenderness -Achilles tendon rupture, able to point toes down on their own  [place] Ankle/Foot Rules: Pain malleolar zone Pain midfoot zone Inability to bear weight immediately and in ED Bone tenderness posterior tip of edge of either malleolus within distal six cm. Bone tenderness base of fifth metatarsal Bone tenderness at navicular bone.    Xray ankle negative Foot negative Xray knee negative   Discussed with patient the possibility of a ligament injury and need to follow-up with  orthopedics outpatient.  She is slightly tachycardic when she first got here mostly related to pain.  Recheck was reassuring.  Recommended some ankle wraps but patient is requesting a boot due to having children he will just try to take the ankle wrap off.  Discussed with patient I do not want her to get her ankle tight and frozen from not moving it but she states that she will take it off and do exercises.  Patient given orthopedic number for follow-up and crutches and feels comfortable discharge home.  We discussed Tylenol ibuprofen.  She denies any concerns for pregnancy given she is currently on her period    FINAL CLINICAL IMPRESSION(S) / ED DIAGNOSES   Final diagnoses:  Fall, initial encounter  Sprain of left ankle, unspecified ligament, initial encounter  Acute pain of left knee     Rx / DC Orders    ED Discharge Orders     None        Note:  This document was prepared using Dragon voice recognition software and may include unintentional dictation errors.   Vanessa Michiana, MD 06/11/22 [phone]

## 2022-06-17 ENCOUNTER — Ambulatory Visit (INDEPENDENT_AMBULATORY_CARE_PROVIDER_SITE_OTHER): Payer: Medicaid Other | Admitting: Podiatry

## 2022-06-17 ENCOUNTER — Ambulatory Visit: Payer: Medicaid Other

## 2022-06-17 VITALS — BP 126/86

## 2022-06-17 DIAGNOSIS — M79672 Pain in left foot: Secondary | ICD-10-CM

## 2022-06-17 DIAGNOSIS — M7752 Other enthesopathy of left foot: Secondary | ICD-10-CM

## 2022-06-17 MED ORDER — MELOXICAM 15 MG PO TABS: 15.0000 mg | ORAL_TABLET | Freq: Every day | ORAL | 0 refills | Status: DC | PRN

## 2022-06-17 NOTE — Progress Notes (Signed)
Subjective:   Patient ID: Christina Warner, female   DOB: 27 y.o.   MRN: [id number]   HPI Chief Complaint  Patient presents with   Foot Injury    Patient came in today for a left foot and ankle injury, patient fell on the 27th, patient was seen at the ER on the 28th, patient is having ankle pain and swelling, rate of pain 8 out of 10, TX; Cam boot, X-rays done at the ER   DOI: 05/21/2022 MOI: Walking and she slipped on a piece of plywood- remembers landing on top of it and hear something snap, not sure it was the wood that broke or her foot.   She went to the ER the next day and has increased pain and swelling since.  She is nonweightbearing in the cam boot.  Tobacco use - 1ppd Occasional Etoh use.  Review of Systems  All other systems reviewed and are negative.  Past Medical History:  Diagnosis Date   ADHD (attention deficit hyperactivity disorder)    Asthma    Chlamydia    Depression    UTI (urinary tract infection)     Past Surgical History:  Procedure Laterality Date   TYMPANOSTOMY TUBE PLACEMENT     WISDOM TOOTH EXTRACTION       Current Outpatient Medications:    meloxicam (MOBIC) 15 MG tablet, Take 1 tablet (15 mg total) by mouth daily as needed for pain., Disp: 30 tablet, Rfl: 0   acetaminophen (TYLENOL) 325 MG tablet, Take 2 tablets (650 mg total) by mouth every 4 (four) hours as needed (for pain scale < 4)., Disp: 30 tablet, Rfl: 1   albuterol (VENTOLIN HFA) 108 (90 Base) MCG/ACT inhaler, Inhale 1-2 puffs into the lungs every 6 (six) hours as needed for wheezing or shortness of breath., Disp: 1 each, Rfl: 0   meloxicam (MOBIC) 15 MG tablet, Take 1 tablet (15 mg total) by mouth daily., Disp: 30 tablet, Rfl: 2   traMADol (ULTRAM) 50 MG tablet, Take 1 tablet (50 mg total) by mouth every 6 (six) hours as needed., Disp: 15 tablet, Rfl: 0 No current facility-administered medications for this visit.  Facility-Administered Medications Ordered in Other Visits:    lidocaine (PF)  (XYLOCAINE) 1 % injection, , , Anesthesia Intra-op, Lyndle Herrlich, MD, 6 mL at 05/31/14 2144  Allergies  Allergen Reactions   Codeine Other (See Comments)    Vomiting and headaches          Objective:  Physical Exam  General: AAO x3, NAD  Dermatological: Skin is warm, dry and supple bilateral. There are no open sores, no preulcerative lesions, no rash or signs of infection present.  Vascular: Dorsalis Pedis artery and Posterior Tibial artery pedal pulses are 2/4 bilateral with immedate capillary fill time.  There is no pain with calf compression, swelling, warmth, erythema.   Neruologic: Grossly intact via light touch bilateral.   Musculoskeletal: There is tenderness palpation of the left ankle mostly on the lateral aspect of the lateral ankle complex.  There is tenderness in the peroneal tendon.  There is no pinpoint tenderness noted.  There is edema to the foot is no erythema or warmth.  Gait: Unassisted, Nonantalgic.       Assessment:   Left ankle sprain      Plan:  -Treatment options discussed including all alternatives, risks, and complications -Etiology of symptoms were discussed -Reviewed the x-rays from the emergency room. -Unna boot applied for compression to help with swelling. -Prescribed mobic.  Discussed side effects of the medication and directed to stop if any are to occur and call the office.  -Continue boot for now she can transition to weightbearing as tolerated once the pain improves -MRI if no improvement   Trula Slade DPM

## 2022-06-24 ENCOUNTER — Telehealth: Payer: Self-pay | Admitting: Podiatry

## 2022-06-24 ENCOUNTER — Other Ambulatory Visit: Payer: Self-pay | Admitting: Podiatry

## 2022-06-24 MED ORDER — TRAMADOL HCL 50 MG PO TABS: 50.0000 mg | ORAL_TABLET | Freq: Four times a day (QID) | ORAL | 0 refills | Status: DC | PRN

## 2022-06-24 NOTE — Telephone Encounter (Signed)
Pt called in to request a Rx for pain, stated that the medicine she is currently taking is not helping. She also stated her left ankle is swollen. Please advise

## 2022-06-25 NOTE — Telephone Encounter (Signed)
Pt called back today to see what Dr Jacqualyn Posey said about calling something in for pain for her.  Upon checking it looks like he called in tramadol and I notified pt and she said thank you.

## 2022-07-04 ENCOUNTER — Ambulatory Visit (INDEPENDENT_AMBULATORY_CARE_PROVIDER_SITE_OTHER): Payer: Medicaid Other | Admitting: Podiatry

## 2022-07-04 DIAGNOSIS — S96912D Strain of unspecified muscle and tendon at ankle and foot level, left foot, subsequent encounter: Secondary | ICD-10-CM | POA: Diagnosis not present

## 2022-07-04 MED ORDER — HYDROCODONE-ACETAMINOPHEN 5-325 MG PO TABS: 1.0000 | ORAL_TABLET | Freq: Four times a day (QID) | ORAL | 0 refills | Status: DC | PRN

## 2022-07-06 NOTE — Progress Notes (Signed)
Subjective: Chief Complaint  Patient presents with   2 Week Follow-up    Pain in the left foot, pain level is at a 5/6, pain worse at night, pain located in the ankle area    27 year old female presents with above concerns.  She states that she still having discomfort in the left ankle she is still using the cam boot and crutches as she is not able to put full weight on her foot still.  The swelling is somewhat improved however.  No recent injury or change otherwise.  DOI: 05/21/2022 MOI: Walking and she slipped on a piece of plywood- remembers landing on top of it and hear something snap, not sure it was the wood that broke or her foot.     Objective: AAO x3, NAD DP/PT pulses palpable bilaterally, CRT less than 3 seconds Tenderness palpation still noted along the left ankle on the lateral ankle complex as well as on the course the peroneal tendon.  There is no specific area pinpoint tenderness.  There is still edema present there is no erythema or warmth.  The swelling is improved.  No proximal tib-fib pain.  No pain to the foot. No pain with calf compression, swelling, warmth, erythema  Assessment: Ankle sprain, rule out ligament, tendon tear  Plan: -All treatment options discussed with the patient including all alternatives, risks, complications.  -Given her ongoing nature of symptoms and we will order an MRI as she is still having discomfort.  She is attempted conservative treatment without significant improvement given the injury led to the MRI to further evaluate and options. -Ice, elevate as well as compression.  To continue cam boot for now.  She can transition to weight-bear as tolerated in cam boot as able. -Patient encouraged to call the office with any questions, concerns, change in symptoms.    Trula Slade DPM

## 2022-07-14 ENCOUNTER — Other Ambulatory Visit: Payer: Self-pay | Admitting: Podiatry

## 2022-07-14 ENCOUNTER — Telehealth: Payer: Self-pay | Admitting: Podiatry

## 2022-07-14 MED ORDER — HYDROCODONE-ACETAMINOPHEN 5-325 MG PO TABS: 1.0000 | ORAL_TABLET | Freq: Four times a day (QID) | ORAL | 0 refills | Status: DC | PRN

## 2022-07-14 NOTE — Telephone Encounter (Signed)
Pt hurt her foot trying to get her dogs off her pigs and now Ankle is now swollen and wants a refill on HYDROcodone-acetaminophen (NORCO/VICODIN) 5-325 MG table   Please advise

## 2022-07-14 NOTE — Telephone Encounter (Signed)
Pt called again inquiring about her Rx for pain medication.   Please advise

## 2022-07-14 NOTE — Telephone Encounter (Signed)
Pt called again to inquire about the Rx request for pain. Please advise

## 2022-07-16 ENCOUNTER — Encounter (HOSPITAL_COMMUNITY): Payer: Self-pay

## 2022-07-16 ENCOUNTER — Ambulatory Visit (HOSPITAL_COMMUNITY)
Admission: EM | Admit: 2022-07-16 | Discharge: 2022-07-16 | Disposition: A | Discharge status: Home / Self Care | Payer: Medicaid Other | Attending: Emergency Medicine | Admitting: Emergency Medicine

## 2022-07-16 DIAGNOSIS — J020 Streptococcal pharyngitis: Secondary | ICD-10-CM | POA: Diagnosis not present

## 2022-07-16 DIAGNOSIS — J45901 Unspecified asthma with (acute) exacerbation: Secondary | ICD-10-CM

## 2022-07-16 LAB — POCT RAPID STREP A, ED / UC: Streptococcus, Group A Screen (Direct): POSITIVE — AB

## 2022-07-16 MED ORDER — PROMETHAZINE-DM 6.25-15 MG/5ML PO SYRP: 5.0000 mL | ORAL_SOLUTION | Freq: Three times a day (TID) | ORAL | 0 refills | Status: DC | PRN

## 2022-07-16 MED ORDER — PREDNISONE 20 MG PO TABS: 40.0000 mg | ORAL_TABLET | Freq: Every day | ORAL | 0 refills | Status: AC

## 2022-07-16 MED ORDER — AMOXICILLIN 500 MG PO CAPS: 500.0000 mg | ORAL_CAPSULE | Freq: Two times a day (BID) | ORAL | 0 refills | Status: AC

## 2022-07-16 NOTE — Discharge Instructions (Signed)
Your rapid strep test was positive today in clinic, I am covering you with amoxicillin twice daily for the next 10 days.  Please take all antibiotics until finished, you can take them with food to help prevent GI upset.  Please take the steroids starting today and then once daily with breakfast.  He can do the cough syrup at night to help with your cough, do not drink or drive on this medication as it may make you drowsy.  For symptomatic relief of your sore throat you can do warm saline gargles, tea with honey, sleep with a humidifier, and over-the-counter cough drops.  You can alternate between Tylenol and ibuprofen every 4-6 hours as needed for fever, aches and pains and discomfort.  Please follow-up with the clinic or seek immediate care if you develop trouble swallowing, drooling, or no improvement over the next 72 hours with the antibiotics.  Please call [facility] to get established with a primary care provider.

## 2022-07-16 NOTE — ED Triage Notes (Signed)
Patient reports that she developed a productive cough with green sputum, sore throat, bilateral ear pain, and a yellow coating on her tongue yesterday.  Patient denies taking any medications for her symptoms.
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
## 2022-07-25 ENCOUNTER — Other Ambulatory Visit: Payer: Self-pay

## 2022-07-25 ENCOUNTER — Encounter: Payer: Self-pay | Admitting: Podiatry

## 2022-07-25 MED ORDER — HYDROCODONE-ACETAMINOPHEN 5-325 MG PO TABS: 1.0000 | ORAL_TABLET | Freq: Four times a day (QID) | ORAL | 0 refills | Status: DC | PRN

## 2022-07-28 ENCOUNTER — Other Ambulatory Visit: Payer: Medicaid Other

## 2022-08-05 ENCOUNTER — Telehealth: Payer: Self-pay | Admitting: Podiatry

## 2022-08-05 ENCOUNTER — Other Ambulatory Visit: Payer: Self-pay | Admitting: Podiatry

## 2022-08-05 MED ORDER — HYDROCODONE-ACETAMINOPHEN 5-325 MG PO TABS: 1.0000 | ORAL_TABLET | Freq: Four times a day (QID) | ORAL | 0 refills | Status: DC | PRN

## 2022-08-05 NOTE — Telephone Encounter (Signed)
Pt stated that she is still in pain & requested a Rx refill for pain medicine. Please advise.

## 2022-08-11 ENCOUNTER — Telehealth: Payer: Self-pay | Admitting: Podiatry

## 2022-08-11 ENCOUNTER — Other Ambulatory Visit: Payer: Self-pay | Admitting: Podiatry

## 2022-08-11 MED ORDER — HYDROCODONE-ACETAMINOPHEN 5-325 MG PO TABS: 1.0000 | ORAL_TABLET | Freq: Four times a day (QID) | ORAL | 0 refills | Status: DC | PRN

## 2022-08-11 NOTE — Telephone Encounter (Signed)
Pt asking for a refill for Hydrocodone for pain   Please advise.

## 2022-08-11 NOTE — Telephone Encounter (Signed)
Pt called again checking to see if Dr Ardelle Anton called in any pain medication for her.   I checked and did explain he is in clinic and we will let her know when it gets sent in.

## 2022-08-12 NOTE — Telephone Encounter (Signed)
Dr Ardelle Anton sent the rx in after hours.

## 2022-09-09 ENCOUNTER — Other Ambulatory Visit: Payer: Self-pay | Admitting: Podiatry

## 2022-09-09 ENCOUNTER — Telehealth: Payer: Self-pay | Admitting: Podiatry

## 2022-09-09 MED ORDER — HYDROCODONE-ACETAMINOPHEN 5-325 MG PO TABS: 1.0000 | ORAL_TABLET | Freq: Four times a day (QID) | ORAL | 0 refills | Status: DC | PRN

## 2022-09-09 NOTE — Telephone Encounter (Signed)
Pt called asking if she could get a refill on her pain mediation.  She also wanted you to know she is still trying to get her insurance to cover the mri, they keep giving her the runaround.

## 2022-09-19 ENCOUNTER — Other Ambulatory Visit: Payer: Self-pay | Admitting: Podiatry

## 2022-09-19 ENCOUNTER — Telehealth: Payer: Self-pay | Admitting: Podiatry

## 2022-09-19 MED ORDER — HYDROCODONE-ACETAMINOPHEN 5-325 MG PO TABS: 1.0000 | ORAL_TABLET | Freq: Four times a day (QID) | ORAL | 0 refills | Status: DC | PRN

## 2022-09-19 NOTE — Telephone Encounter (Signed)
Called pt to get scheduled for a follow up and she has still not been able to get the mri due to insurance issuesM[employer] keeps giving her the runaround). She is scheduled to see you on 7.8. She is asking if you could refill her pain medication.

## 2022-09-22 ENCOUNTER — Ambulatory Visit (INDEPENDENT_AMBULATORY_CARE_PROVIDER_SITE_OTHER): Payer: Medicaid Other | Admitting: Clinical

## 2022-09-22 ENCOUNTER — Ambulatory Visit (INDEPENDENT_AMBULATORY_CARE_PROVIDER_SITE_OTHER): Payer: Medicaid Other | Admitting: Psychiatry

## 2022-09-22 ENCOUNTER — Encounter (HOSPITAL_COMMUNITY): Payer: Self-pay | Admitting: Psychiatry

## 2022-09-22 ENCOUNTER — Encounter (HOSPITAL_COMMUNITY): Payer: Self-pay

## 2022-09-22 VITALS — BP 114/80 | HR 75 | Temp 98.3°F | Wt 189.6 lb

## 2022-09-22 DIAGNOSIS — F411 Generalized anxiety disorder: Secondary | ICD-10-CM

## 2022-09-22 DIAGNOSIS — F332 Major depressive disorder, recurrent severe without psychotic features: Secondary | ICD-10-CM

## 2022-09-22 DIAGNOSIS — F331 Major depressive disorder, recurrent, moderate: Secondary | ICD-10-CM

## 2022-09-22 MED ORDER — TRAZODONE HCL 50 MG PO TABS
50.0000 mg | ORAL_TABLET | Freq: Every day | ORAL | 3 refills | Status: DC
Start: 2022-09-22 — End: 2022-10-30

## 2022-09-22 MED ORDER — ARIPIPRAZOLE 5 MG PO TABS
5.0000 mg | ORAL_TABLET | Freq: Every day | ORAL | 3 refills | Status: DC
Start: 2022-09-22 — End: 2022-10-30

## 2022-09-22 MED ORDER — HYDROXYZINE HCL 10 MG PO TABS
10.0000 mg | ORAL_TABLET | Freq: Three times a day (TID) | ORAL | 3 refills | Status: DC | PRN
Start: 2022-09-22 — End: 2022-10-30
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
## 2022-09-24 ENCOUNTER — Telehealth (HOSPITAL_COMMUNITY): Payer: Self-pay | Admitting: *Deleted

## 2022-09-24 NOTE — Telephone Encounter (Signed)
Fax received for prior authorization of Aripiprazole 5mg . Submitted online with cover my meds. Awaiting decision.

## 2022-10-01 ENCOUNTER — Other Ambulatory Visit: Payer: Self-pay | Admitting: Podiatry

## 2022-10-01 ENCOUNTER — Telehealth: Payer: Self-pay | Admitting: Podiatry

## 2022-10-01 MED ORDER — HYDROCODONE-ACETAMINOPHEN 5-325 MG PO TABS: 1.0000 | ORAL_TABLET | Freq: Four times a day (QID) | ORAL | 0 refills | Status: DC | PRN

## 2022-10-01 NOTE — Telephone Encounter (Signed)
Pt called requesting Rx for pain.    Please advise

## 2022-10-09 ENCOUNTER — Other Ambulatory Visit: Payer: Self-pay | Admitting: Podiatry

## 2022-10-09 MED ORDER — HYDROCODONE-ACETAMINOPHEN 5-325 MG PO TABS: 1.0000 | ORAL_TABLET | Freq: Four times a day (QID) | ORAL | 0 refills | Status: DC | PRN

## 2022-10-20 ENCOUNTER — Ambulatory Visit: Payer: Medicaid Other

## 2022-10-20 ENCOUNTER — Encounter: Payer: Self-pay | Admitting: Podiatry

## 2022-10-20 ENCOUNTER — Ambulatory Visit (INDEPENDENT_AMBULATORY_CARE_PROVIDER_SITE_OTHER): Payer: Medicaid Other | Admitting: Podiatry

## 2022-10-20 DIAGNOSIS — S96912D Strain of unspecified muscle and tendon at ankle and foot level, left foot, subsequent encounter: Secondary | ICD-10-CM | POA: Diagnosis not present

## 2022-10-20 DIAGNOSIS — M7752 Other enthesopathy of left foot: Secondary | ICD-10-CM

## 2022-10-20 MED ORDER — HYDROCODONE-ACETAMINOPHEN 5-325 MG PO TABS: 1.0000 | ORAL_TABLET | Freq: Four times a day (QID) | ORAL | 0 refills | Status: DC | PRN

## 2022-10-20 MED ORDER — METHYLPREDNISOLONE 4 MG PO TBPK: ORAL_TABLET | ORAL | 0 refills | Status: DC

## 2022-10-20 NOTE — Progress Notes (Signed)
Subjective: Chief Complaint  Patient presents with   Ankle Pain    Follow up ankle sprain/tear left   "It actually feels worse. My toes are now numb and the swelling hasn't gotten any better"   27 year old female presents the office for above concerns.  She feels that her ankle is getting worse and she is swelling and associated numbness in the toes.  She states that has not been getting better and she is not able to do activities with her kids because of the pain.  She stopped wearing the cam boot.  No new injuries.  Objective: AAO x3, NAD DP/PT pulses palpable bilaterally, CRT less than 3 seconds Joint tenderness is localized to the lateral aspect of the left ankle on the lateral ankle complex, ATFL there is discomfort along the peroneal tendon as well as the posterior tibial tendon.  Achilles tendon appears to be intact.  There is mild edema to the ankle there is no erythema or warmth. No pain with calf compression, swelling, warmth, erythema  Assessment: 27 year old female ankle sprain, concern for tendon tear  Plan: -All treatment options discussed with the patient including all alternatives, risks, complications.  -Unfortunately after the MRI and have reordered this for her today.  She has had treatment now for greater than 6 weeks without any significant improvement despite conservative treatment.  Continue immobilization.  She been using pain medication discussed weaning off narcotics and hopefully we can switch to anti-inflammatories, Tylenol. -Patient encouraged to call the office with any questions, concerns, change in symptoms.   Vivi Barrack DPM

## 2022-10-20 NOTE — Patient Instructions (Signed)
For instructions on how to put on your Tri-Lock Ankle Brace, please visit [email or web address] 

## 2022-10-23 ENCOUNTER — Ambulatory Visit (HOSPITAL_COMMUNITY): Payer: Medicaid Other

## 2022-10-27 ENCOUNTER — Telehealth: Payer: Self-pay | Admitting: Podiatry

## 2022-10-27 NOTE — Telephone Encounter (Signed)
Just a FYI note:  Called [facility] to confirm they do insurance pre-auth's for the MRI's.  They do.  I faxed over the MRI order and last note from Dr. Ardelle Anton so that they can get the MRI pre-authorized and scheduled for the patient at their [facility] office.    It was faxed to [phone] (fax number was confirmed when I called to confirm they obtain the prior auth's).  -Dr. Burna Mortimer

## 2022-10-30 ENCOUNTER — Encounter (HOSPITAL_COMMUNITY): Payer: Self-pay

## 2022-10-30 ENCOUNTER — Encounter (HOSPITAL_COMMUNITY): Payer: Self-pay | Admitting: Student

## 2022-10-30 ENCOUNTER — Ambulatory Visit (INDEPENDENT_AMBULATORY_CARE_PROVIDER_SITE_OTHER): Payer: Medicaid Other | Admitting: Student

## 2022-10-30 DIAGNOSIS — F331 Major depressive disorder, recurrent, moderate: Secondary | ICD-10-CM

## 2022-10-30 DIAGNOSIS — F17200 Nicotine dependence, unspecified, uncomplicated: Secondary | ICD-10-CM | POA: Diagnosis not present

## 2022-10-30 DIAGNOSIS — Z758 Other problems related to medical facilities and other health care: Secondary | ICD-10-CM

## 2022-10-30 DIAGNOSIS — F411 Generalized anxiety disorder: Secondary | ICD-10-CM | POA: Insufficient documentation

## 2022-10-30 DIAGNOSIS — F4310 Post-traumatic stress disorder, unspecified: Secondary | ICD-10-CM

## 2022-10-30 MED ORDER — TRAZODONE HCL 50 MG PO TABS: 50.0000 mg | ORAL_TABLET | Freq: Every day | ORAL | 3 refills | Status: DC

## 2022-10-30 MED ORDER — ARIPIPRAZOLE 5 MG PO TABS
5.0000 mg | ORAL_TABLET | Freq: Every day | ORAL | 3 refills | Status: DC
Start: 2022-10-30 — End: 2022-12-02

## 2022-10-30 MED ORDER — FLUOXETINE HCL 10 MG PO CAPS
10.0000 mg | ORAL_CAPSULE | Freq: Every day | ORAL | 0 refills | Status: DC
Start: 2022-10-30 — End: 2022-12-02

## 2022-10-30 MED ORDER — HYDROXYZINE HCL 10 MG PO TABS: 10.0000 mg | ORAL_TABLET | Freq: Three times a day (TID) | ORAL | 3 refills | Status: DC | PRN
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
## 2022-10-30 NOTE — Patient Instructions (Addendum)
[facility] Phone: [phone] Address: [address], [place], [place] [address] Hours: Monday to Friday; 8:30AM - 5PM  

## 2022-11-11 ENCOUNTER — Ambulatory Visit (HOSPITAL_COMMUNITY): Payer: Medicaid Other | Admitting: Clinical

## 2022-11-16 ENCOUNTER — Other Ambulatory Visit: Payer: Medicaid Other

## 2022-11-24 ENCOUNTER — Ambulatory Visit (INDEPENDENT_AMBULATORY_CARE_PROVIDER_SITE_OTHER): Payer: Medicaid Other | Admitting: Clinical

## 2022-11-24 DIAGNOSIS — F331 Major depressive disorder, recurrent, moderate: Secondary | ICD-10-CM
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
## 2022-12-01 ENCOUNTER — Other Ambulatory Visit (HOSPITAL_COMMUNITY): Payer: Medicaid Other

## 2022-12-02 ENCOUNTER — Encounter (HOSPITAL_COMMUNITY): Payer: Self-pay | Admitting: Student

## 2022-12-02 ENCOUNTER — Ambulatory Visit (INDEPENDENT_AMBULATORY_CARE_PROVIDER_SITE_OTHER): Payer: Medicaid Other | Admitting: Student

## 2022-12-02 VITALS — BP 109/76 | HR 72 | Ht 60.63 in | Wt 195.0 lb

## 2022-12-02 DIAGNOSIS — F17200 Nicotine dependence, unspecified, uncomplicated: Secondary | ICD-10-CM | POA: Diagnosis not present

## 2022-12-02 DIAGNOSIS — F4310 Post-traumatic stress disorder, unspecified: Secondary | ICD-10-CM | POA: Diagnosis not present

## 2022-12-02 DIAGNOSIS — F331 Major depressive disorder, recurrent, moderate: Secondary | ICD-10-CM | POA: Diagnosis not present

## 2022-12-02 MED ORDER — HYDROXYZINE HCL 25 MG PO TABS
25.0000 mg | ORAL_TABLET | Freq: Three times a day (TID) | ORAL | 1 refills | Status: DC | PRN
Start: 2022-12-02 — End: 2022-12-30

## 2022-12-02 MED ORDER — TRAZODONE HCL 50 MG PO TABS
50.0000 mg | ORAL_TABLET | Freq: Every day | ORAL | 1 refills | Status: DC
Start: 2022-12-02 — End: 2022-12-30

## 2022-12-02 MED ORDER — FLUOXETINE HCL 20 MG PO CAPS: 20.0000 mg | ORAL_CAPSULE | Freq: Every day | ORAL | 1 refills | Status: DC

## 2022-12-02 MED ORDER — ARIPIPRAZOLE 5 MG PO TABS
5.0000 mg | ORAL_TABLET | Freq: Every day | ORAL | 1 refills | Status: DC
Start: 2022-12-02 — End: 2022-12-30
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
## 2022-12-05 NOTE — Addendum Note (Signed)
Addended by: Theodoro Kos A on: 12/05/2022 01:28 PM   Modules accepted: Level of Service

## 2022-12-11 ENCOUNTER — Ambulatory Visit (HOSPITAL_COMMUNITY): Payer: Medicaid Other | Admitting: Clinical

## 2022-12-29 ENCOUNTER — Other Ambulatory Visit (HOSPITAL_COMMUNITY): Payer: Medicaid Other

## 2022-12-30 ENCOUNTER — Ambulatory Visit (INDEPENDENT_AMBULATORY_CARE_PROVIDER_SITE_OTHER): Payer: Medicaid Other | Admitting: Student

## 2022-12-30 ENCOUNTER — Encounter (HOSPITAL_COMMUNITY): Payer: Self-pay | Admitting: Student

## 2022-12-30 DIAGNOSIS — F331 Major depressive disorder, recurrent, moderate: Secondary | ICD-10-CM

## 2022-12-30 DIAGNOSIS — F4310 Post-traumatic stress disorder, unspecified: Secondary | ICD-10-CM | POA: Diagnosis not present

## 2022-12-30 DIAGNOSIS — F17200 Nicotine dependence, unspecified, uncomplicated: Secondary | ICD-10-CM | POA: Diagnosis not present

## 2022-12-30 DIAGNOSIS — F411 Generalized anxiety disorder: Secondary | ICD-10-CM

## 2022-12-30 MED ORDER — FLUOXETINE HCL 20 MG PO CAPS
20.0000 mg | ORAL_CAPSULE | Freq: Every day | ORAL | 1 refills | Status: DC
Start: 2022-12-30 — End: 2023-12-07

## 2022-12-30 MED ORDER — ARIPIPRAZOLE 5 MG PO TABS
5.0000 mg | ORAL_TABLET | Freq: Every day | ORAL | 1 refills | Status: DC
Start: 2022-12-30 — End: 2023-12-07

## 2022-12-30 MED ORDER — GABAPENTIN 100 MG PO CAPS: 100.0000 mg | ORAL_CAPSULE | Freq: Two times a day (BID) | ORAL | 0 refills | Status: DC | PRN

## 2022-12-30 MED ORDER — TRAZODONE HCL 50 MG PO TABS
50.0000 mg | ORAL_TABLET | Freq: Every day | ORAL | 1 refills | Status: DC
Start: 2022-12-30 — End: 2023-12-07
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
## 2023-01-01 ENCOUNTER — Ambulatory Visit (HOSPITAL_COMMUNITY): Payer: Medicaid Other | Admitting: Clinical

## 2023-01-08 ENCOUNTER — Ambulatory Visit (HOSPITAL_COMMUNITY): Payer: Medicaid Other | Admitting: Clinical

## 2023-01-08 ENCOUNTER — Telehealth (HOSPITAL_COMMUNITY): Payer: Self-pay | Admitting: Clinical

## 2023-01-08 ENCOUNTER — Encounter (HOSPITAL_COMMUNITY): Payer: Self-pay

## 2023-01-08 NOTE — Telephone Encounter (Signed)
Therapist sent the client a link for the scheduled virtual therapy visit. Client did not check in using the link. Therapist called the client via telephone. Client did not answer.

## 2023-01-25 ENCOUNTER — Ambulatory Visit (HOSPITAL_COMMUNITY)
Admission: RE | Admit: 2023-01-25 | Discharge: 2023-01-25 | Disposition: A | Discharge status: Home / Self Care | Payer: Medicaid Other | Source: Ambulatory Visit | Attending: Emergency Medicine | Admitting: Emergency Medicine

## 2023-01-25 ENCOUNTER — Encounter (HOSPITAL_COMMUNITY): Payer: Self-pay

## 2023-01-25 ENCOUNTER — Other Ambulatory Visit: Payer: Self-pay

## 2023-01-25 VITALS — BP 110/73 | HR 92 | Temp 98.2°F | Resp 20

## 2023-01-25 DIAGNOSIS — N3000 Acute cystitis without hematuria: Secondary | ICD-10-CM | POA: Diagnosis not present

## 2023-01-25 DIAGNOSIS — M549 Dorsalgia, unspecified: Secondary | ICD-10-CM | POA: Diagnosis present

## 2023-01-25 LAB — POCT URINALYSIS DIP (MANUAL ENTRY)
Bilirubin, UA: NEGATIVE
Blood, UA: NEGATIVE
Glucose, UA: NEGATIVE mg/dL
Ketones, POC UA: NEGATIVE mg/dL
Nitrite, UA: POSITIVE — AB
Protein Ur, POC: NEGATIVE mg/dL
Spec Grav, UA: 1.02 (ref 1.010–1.025)
Urobilinogen, UA: 0.2 U/dL
pH, UA: 6 (ref 5.0–8.0)

## 2023-01-25 LAB — POCT URINE PREGNANCY: Preg Test, Ur: NEGATIVE

## 2023-01-25 MED ORDER — NAPROXEN 500 MG PO TABS: 500.0000 mg | ORAL_TABLET | Freq: Two times a day (BID) | ORAL | 0 refills | Status: DC | PRN

## 2023-01-25 MED ORDER — CEPHALEXIN 500 MG PO CAPS: 500.0000 mg | ORAL_CAPSULE | Freq: Two times a day (BID) | ORAL | 0 refills | Status: AC

## 2023-01-25 NOTE — ED Notes (Signed)
Pt unable to provide urine sample at this time. PO fluids provided.
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
## 2023-01-27 LAB — URINE CULTURE: Culture: >=100000 — AB

## 2023-02-05 ENCOUNTER — Ambulatory Visit (HOSPITAL_COMMUNITY): Payer: Medicaid Other | Admitting: Clinical

## 2023-02-05 ENCOUNTER — Encounter (HOSPITAL_COMMUNITY): Payer: Self-pay

## 2023-02-17 ENCOUNTER — Encounter (HOSPITAL_COMMUNITY): Payer: Medicaid Other | Admitting: Student

## 2023-02-18 ENCOUNTER — Ambulatory Visit (HOSPITAL_COMMUNITY): Payer: Medicaid Other | Admitting: Clinical

## 2023-02-18 ENCOUNTER — Encounter (HOSPITAL_COMMUNITY): Payer: Self-pay

## 2023-03-04 ENCOUNTER — Encounter (HOSPITAL_COMMUNITY): Payer: Medicaid Other | Admitting: Student

## 2023-03-27 ENCOUNTER — Encounter (HOSPITAL_COMMUNITY): Payer: Medicaid Other | Admitting: Student

## 2023-04-30 ENCOUNTER — Encounter (HOSPITAL_COMMUNITY): Payer: Self-pay

## 2023-04-30 ENCOUNTER — Ambulatory Visit (HOSPITAL_COMMUNITY)
Admission: RE | Admit: 2023-04-30 | Discharge: 2023-04-30 | Disposition: A | Discharge status: Home / Self Care | Payer: Medicaid Other | Source: Ambulatory Visit | Attending: Family Medicine | Admitting: Family Medicine

## 2023-04-30 VITALS — BP 111/76 | HR 118 | Temp 98.4°F | Resp 18 | Ht 61.0 in | Wt 200.0 lb

## 2023-04-30 DIAGNOSIS — J209 Acute bronchitis, unspecified: Secondary | ICD-10-CM | POA: Diagnosis not present

## 2023-04-30 DIAGNOSIS — N3001 Acute cystitis with hematuria: Secondary | ICD-10-CM

## 2023-04-30 LAB — POC COVID19/FLU A&B COMBO
Covid Antigen, POC: NEGATIVE
Influenza A Antigen, POC: NEGATIVE
Influenza B Antigen, POC: NEGATIVE

## 2023-04-30 LAB — POCT URINE PREGNANCY: Preg Test, Ur: NEGATIVE

## 2023-04-30 LAB — POCT URINALYSIS DIP (MANUAL ENTRY)
Glucose, UA: NEGATIVE mg/dL
Ketones, POC UA: NEGATIVE mg/dL
Nitrite, UA: POSITIVE — AB
Protein Ur, POC: 100 mg/dL — AB
Spec Grav, UA: >=1.03 — AB (ref 1.010–1.025)
Urobilinogen, UA: 1 U/dL
pH, UA: 5.5 (ref 5.0–8.0)

## 2023-04-30 MED ORDER — PROMETHAZINE-DM 6.25-15 MG/5ML PO SYRP: 5.0000 mL | ORAL_SOLUTION | Freq: Every evening | ORAL | 0 refills | Status: DC | PRN

## 2023-04-30 MED ORDER — AMOXICILLIN-POT CLAVULANATE 875-125 MG PO TABS: 1.0000 | ORAL_TABLET | Freq: Two times a day (BID) | ORAL | 0 refills | Status: DC

## 2023-04-30 MED ORDER — GUAIFENESIN 400 MG PO TABS: ORAL_TABLET | ORAL | 0 refills | Status: DC

## 2023-04-30 MED ORDER — METHYLPREDNISOLONE 4 MG PO TBPK: ORAL_TABLET | ORAL | 0 refills | Status: DC
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
## 2023-04-30 NOTE — ED Triage Notes (Signed)
Cough productive with green and yellow mucus, runny nose, fever, sweats x3 days. Patient's significant other was sick first but was not diagnosed with anything.   Patient has not taken any cold medications.   UTI symptoms for a few weeks. Patient having burning, malodor urine.

## 2023-04-30 NOTE — Discharge Instructions (Addendum)
Your rapid influenza antigen test today was negative.  No further influenza testing is indicated.  Your COVID-19 test is negative.  Please consider retesting in the next 2 to 3 days, particularly if you are not feeling any better.  You are welcome to return here to urgent care to have it done or you can take a home COVID-19 test.  If both your COVID-19 tests are negative, then you can safely assume that your illness is due to one of the many less serious illnesses circulating in our community right now.     Conservative care is recommended with rest, drinking plenty of clear fluids, eating only when hungry, taking supportive medications for your symptoms and avoiding being around other people.  Please remain at home until you are fever free for 24 hours without the use of antifever medications such as Tylenol and ibuprofen.   Please read below to learn more about the medications, dosages and frequencies that I recommend to help alleviate your symptoms and to get you feeling better soon:   Augmentin (amoxicillin - clavulanic acid):  Please take one (1) dose twice daily for 7 days.  This antibiotic can cause upset stomach, this will resolve once antibiotics are complete.  You are welcome to take a probiotic, eat yogurt, take Imodium while taking this medication.  Please avoid other systemic medications such as Maalox, Pepto-Bismol or milk of magnesia as they can interfere with the body's ability to absorb the antibiotics.    Medrol Dosepak (methylprednisolone): This is a steroid that will significantly calm your upper and lower airways.  Please take one row of tablets daily with your breakfast meal starting tomorrow morning until the prescription is complete.      ProAir, Ventolin, Proventil (albuterol): This inhaled medication contains a short acting beta agonist bronchodilator.  This medication relaxes the smooth muscle of the airway in the lungs.  When these muscles are tight, breathing becomes more  constricted.  The result of relaxation of the smooth muscle is increased air movement and improved work of breathing.  This is a short acting medication that can be used every 4-6 hours as needed for increased work of breathing, shortness of breath, wheezing and excessive coughing.  It comes in the form of a handheld inhaler or nebulizer solution.  I recommended that for the next 3 to 4 days, this medication is used 4 times daily on a scheduled basis then decrease to twice daily and as needed until symptoms have completely resolved which I anticipate will be several weeks.   Robitussin, Mucinex (guaifenesin): This is a daytime expectorant.  This single symptom reliever helps break up chest congestion and loosen up thick nasal drainage making phlegm and drainage easier to cough up and to blow out from your nose.  I recommend taking 400 mg in either liquid or tablet form three times daily as needed.  I do not recommend the 12-hour extended relief version or doses higher than 400 mg per each dose as these often make some patients feel jittery or jumpy and can interfere with sleep.  I also do not recommend that you purchase guaifenesin with the ingredient " DM" which is dextromethorphan, a cough suppressant which I only recommend taking at bedtime.  Guaifenesin 400 mg is a safe dose for people who are being treated for high blood pressure.     Promethazine DM: Promethazine is both a nasal decongestant that dries up mucous membranes and an antinausea medication.  Promethazine often makes most patients  feel fairly sleepy.  "DM" is dextromethorphan, a single symptom reliever which is a cough suppressant found in many over-the-counter cough medications and combination cold preparations.  Please take 5 mL before bedtime to minimize your cough which will help you sleep better.  I have sent a prescription for this medication to your pharmacy because it cannot be purchased over-the-counter.   Common causes of urinary  tract infections include but are not limited to holding your urine longer than you should, squatting instead of sitting down when urinating, sitting around in wet clothing such as a wet swimsuit or gym clothes too long, not emptying your bladder after having sexual intercourse, wiping from back to front instead of front to back after having a bowel movement.     You were advised to begin antibiotics today because your urinalysis is abnormal and you are having active symptoms of an acute lower urinary tract infection also known as cystitis.     If you have not had complete resolution of your urinary tract infection symptoms after completing treatment as prescribed, please return to urgent care for repeat evaluation or follow-up with your primary care provider.  Repeat urinalysis and urine culture may be indicated for more directed therapy.   If your upper and lower respiratory tract infection symptoms have not meaningfully improved in the next 7 to 10 days, please return for repeat evaluation or follow-up with your regular provider.  If symptoms have worsened in the next 3 to 5 days, please return for repeat evaluation or follow-up with your regular provider.    Thank you for visiting urgent care today.  We appreciate the opportunity to participate in your care.

## 2023-05-06 ENCOUNTER — Encounter: Payer: Self-pay | Admitting: *Deleted

## 2023-05-06 ENCOUNTER — Inpatient Hospital Stay
Admission: EM | Admit: 2023-05-06 | Discharge: 2023-05-10 | DRG: 193 | Disposition: A | Discharge status: Home / Self Care | Payer: Medicaid Other | Attending: Internal Medicine | Admitting: Internal Medicine

## 2023-05-06 ENCOUNTER — Emergency Department: Payer: Medicaid Other

## 2023-05-06 ENCOUNTER — Other Ambulatory Visit: Payer: Self-pay

## 2023-05-06 DIAGNOSIS — J209 Acute bronchitis, unspecified: Principal | ICD-10-CM | POA: Diagnosis present

## 2023-05-06 DIAGNOSIS — Z1152 Encounter for screening for COVID-19: Secondary | ICD-10-CM

## 2023-05-06 DIAGNOSIS — J440 Chronic obstructive pulmonary disease with acute lower respiratory infection: Secondary | ICD-10-CM | POA: Diagnosis present

## 2023-05-06 DIAGNOSIS — F909 Attention-deficit hyperactivity disorder, unspecified type: Secondary | ICD-10-CM | POA: Diagnosis present

## 2023-05-06 DIAGNOSIS — J4521 Mild intermittent asthma with (acute) exacerbation: Secondary | ICD-10-CM | POA: Diagnosis present

## 2023-05-06 DIAGNOSIS — E785 Hyperlipidemia, unspecified: Secondary | ICD-10-CM | POA: Diagnosis present

## 2023-05-06 DIAGNOSIS — Z833 Family history of diabetes mellitus: Secondary | ICD-10-CM

## 2023-05-06 DIAGNOSIS — J45901 Unspecified asthma with (acute) exacerbation: Secondary | ICD-10-CM | POA: Diagnosis present

## 2023-05-06 DIAGNOSIS — F4310 Post-traumatic stress disorder, unspecified: Secondary | ICD-10-CM | POA: Diagnosis present

## 2023-05-06 DIAGNOSIS — Z818 Family history of other mental and behavioral disorders: Secondary | ICD-10-CM

## 2023-05-06 DIAGNOSIS — Z716 Tobacco abuse counseling: Secondary | ICD-10-CM

## 2023-05-06 DIAGNOSIS — Z809 Family history of malignant neoplasm, unspecified: Secondary | ICD-10-CM

## 2023-05-06 DIAGNOSIS — J9601 Acute respiratory failure with hypoxia: Secondary | ICD-10-CM | POA: Diagnosis present

## 2023-05-06 DIAGNOSIS — E669 Obesity, unspecified: Secondary | ICD-10-CM | POA: Diagnosis present

## 2023-05-06 DIAGNOSIS — Z79899 Other long term (current) drug therapy: Secondary | ICD-10-CM

## 2023-05-06 DIAGNOSIS — J441 Chronic obstructive pulmonary disease with (acute) exacerbation: Secondary | ICD-10-CM | POA: Diagnosis present

## 2023-05-06 DIAGNOSIS — J4541 Moderate persistent asthma with (acute) exacerbation: Secondary | ICD-10-CM | POA: Diagnosis present

## 2023-05-06 DIAGNOSIS — F17210 Nicotine dependence, cigarettes, uncomplicated: Secondary | ICD-10-CM | POA: Diagnosis present

## 2023-05-06 DIAGNOSIS — F32A Depression, unspecified: Secondary | ICD-10-CM | POA: Diagnosis present

## 2023-05-06 DIAGNOSIS — F411 Generalized anxiety disorder: Secondary | ICD-10-CM | POA: Diagnosis present

## 2023-05-06 DIAGNOSIS — J45909 Unspecified asthma, uncomplicated: Secondary | ICD-10-CM | POA: Diagnosis present

## 2023-05-06 DIAGNOSIS — I2510 Atherosclerotic heart disease of native coronary artery without angina pectoris: Secondary | ICD-10-CM | POA: Diagnosis present

## 2023-05-06 DIAGNOSIS — J157 Pneumonia due to Mycoplasma pneumoniae: Principal | ICD-10-CM | POA: Diagnosis present

## 2023-05-06 MED ORDER — MAGNESIUM SULFATE 2 GM/50ML IV SOLN
2.0000 g | Freq: Once | INTRAVENOUS | Status: AC
  Administered 2023-05-07: 2 g via INTRAVENOUS
  Filled 2023-05-06: qty 50

## 2023-05-06 MED ORDER — IPRATROPIUM-ALBUTEROL 0.5-2.5 (3) MG/3ML IN SOLN
3.0000 mL | Freq: Once | RESPIRATORY_TRACT | Status: AC
  Administered 2023-05-06: 3 mL via RESPIRATORY_TRACT
  Filled 2023-05-06: qty 3

## 2023-05-06 MED ORDER — DEXTROMETHORPHAN POLISTIREX ER 30 MG/5ML PO SUER
30.0000 mg | Freq: Once | ORAL | Status: AC
  Administered 2023-05-06: 30 mg via ORAL
  Filled 2023-05-06: qty 5

## 2023-05-06 MED ORDER — METHYLPREDNISOLONE SODIUM SUCC 125 MG IJ SOLR
125.0000 mg | Freq: Once | INTRAMUSCULAR | Status: AC
  Administered 2023-05-06: 125 mg via INTRAVENOUS
  Filled 2023-05-06: qty 2

## 2023-05-06 MED ORDER — BENZONATATE 100 MG PO CAPS: 200.0000 mg | ORAL_CAPSULE | Freq: Three times a day (TID) | ORAL | 0 refills | Status: DC

## 2023-05-06 MED ORDER — HYDROCOD POLI-CHLORPHE POLI ER 10-8 MG/5ML PO SUER
5.0000 mL | Freq: Once | ORAL | Status: AC
  Administered 2023-05-06: 5 mL via ORAL
  Filled 2023-05-06: qty 5

## 2023-05-06 MED ORDER — BENZONATATE 100 MG PO CAPS
200.0000 mg | ORAL_CAPSULE | Freq: Once | ORAL | Status: AC
  Administered 2023-05-06: 200 mg via ORAL
  Filled 2023-05-06: qty 2

## 2023-05-06 MED ORDER — PSEUDOEPH-BROMPHEN-DM 30-2-10 MG/5ML PO SYRP: 10.0000 mL | ORAL_SOLUTION | Freq: Four times a day (QID) | ORAL | 0 refills | Status: DC | PRN

## 2023-05-06 NOTE — ED Provider Notes (Signed)
11:38 PM  Assumed care at shift change.  Patient here with history of asthma with cough, wheezing.  Chest x-ray shows bronchitis.  No opacity or edema.  Labs, COVID and flu swab pending.  Patient has had 2 DuoNebs and sats are 88% on room air.  Doing well on nasal cannula.  Will give third DuoNeb, Solu-Medrol but anticipate potential admission for asthma exacerbation.  On my exam, patient is tachypneic, tachycardic.  She is speaking in truncated sentences.  She has diffuse inspiratory and expiratory wheezing.  She reports she is a smoker.  She does not wear oxygen at home.  She has never been admitted to the hospital before for her asthma.  12:10 AM  Called to patient's room by her significant other for increased shortness of breath.  Patient was hypoxic again in the upper 80s with increased work of breathing, tachypnea.  Increased her to 10 L nasal cannula with no significant improvement.  Placed on nonrebreather and respiratory therapy called to start BiPAP.  VBG ordered.  Will start Rocephin and azithromycin for bronchitis.  Will obtain blood cultures, lactic and procalcitonin.   1:54 AM  Consulted and discussed patient's case with hospitalist, Dr. Arville Care.  I have recommended admission and consulting physician agrees and will place admission orders.  Patient (and family if present) agree with this plan.   I reviewed all nursing notes, vitals, pertinent previous records.  All labs, EKGs, imaging ordered have been independently reviewed and interpreted by myself.  Nurse reports the patient is having a hard time keeping BiPAP on.  Given Ativan with some relief.  Labs show no leukocytosis.  Normal electrolytes.  Lactic normal.  Procalcitonin negative.  VBG reassuring.  CRITICAL CARE Performed by: Rochele Raring   Total critical care time: 45 minutes  Critical care time was exclusive of separately billable procedures and treating other patients.  Critical care was necessary to treat or  prevent imminent or life-threatening deterioration.  Critical care was time spent personally by me on the following activities: development of treatment plan with patient and/or surrogate as well as nursing, discussions with consultants, evaluation of patient's response to treatment, examination of patient, obtaining history from patient or surrogate, ordering and performing treatments and interventions, ordering and review of laboratory studies, ordering and review of radiographic studies, pulse oximetry and re-evaluation of patient's condition.    Nidia Grogan, Layla Maw, DO 05/07/23 [phone]

## 2023-05-06 NOTE — ED Triage Notes (Addendum)
Pt ambulatory to triage. Pt was dx with bronchitis on 1/16/ at urgent care.  Hx cig smoking .  Pt taking abx and cough meds.   Pt alert  speech clear.

## 2023-05-06 NOTE — ED Provider Notes (Signed)
[facility] Emergency Department Provider Note     Event Date/Time   First MD Initiated Contact with Patient 05/06/23 2142     (approximate)   History   Cough   HPI  Christina Warner is a 28 y.o. female with a history of ADHD, GAD, PTSD, and asthma presents to the ED for evaluation of continued cough. She was diagnosed with bronchitis at a local [facility] and started on Augmentin, Prednisone taper, Promethazine syrup and has been using her albuterol and nebulizer as previously prescribed. She presents to the ED for ongoing symptoms. No reports of FCS.   Physical Exam   Triage Vital Signs: ED Triage Vitals  Encounter Vitals Group     BP 05/06/23 2018 (!) 144/82     Systolic BP Percentile --      Diastolic BP Percentile --      Pulse Rate 05/06/23 2018 (!) 102     Resp 05/06/23 2018 (!) 24     Temp 05/06/23 2018 99.1 F (37.3 C)     Temp Source 05/06/23 2018 Oral     SpO2 05/06/23 2018 94 %     Weight 05/06/23 2015 200 lb (90.7 kg)     Height 05/06/23 2015 5\' 1"  (1.549 m)     Head Circumference --      Peak Flow --      Pain Score 05/06/23 2015 8     Pain Loc --      Pain Education --      Exclude from Growth Chart --     Most recent vital signs: Vitals:   05/06/23 2018  BP: (!) 144/82  Pulse: (!) 102  Resp: (!) 24  Temp: 99.1 F (37.3 C)  SpO2: 94%    General Awake, no distress. NAD HEENT NCAT. PERRL. EOMI. No rhinorrhea. Mucous membranes are moist.  CV:  Good peripheral perfusion. RRR RESP:  Normal effort.  Patient only able to speak in 2-3 word sentences.  Bilateral expiratory wheeze & rhonchi noted. Persistent cough noted.  ABD:  No distention.    ED Results / Procedures / Treatments   Labs (all labs ordered are listed, but only abnormal results are displayed) Labs Reviewed - No data to display    EKG   RADIOLOGY  I personally viewed and evaluated these images as part of my medical decision making, as well as reviewing the  written report by the radiologist.  ED Provider Interpretation: no focal consolidation  DG Chest 2 View Result Date: 05/06/2023 CLINICAL DATA:  Cough, diagnosed with bronchitis at urgent care January 16. Taking antibiotics. EXAM: CHEST - 2 VIEW COMPARISON:  PA Lat 10/16/2021 FINDINGS: The heart size and mediastinal contours are within normal limits. There is central bronchial thickening consistent with a prior diagnosis of bronchitis. There are progressive increased interstitial markings in the lower lung fields which could relate to smoking or could indicate a mild interstitial pneumonitis. No focal consolidation, effusion or pneumothorax is seen. The visualized skeletal structures are unremarkable. IMPRESSION: 1. Central bronchial thickening consistent with a prior diagnosis of bronchitis. 2. Progressive increased interstitial markings in the lower lung fields which could relate to smoking or could indicate a mild interstitial pneumonitis. 3. No focal or segmental consolidation. Electronically Signed   By: Almira Bar M.D.   On: 05/06/2023 20:38    PROCEDURES:  Critical Care performed: No  Procedures   MEDICATIONS ORDERED IN ED: Medications  ipratropium-albuterol (DUONEB) 0.5-2.5 (3) MG/3ML nebulizer solution  3 mL (has no administration in time range)  benzonatate (TESSALON) capsule 200 mg (has no administration in time range)  dextromethorphan (DELSYM) 30 MG/5ML liquid 30 mg (has no administration in time range)     IMPRESSION / MDM / ASSESSMENT AND PLAN / ED COURSE  I reviewed the triage vital signs and the nursing notes.                              Differential diagnosis includes, but is not limited to, CAP, bronchitis, Covid, Flu, RSV, Viral URI, asthma exacerbation  Patient's presentation is most consistent with acute complicated illness / injury requiring diagnostic workup.  Patient's diagnosis is consistent with acute bronchitis and asthma exacerbation.  Patient unable to  maintain her O2 sats on room air.  After 2 DuoNebs she is subjectively improved but still with intermittent cough/Bronchospasm.  Patient noted to have room air sats as low as 88%.  When placed on 2 to 3 L of O2 patient can maintain 92% O2 sats.  We discussed the possibility of admission based on the patient's hypoxia and asthma exacerbation.  She is agreeable to the plan.  Will attempt to max titrate bolus at this time and reevaluate.  ----------------------------------------- 11:42 PM on 05/06/2023 ----------------------------------------- Patient care is transferred to my attending K.  Ward, DO at this time.  She will reevaluate the patient intermittently, and determine indication for admission for hypoxia, acute bronchitis, and asthma exacerbation.    FINAL CLINICAL IMPRESSION(S) / ED DIAGNOSES   Final diagnoses:  Acute bronchitis, unspecified organism     Rx / DC Orders   ED Discharge Orders     None        Note:  This document was prepared using Dragon voice recognition software and may include unintentional dictation errors.    Lissa Hoard, PA-C 05/06/23 2343    Ward, Layla Maw, DO 05/07/23 [phone]

## 2023-05-07 ENCOUNTER — Encounter: Payer: Self-pay | Admitting: Family Medicine

## 2023-05-07 DIAGNOSIS — F4310 Post-traumatic stress disorder, unspecified: Secondary | ICD-10-CM | POA: Diagnosis present

## 2023-05-07 DIAGNOSIS — J45901 Unspecified asthma with (acute) exacerbation: Secondary | ICD-10-CM | POA: Diagnosis present

## 2023-05-07 DIAGNOSIS — E785 Hyperlipidemia, unspecified: Secondary | ICD-10-CM | POA: Diagnosis present

## 2023-05-07 DIAGNOSIS — Z818 Family history of other mental and behavioral disorders: Secondary | ICD-10-CM | POA: Diagnosis not present

## 2023-05-07 DIAGNOSIS — J4541 Moderate persistent asthma with (acute) exacerbation: Secondary | ICD-10-CM | POA: Diagnosis present

## 2023-05-07 DIAGNOSIS — Z809 Family history of malignant neoplasm, unspecified: Secondary | ICD-10-CM | POA: Diagnosis not present

## 2023-05-07 DIAGNOSIS — F411 Generalized anxiety disorder: Secondary | ICD-10-CM | POA: Diagnosis present

## 2023-05-07 DIAGNOSIS — J4521 Mild intermittent asthma with (acute) exacerbation: Secondary | ICD-10-CM

## 2023-05-07 DIAGNOSIS — J440 Chronic obstructive pulmonary disease with acute lower respiratory infection: Secondary | ICD-10-CM | POA: Diagnosis present

## 2023-05-07 DIAGNOSIS — J9601 Acute respiratory failure with hypoxia: Secondary | ICD-10-CM | POA: Diagnosis present

## 2023-05-07 DIAGNOSIS — E669 Obesity, unspecified: Secondary | ICD-10-CM | POA: Diagnosis present

## 2023-05-07 DIAGNOSIS — J157 Pneumonia due to Mycoplasma pneumoniae: Secondary | ICD-10-CM | POA: Diagnosis present

## 2023-05-07 DIAGNOSIS — Z716 Tobacco abuse counseling: Secondary | ICD-10-CM | POA: Diagnosis not present

## 2023-05-07 DIAGNOSIS — J45909 Unspecified asthma, uncomplicated: Secondary | ICD-10-CM | POA: Diagnosis present

## 2023-05-07 DIAGNOSIS — Z79899 Other long term (current) drug therapy: Secondary | ICD-10-CM | POA: Diagnosis not present

## 2023-05-07 DIAGNOSIS — J209 Acute bronchitis, unspecified: Secondary | ICD-10-CM | POA: Diagnosis present

## 2023-05-07 DIAGNOSIS — F909 Attention-deficit hyperactivity disorder, unspecified type: Secondary | ICD-10-CM | POA: Diagnosis present

## 2023-05-07 DIAGNOSIS — Z1152 Encounter for screening for COVID-19: Secondary | ICD-10-CM | POA: Diagnosis not present

## 2023-05-07 DIAGNOSIS — F17210 Nicotine dependence, cigarettes, uncomplicated: Secondary | ICD-10-CM | POA: Diagnosis present

## 2023-05-07 DIAGNOSIS — I2510 Atherosclerotic heart disease of native coronary artery without angina pectoris: Secondary | ICD-10-CM | POA: Diagnosis present

## 2023-05-07 DIAGNOSIS — J441 Chronic obstructive pulmonary disease with (acute) exacerbation: Secondary | ICD-10-CM | POA: Diagnosis present

## 2023-05-07 DIAGNOSIS — Z833 Family history of diabetes mellitus: Secondary | ICD-10-CM | POA: Diagnosis not present

## 2023-05-07 DIAGNOSIS — F32A Depression, unspecified: Secondary | ICD-10-CM | POA: Diagnosis present

## 2023-05-07 LAB — BLOOD CULTURE ID PANEL (REFLEXED) - BCID2

## 2023-05-07 LAB — BLOOD GAS, VENOUS
Acid-Base Excess: 6.3 mmol/L — ABNORMAL HIGH (ref 0.0–2.0)
Bicarbonate: 33.1 mmol/L — ABNORMAL HIGH (ref 20.0–28.0)
O2 Saturation: 52.8 %
Patient temperature: 37
pCO2, Ven: 56 mm[Hg] (ref 44–60)
pH, Ven: 7.38 (ref 7.25–7.43)
pO2, Ven: 33 mm[Hg] (ref 32–45)

## 2023-05-07 LAB — CBC WITH DIFFERENTIAL/PLATELET
Abs Immature Granulocytes: 0.04 10*3/uL (ref 0.00–0.07)
Basophils Absolute: 0 10*3/uL (ref 0.0–0.1)
Basophils Relative: 0 %
Eosinophils Absolute: 0.1 10*3/uL (ref 0.0–0.5)
Eosinophils Relative: 1 %
HCT: 37.8 % (ref 36.0–46.0)
Hemoglobin: 13 g/dL (ref 12.0–15.0)
Immature Granulocytes: 0 %
Lymphocytes Relative: 33 %
Lymphs Abs: 3.1 10*3/uL (ref 0.7–4.0)
MCH: 33.6 pg (ref 26.0–34.0)
MCHC: 34.4 g/dL (ref 30.0–36.0)
MCV: 97.7 fL (ref 80.0–100.0)
Monocytes Absolute: 0.7 10*3/uL (ref 0.1–1.0)
Monocytes Relative: 7 %
Neutro Abs: 5.5 10*3/uL (ref 1.7–7.7)
Neutrophils Relative %: 59 %
Platelets: 299 10*3/uL (ref 150–400)
RBC: 3.87 MIL/uL (ref 3.87–5.11)
RDW: 12.4 % (ref 11.5–15.5)
WBC: 9.5 10*3/uL (ref 4.0–10.5)
nRBC: 0 % (ref 0.0–0.2)

## 2023-05-07 LAB — BASIC METABOLIC PANEL
Anion gap: 11 (ref 5–15)
BUN: 6 mg/dL (ref 6–20)
CO2: 26 mmol/L (ref 22–32)
Calcium: 8.8 mg/dL — ABNORMAL LOW (ref 8.9–10.3)
Chloride: 102 mmol/L (ref 98–111)
Creatinine, Ser: 0.9 mg/dL (ref 0.44–1.00)
GFR, Estimated: >60 mL/min (ref >60)
Glucose, Bld: 121 mg/dL — ABNORMAL HIGH (ref 70–99)
Potassium: 3.5 mmol/L (ref 3.5–5.1)
Sodium: 139 mmol/L (ref 135–145)

## 2023-05-07 LAB — HCG, QUANTITATIVE, PREGNANCY: hCG, Beta Chain, Quant, S: <1 m[IU]/mL (ref <5)

## 2023-05-07 LAB — RESP PANEL BY RT-PCR (RSV, FLU A&B, COVID)  RVPGX2
Influenza A by PCR: NEGATIVE
Influenza B by PCR: NEGATIVE
Resp Syncytial Virus by PCR: NEGATIVE
SARS Coronavirus 2 by RT PCR: NEGATIVE

## 2023-05-07 LAB — LACTIC ACID, PLASMA: Lactic Acid, Venous: 1.9 mmol/L (ref 0.5–1.9)

## 2023-05-07 LAB — PROCALCITONIN: Procalcitonin: <0.1 ng/mL

## 2023-05-07 LAB — HIV ANTIBODY (ROUTINE TESTING W REFLEX): HIV Screen 4th Generation wRfx: NONREACTIVE

## 2023-05-07 LAB — MRSA NEXT GEN BY PCR, NASAL: MRSA by PCR Next Gen: NOT DETECTED

## 2023-05-07 MED ORDER — HYDROMORPHONE HCL 1 MG/ML IJ SOLN
0.5000 mg | INTRAMUSCULAR | Status: DC | PRN
  Administered 2023-05-07 – 2023-05-08 (×7): 0.5 mg via INTRAVENOUS
  Filled 2023-05-07 (×7): qty 0.5

## 2023-05-07 MED ORDER — KETOROLAC TROMETHAMINE 30 MG/ML IJ SOLN
30.0000 mg | Freq: Four times a day (QID) | INTRAMUSCULAR | Status: DC | PRN
  Administered 2023-05-07 (×2): 30 mg via INTRAVENOUS
  Filled 2023-05-07 (×2): qty 1

## 2023-05-07 MED ORDER — METHYLPREDNISOLONE SODIUM SUCC 40 MG IJ SOLR: 40.0000 mg | Freq: Four times a day (QID) | INTRAMUSCULAR | Status: DC

## 2023-05-07 MED ORDER — BUDESONIDE 0.25 MG/2ML IN SUSP
0.2500 mg | Freq: Two times a day (BID) | RESPIRATORY_TRACT | Status: DC
  Administered 2023-05-07 – 2023-05-10 (×7): 0.25 mg via RESPIRATORY_TRACT
  Filled 2023-05-07 (×8): qty 2

## 2023-05-07 MED ORDER — LIDOCAINE 5 % EX PTCH
1.0000 | MEDICATED_PATCH | CUTANEOUS | Status: DC
  Administered 2023-05-07 – 2023-05-10 (×4): 1 via TRANSDERMAL
  Filled 2023-05-07 (×4): qty 1

## 2023-05-07 MED ORDER — ONDANSETRON HCL 4 MG PO TABS: 4.0000 mg | ORAL_TABLET | Freq: Four times a day (QID) | ORAL | Status: DC | PRN

## 2023-05-07 MED ORDER — FLUOXETINE HCL 20 MG PO CAPS
20.0000 mg | ORAL_CAPSULE | Freq: Every day | ORAL | Status: DC
Start: 2023-05-07 — End: 2023-05-10
  Administered 2023-05-07 – 2023-05-10 (×4): 20 mg via ORAL
  Filled 2023-05-07 (×4): qty 1

## 2023-05-07 MED ORDER — SODIUM CHLORIDE 0.9 % IV SOLN
500.0000 mg | Freq: Once | INTRAVENOUS | Status: AC
  Administered 2023-05-07: 500 mg via INTRAVENOUS
  Filled 2023-05-07: qty 5

## 2023-05-07 MED ORDER — ACETAMINOPHEN 650 MG RE SUPP: 650.0000 mg | Freq: Four times a day (QID) | RECTAL | Status: DC | PRN

## 2023-05-07 MED ORDER — DOXYCYCLINE HYCLATE 100 MG PO TABS
100.0000 mg | ORAL_TABLET | Freq: Two times a day (BID) | ORAL | Status: DC
  Administered 2023-05-07 – 2023-05-08 (×4): 100 mg via ORAL
  Filled 2023-05-07 (×4): qty 1

## 2023-05-07 MED ORDER — SODIUM CHLORIDE 0.9 % IV SOLN
2.0000 g | Freq: Once | INTRAVENOUS | Status: AC
  Administered 2023-05-07: 2 g via INTRAVENOUS
  Filled 2023-05-07: qty 20

## 2023-05-07 MED ORDER — ARIPIPRAZOLE 2 MG PO TABS
5.0000 mg | ORAL_TABLET | Freq: Every day | ORAL | Status: DC
  Administered 2023-05-07 – 2023-05-10 (×4): 5 mg via ORAL
  Filled 2023-05-07 (×2): qty 3
  Filled 2023-05-07: qty 1
  Filled 2023-05-07: qty 3

## 2023-05-07 MED ORDER — TRAZODONE HCL 50 MG PO TABS
50.0000 mg | ORAL_TABLET | Freq: Every day | ORAL | Status: DC
Start: 2023-05-07 — End: 2023-05-10
  Administered 2023-05-07 – 2023-05-09 (×3): 50 mg via ORAL
  Filled 2023-05-07 (×3): qty 1

## 2023-05-07 MED ORDER — ALBUTEROL SULFATE (2.5 MG/3ML) 0.083% IN NEBU
5.0000 mg | INHALATION_SOLUTION | Freq: Once | RESPIRATORY_TRACT | Status: AC
  Administered 2023-05-07: 5 mg via RESPIRATORY_TRACT
  Filled 2023-05-07: qty 6

## 2023-05-07 MED ORDER — METHYLPREDNISOLONE SODIUM SUCC 125 MG IJ SOLR
80.0000 mg | Freq: Two times a day (BID) | INTRAMUSCULAR | Status: DC
  Administered 2023-05-07 – 2023-05-09 (×5): 80 mg via INTRAVENOUS
  Filled 2023-05-07 (×5): qty 2

## 2023-05-07 MED ORDER — NICOTINE 21 MG/24HR TD PT24
21.0000 mg | MEDICATED_PATCH | Freq: Every day | TRANSDERMAL | Status: DC
  Administered 2023-05-07 – 2023-05-10 (×4): 21 mg via TRANSDERMAL
  Filled 2023-05-07 (×4): qty 1

## 2023-05-07 MED ORDER — BENZONATATE 100 MG PO CAPS
200.0000 mg | ORAL_CAPSULE | Freq: Three times a day (TID) | ORAL | Status: DC | PRN
  Administered 2023-05-07 – 2023-05-08 (×3): 200 mg via ORAL
  Filled 2023-05-07 (×3): qty 2

## 2023-05-07 MED ORDER — ACETAMINOPHEN 325 MG PO TABS
650.0000 mg | ORAL_TABLET | Freq: Four times a day (QID) | ORAL | Status: DC | PRN
  Administered 2023-05-08 – 2023-05-09 (×2): 650 mg via ORAL
  Filled 2023-05-07 (×2): qty 2

## 2023-05-07 MED ORDER — ONDANSETRON HCL 4 MG/2ML IJ SOLN: 4.0000 mg | Freq: Four times a day (QID) | INTRAMUSCULAR | Status: DC | PRN

## 2023-05-07 MED ORDER — LACTATED RINGERS IV BOLUS
1000.0000 mL | Freq: Once | INTRAVENOUS | Status: AC
  Administered 2023-05-07: 1000 mL via INTRAVENOUS

## 2023-05-07 MED ORDER — ALBUTEROL SULFATE (2.5 MG/3ML) 0.083% IN NEBU
3.0000 mL | INHALATION_SOLUTION | RESPIRATORY_TRACT | Status: DC | PRN
  Administered 2023-05-08: 3 mL via RESPIRATORY_TRACT
  Filled 2023-05-07 (×2): qty 3

## 2023-05-07 MED ORDER — LORAZEPAM 2 MG/ML IJ SOLN
1.0000 mg | Freq: Once | INTRAMUSCULAR | Status: AC
  Administered 2023-05-07: 1 mg via INTRAVENOUS
  Filled 2023-05-07: qty 1

## 2023-05-07 MED ORDER — ACETAMINOPHEN 500 MG PO TABS
1000.0000 mg | ORAL_TABLET | Freq: Once | ORAL | Status: AC
  Administered 2023-05-07: 1000 mg via ORAL
  Filled 2023-05-07: qty 2

## 2023-05-07 MED ORDER — IPRATROPIUM-ALBUTEROL 0.5-2.5 (3) MG/3ML IN SOLN
3.0000 mL | Freq: Four times a day (QID) | RESPIRATORY_TRACT | Status: DC
Start: 2023-05-07 — End: 2023-05-08
  Administered 2023-05-07 – 2023-05-08 (×5): 3 mL via RESPIRATORY_TRACT
  Filled 2023-05-07 (×4): qty 3

## 2023-05-07 NOTE — ED Notes (Signed)
Patient refusing bipap at this time. Provider notified.

## 2023-05-07 NOTE — Progress Notes (Signed)
Patient arrived to floor on 3L nasal cannula. O2 sat 91%. Per Dr. Chipper Herb, saturation goals 91-93%. Patient alert and oriented. Pain 7/10 to chest related to coughing, PRN Tessalon and Toradol given (see MAR). Incentive spirometer given and patient was instructed on use, able to demonstrate understanding. Significant other at bedside, call bell and personal belongings in reach.

## 2023-05-07 NOTE — Progress Notes (Signed)
PHARMACIST - PHYSICIAN COMMUNICATION   CONCERNING: Methylprednisolone IV    Current order: Methylprednisolone IV 40mg  every 6 hours     DESCRIPTION: Per [place] Protocol:   IV methylprednisolone will be converted to either a q12h or q24h frequency with the same total daily dose (TDD).  Ordered Dose: 1 to 125 mg TDD; convert to: TDD q24h.  Ordered Dose: 126 to 250 mg TDD; convert to: TDD div q12h.  Ordered Dose: >250 mg TDD; DAW.  Order has been adjusted to: Methylprednisolone IV 80 mg every 12 hours    Gardner Candle , PharmD, BCPS Clinical Pharmacist  05/07/2023 7:28 AM

## 2023-05-07 NOTE — ED Notes (Signed)
Called CCMD to verify monitoring

## 2023-05-07 NOTE — Progress Notes (Signed)
PHARMACY - PHYSICIAN COMMUNICATION CRITICAL VALUE ALERT - BLOOD CULTURE IDENTIFICATION (BCID)  Christina Warner is an 28 y.o. female who presented to [facility] on 05/06/2023 with a chief complaint of worsening cough, wheezing, shortness of breath.   Assessment:   1/23 BCx 2/4 GPC, Staph species, no resistance  Name of physician (or Provider) Contacted: Dr. Mikey College   Current antibiotics:  Doxycycline 100 mg BID  Changes to prescribed antibiotics recommended:  Believed to be a possible contaminant. Will continue to monitor cultures and vitals and escalate therapy as indicated.    Results for orders placed or performed during the hospital encounter of 05/06/23  Blood Culture ID Panel (Reflexed) (Collected: 05/07/2023  1:11 AM)  Result Value Ref Range   Enterococcus faecalis NOT DETECTED NOT DETECTED   Enterococcus Faecium NOT DETECTED NOT DETECTED   Listeria monocytogenes NOT DETECTED NOT DETECTED   Staphylococcus species DETECTED (A) NOT DETECTED   Staphylococcus aureus (BCID) NOT DETECTED NOT DETECTED   Staphylococcus epidermidis NOT DETECTED NOT DETECTED   Staphylococcus lugdunensis NOT DETECTED NOT DETECTED   Streptococcus species NOT DETECTED NOT DETECTED   Streptococcus agalactiae NOT DETECTED NOT DETECTED   Streptococcus pneumoniae NOT DETECTED NOT DETECTED   Streptococcus pyogenes NOT DETECTED NOT DETECTED   A.calcoaceticus-baumannii NOT DETECTED NOT DETECTED   Bacteroides fragilis NOT DETECTED NOT DETECTED   Enterobacterales NOT DETECTED NOT DETECTED   Enterobacter cloacae complex NOT DETECTED NOT DETECTED   Escherichia coli NOT DETECTED NOT DETECTED   Klebsiella aerogenes NOT DETECTED NOT DETECTED   Klebsiella oxytoca NOT DETECTED NOT DETECTED   Klebsiella pneumoniae NOT DETECTED NOT DETECTED   Proteus species NOT DETECTED NOT DETECTED   Salmonella species NOT DETECTED NOT DETECTED   Serratia marcescens NOT DETECTED NOT DETECTED   Haemophilus influenzae NOT DETECTED NOT  DETECTED   Neisseria meningitidis NOT DETECTED NOT DETECTED   Pseudomonas aeruginosa NOT DETECTED NOT DETECTED   Stenotrophomonas maltophilia NOT DETECTED NOT DETECTED   Candida albicans NOT DETECTED NOT DETECTED   Candida auris NOT DETECTED NOT DETECTED   Candida glabrata NOT DETECTED NOT DETECTED   Candida krusei NOT DETECTED NOT DETECTED   Candida parapsilosis NOT DETECTED NOT DETECTED   Candida tropicalis NOT DETECTED NOT DETECTED   Cryptococcus neoformans/gattii NOT DETECTED NOT DETECTED    Effie Shy, PharmD Pharmacy Resident  05/07/2023 4:06 PM

## 2023-05-07 NOTE — Plan of Care (Signed)
  Problem: Education: Goal: Knowledge of General Education information will improve Description: Including pain rating scale, medication(s)/side effects and non-pharmacologic comfort measures Outcome: Progressing   Problem: Pain Managment: Goal: General experience of comfort will improve and/or be controlled Outcome: Progressing
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
## 2023-05-07 NOTE — Progress Notes (Signed)
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  Microbiology reported 2/4 bottles grow gram-positive cocci, likely staph species with no resistant.  Review of patient's chart, patient has no fever no leukocytosis and all of her symptoms can be explained by asthma exacerbation with probably atypical lung infection. Staph pneumonia of low suspicion.  Plan to continue current doxycycline coverage and waiting for the remaining culture to resolve.  Discussed with pharmacy and ID pharmacist.

## 2023-05-07 NOTE — Progress Notes (Signed)
Pt transferred to ED 3 on the BIPAP without incident. Pt remains on BIPAP and is tol well at this time.

## 2023-05-07 NOTE — ED Notes (Signed)
Pt ambulatory to toilet in room at this time with little assistance from EDT. Pt assisted back into bed and hooked back up to monitor. Call light within reach. No further needs at this time.

## 2023-05-08 DIAGNOSIS — J209 Acute bronchitis, unspecified: Secondary | ICD-10-CM | POA: Diagnosis not present

## 2023-05-08 DIAGNOSIS — J9601 Acute respiratory failure with hypoxia: Secondary | ICD-10-CM

## 2023-05-08 DIAGNOSIS — J4541 Moderate persistent asthma with (acute) exacerbation: Secondary | ICD-10-CM

## 2023-05-08 LAB — CBC
HCT: 33.6 % — ABNORMAL LOW (ref 36.0–46.0)
Hemoglobin: 12.2 g/dL (ref 12.0–15.0)
MCH: 35.4 pg — ABNORMAL HIGH (ref 26.0–34.0)
MCHC: 36.3 g/dL — ABNORMAL HIGH (ref 30.0–36.0)
MCV: 97.4 fL (ref 80.0–100.0)
Platelets: 339 10*3/uL (ref 150–400)
RBC: 3.45 MIL/uL — ABNORMAL LOW (ref 3.87–5.11)
RDW: 12.9 % (ref 11.5–15.5)
WBC: 11.5 10*3/uL — ABNORMAL HIGH (ref 4.0–10.5)
nRBC: 0 % (ref 0.0–0.2)

## 2023-05-08 LAB — EXPECTORATED SPUTUM ASSESSMENT W GRAM STAIN, RFLX TO RESP C

## 2023-05-08 MED ORDER — KETOROLAC TROMETHAMINE 30 MG/ML IJ SOLN
30.0000 mg | Freq: Once | INTRAMUSCULAR | Status: AC
  Administered 2023-05-08: 30 mg via INTRAVENOUS
  Filled 2023-05-08: qty 1

## 2023-05-08 MED ORDER — GUAIFENESIN ER 600 MG PO TB12
600.0000 mg | ORAL_TABLET | Freq: Two times a day (BID) | ORAL | Status: DC
  Administered 2023-05-08 – 2023-05-10 (×5): 600 mg via ORAL
  Filled 2023-05-08 (×5): qty 1

## 2023-05-08 MED ORDER — BENZONATATE 100 MG PO CAPS
200.0000 mg | ORAL_CAPSULE | Freq: Three times a day (TID) | ORAL | Status: DC
  Administered 2023-05-08 – 2023-05-10 (×6): 200 mg via ORAL
  Filled 2023-05-08 (×6): qty 2

## 2023-05-08 MED ORDER — FLUCONAZOLE 100 MG PO TABS
200.0000 mg | ORAL_TABLET | Freq: Once | ORAL | Status: AC
  Administered 2023-05-08: 200 mg via ORAL
  Filled 2023-05-08: qty 2

## 2023-05-08 MED ORDER — IPRATROPIUM-ALBUTEROL 0.5-2.5 (3) MG/3ML IN SOLN
3.0000 mL | Freq: Three times a day (TID) | RESPIRATORY_TRACT | Status: DC
  Administered 2023-05-08 – 2023-05-10 (×6): 3 mL via RESPIRATORY_TRACT
  Filled 2023-05-08 (×7): qty 3

## 2023-05-08 MED ORDER — ALBUTEROL SULFATE HFA 108 (90 BASE) MCG/ACT IN AERS
2.0000 | INHALATION_SPRAY | RESPIRATORY_TRACT | Status: DC | PRN
  Administered 2023-05-09: 2 via RESPIRATORY_TRACT
  Filled 2023-05-08: qty 6.7

## 2023-05-08 MED ORDER — IBUPROFEN 400 MG PO TABS
400.0000 mg | ORAL_TABLET | ORAL | Status: DC | PRN
  Administered 2023-05-08: 400 mg via ORAL
  Filled 2023-05-08: qty 1

## 2023-05-08 MED ORDER — IBUPROFEN 400 MG PO TABS
400.0000 mg | ORAL_TABLET | ORAL | Status: DC | PRN
  Administered 2023-05-08 – 2023-05-09 (×2): 400 mg via ORAL
  Filled 2023-05-08 (×2): qty 1

## 2023-05-08 NOTE — Progress Notes (Signed)
[facility]  - [place] at [facility]   PATIENT NAME: Christina Warner    MR#:  [id number]  DATE OF BIRTH:  May 08, 1995  SUBJECTIVE:  no family at bedside. Patient came in with increasing shortness of breath and dry cough. She was seen in the emergency room for the similar symptoms on 16 January. Patient tells me she finished her amoxicillin but did not complete her steroid taper. Continues to smoke  one pack a day. Denies any recent travel, any sick contact or skin rashes.   VITALS:  Blood pressure 107/76, pulse 72, temperature 98.4 F (36.9 C), resp. rate 18, height 5\' 1"  (1.549 m), weight 90.7 kg, last menstrual period 03/29/2023, SpO2 98%.  PHYSICAL EXAMINATION:   GENERAL:  28 y.o.-year-old patient with no acute distress. Obese, poor dentition LUNGS: coarse breath sounds bilaterally, no wheezing CARDIOVASCULAR: S1, S2 normal. No murmur   ABDOMEN: Soft, nontender, nondistended. Bowel sounds present.  EXTREMITIES: No  edema b/l.    NEUROLOGIC: nonfocal  patient is alert and awake   LABORATORY PANEL:  CBC Recent Labs  Lab 05/08/23 0415  WBC 11.5*  HGB 12.2  HCT 33.6*  PLT 339    Chemistries  Recent Labs  Lab 05/06/23 2336  NA 139  K 3.5  CL 102  CO2 26  GLUCOSE 121*  BUN 6  CREATININE 0.90  CALCIUM 8.8*   Cardiac Enzymes No results for input(s): "TROPONINI" in the last 168 hours. RADIOLOGY:  DG Chest 2 View Result Date: 05/06/2023 CLINICAL DATA:  Cough, diagnosed with bronchitis at urgent care January 16. Taking antibiotics. EXAM: CHEST - 2 VIEW COMPARISON:  PA Lat 10/16/2021 FINDINGS: The heart size and mediastinal contours are within normal limits. There is central bronchial thickening consistent with a prior diagnosis of bronchitis. There are progressive increased interstitial markings in the lower lung fields which could relate to smoking or could indicate a mild interstitial pneumonitis. No focal consolidation, effusion or pneumothorax is seen.  The visualized skeletal structures are unremarkable. IMPRESSION: 1. Central bronchial thickening consistent with a prior diagnosis of bronchitis. 2. Progressive increased interstitial markings in the lower lung fields which could relate to smoking or could indicate a mild interstitial pneumonitis. 3. No focal or segmental consolidation. Electronically Signed   By: Almira Bar M.D.   On: 05/06/2023 20:38    Assessment and Plan   Christina Warner is a 28 y.o. female with medical history significant of mild intermittent asthma, ADHD, anxiety/depression, presented with worsening of cough wheezing shortness of breath.   Patient started to have productive cough with clear phlegm wheezing and shortness of breath about 1 week ago and went to urgent care, when she was diagnosed with acute bronchitis and started on Augmentin and as needed cough medications.  Initially symptoms improved however last few days cough and wheezing and shortness of breath has become worse.    Afebrile, tachycardia and tachypneic at saturation 88% on room air and patient was placed on BiPAP overnight however did not tolerate well overnight and had to come out of BiPAP this morning. O2 saturation maintained at 95% on 3 L.   Chest x-ray showed bronchitis-like changes.   Acute asthma exacerbation acute on chronic bronchitis acute hypoxic respiratory failure-- sats on admission were 88% on room air -- patient recently completed course of Augmentin -- IV Solu-Medrol, nebulizer, empiric doxycycline -- cough meds PRN  Heavy smoker -- advised smoking cessation -- nicotine patch  Positive blood culture with staph species -- patient  afebrile white count stable -- no other source of infection identified. Will repeat blood cultures and continue to monitor  ADHD anxiety/depression -- continue SSRI   Procedures: Family communication : none Consults : none CODE STATUS: full DVT Prophylaxis : Lovenox Level of care: Telemetry  Medical Status is: Inpatient Remains inpatient appropriate because: asthma flare    TOTAL TIME TAKING CARE OF THIS PATIENT: 35 minutes.  >50% time spent on counselling and coordination of care  Note: This dictation was prepared with Dragon dictation along with smaller phrase technology. Any transcriptional errors that result from this process are unintentional.  Enedina Finner M.D    [facility]   CC: Primary care physician; Patient, No Pcp Per

## 2023-05-09 DIAGNOSIS — J209 Acute bronchitis, unspecified: Secondary | ICD-10-CM | POA: Diagnosis not present

## 2023-05-09 DIAGNOSIS — J9601 Acute respiratory failure with hypoxia: Secondary | ICD-10-CM | POA: Diagnosis not present

## 2023-05-09 DIAGNOSIS — J157 Pneumonia due to Mycoplasma pneumoniae: Secondary | ICD-10-CM | POA: Diagnosis not present

## 2023-05-09 DIAGNOSIS — J4541 Moderate persistent asthma with (acute) exacerbation: Secondary | ICD-10-CM | POA: Diagnosis not present

## 2023-05-09 LAB — CULTURE, BLOOD (ROUTINE X 2)

## 2023-05-09 LAB — MYCOPLASMA PNEUMONIAE ANTIBODY, IGM: Mycoplasma pneumo IgM: 4241 U/mL — ABNORMAL HIGH (ref 0–769)

## 2023-05-09 MED ORDER — HYDROCODONE-ACETAMINOPHEN 5-325 MG PO TABS
1.0000 | ORAL_TABLET | Freq: Four times a day (QID) | ORAL | Status: DC | PRN
  Administered 2023-05-09 – 2023-05-10 (×3): 1 via ORAL
  Filled 2023-05-09 (×3): qty 1

## 2023-05-09 MED ORDER — PREDNISONE 20 MG PO TABS
20.0000 mg | ORAL_TABLET | Freq: Every day | ORAL | Status: DC
  Administered 2023-05-10: 20 mg via ORAL
  Filled 2023-05-09: qty 1

## 2023-05-09 MED ORDER — IBUPROFEN 400 MG PO TABS
400.0000 mg | ORAL_TABLET | Freq: Four times a day (QID) | ORAL | Status: DC
  Administered 2023-05-09 – 2023-05-10 (×3): 400 mg via ORAL
  Filled 2023-05-09 (×4): qty 1

## 2023-05-09 MED ORDER — HYDROCOD POLI-CHLORPHE POLI ER 10-8 MG/5ML PO SUER
5.0000 mL | Freq: Two times a day (BID) | ORAL | Status: DC
  Administered 2023-05-09 – 2023-05-10 (×2): 5 mL via ORAL
  Filled 2023-05-09 (×2): qty 5

## 2023-05-09 MED ORDER — AZITHROMYCIN 500 MG PO TABS
500.0000 mg | ORAL_TABLET | Freq: Every day | ORAL | Status: AC
  Administered 2023-05-09: 500 mg via ORAL
  Filled 2023-05-09: qty 1

## 2023-05-09 MED ORDER — AZITHROMYCIN 250 MG PO TABS
250.0000 mg | ORAL_TABLET | Freq: Every day | ORAL | Status: DC
  Administered 2023-05-10: 250 mg via ORAL
  Filled 2023-05-09: qty 1

## 2023-05-09 NOTE — Progress Notes (Signed)
[facility]  - [place] at [facility]   PATIENT NAME: Christina Warner    MR#:  [id number]  DATE OF BIRTH:  05/05/1995  SUBJECTIVE:   family at bedside. Patient came in with increasing shortness of breath and dry cough.  Complains of bilateral rib pain with coughing spell. Wearing oxygen for comfort. Sats are otherwise 96 on room air. No fever.  VITALS:  Blood pressure (!) 141/77, pulse 75, temperature (!) 97.4 F (36.3 C), temperature source Oral, resp. rate 16, height 5\' 1"  (1.549 m), weight 90.7 kg, last menstrual period 03/29/2023, SpO2 96%.  PHYSICAL EXAMINATION:   GENERAL:  28 y.o.-year-old patient with no acute distress. Obese, poor dentition LUNGS: coarse breath sounds bilaterally, no wheezing CARDIOVASCULAR: S1, S2 normal. No murmur   ABDOMEN: Soft, nontender, nondistended. Bowel sounds present.  EXTREMITIES: No  edema b/l.    NEUROLOGIC: nonfocal  patient is alert and awake   LABORATORY PANEL:  CBC Recent Labs  Lab 05/08/23 0415  WBC 11.5*  HGB 12.2  HCT 33.6*  PLT 339    Chemistries  Recent Labs  Lab 05/06/23 2336  NA 139  K 3.5  CL 102  CO2 26  GLUCOSE 121*  BUN 6  CREATININE 0.90  CALCIUM 8.8*   Cardiac Enzymes No results for input(s): "TROPONINI" in the last 168 hours. RADIOLOGY:  No results found.   Assessment and Plan   Christina Warner is a 28 y.o. female with medical history significant of mild intermittent asthma, ADHD, anxiety/depression, presented with worsening of cough wheezing shortness of breath.   Patient started to have productive cough with clear phlegm wheezing and shortness of breath about 1 week ago and went to urgent care, when she was diagnosed with acute bronchitis and started on Augmentin and as needed cough medications.  Initially symptoms improved however last few days cough and wheezing and shortness of breath has become worse.    Afebrile, tachycardia and tachypneic at saturation 88% on room air and patient  was placed on BiPAP overnight however did not tolerate well overnight and had to come out of BiPAP this morning. O2 saturation maintained at 95% on 3 L.   Chest x-ray showed bronchitis-like changes.   Acute asthma exacerbation Mycoplasma Pneumonia acute on chronic bronchitis acute hypoxic respiratory failure-- sats on admission were 88% on room air -- patient recently completed course of Augmentin -- IV Solu-Medrol, nebulizer, empiric doxycycline--switich to z-pak -- cough meds PRN -M Pneumonia IgM >4000 --wean oxygen to RA  Heavy smoker -- advised smoking cessation -- nicotine patch  Positive blood culture with staph species--contamination given diff species -- patient afebrile white count stable -- no other source of infection identified. Will repeat blood cultures and continue to monitor --Repeat BC neg in 24 hours  ADHD anxiety/depression -- continue SSRI    Family communication : family at bedside Consults : none CODE STATUS: full DVT Prophylaxis : Lovenox Level of care: Telemetry Medical Status is: Inpatient Remains inpatient appropriate because: asthma flare    TOTAL TIME TAKING CARE OF THIS PATIENT: 35 minutes.  >50% time spent on counselling and coordination of care  Note: This dictation was prepared with Dragon dictation along with smaller phrase technology. Any transcriptional errors that result from this process are unintentional.  Enedina Finner M.D    [facility]   CC: Primary care physician; Patient, No Pcp Per

## 2023-05-09 NOTE — Plan of Care (Signed)
  Problem: Nutrition: Goal: Adequate nutrition will be maintained Outcome: Progressing   Problem: Coping: Goal: Level of anxiety will decrease Outcome: Progressing   Problem: Elimination: Goal: Will not experience complications related to bowel motility Outcome: Progressing   Problem: Education: Goal: Knowledge of General Education information will improve Description: Including pain rating scale, medication(s)/side effects and non-pharmacologic comfort measures Outcome: Not Progressing   Problem: Health Behavior/Discharge Planning: Goal: Ability to manage health-related needs will improve Outcome: Not Progressing   Problem: Clinical Measurements: Goal: Ability to maintain clinical measurements within normal limits will improve Outcome: Not Progressing   Problem: Activity: Goal: Risk for activity intolerance will decrease Outcome: Not Progressing

## 2023-05-10 DIAGNOSIS — J45901 Unspecified asthma with (acute) exacerbation: Secondary | ICD-10-CM | POA: Diagnosis not present

## 2023-05-10 DIAGNOSIS — J157 Pneumonia due to Mycoplasma pneumoniae: Secondary | ICD-10-CM | POA: Diagnosis not present

## 2023-05-10 DIAGNOSIS — J9601 Acute respiratory failure with hypoxia: Secondary | ICD-10-CM | POA: Diagnosis not present

## 2023-05-10 DIAGNOSIS — J209 Acute bronchitis, unspecified: Secondary | ICD-10-CM | POA: Diagnosis not present

## 2023-05-10 MED ORDER — LIDOCAINE 5 % EX PTCH: 1.0000 | MEDICATED_PATCH | CUTANEOUS | 0 refills | Status: DC

## 2023-05-10 MED ORDER — ALBUTEROL SULFATE HFA 108 (90 BASE) MCG/ACT IN AERS: 1.0000 | INHALATION_SPRAY | RESPIRATORY_TRACT | 2 refills | Status: AC | PRN

## 2023-05-10 MED ORDER — NICOTINE 21 MG/24HR TD PT24: 21.0000 mg | MEDICATED_PATCH | Freq: Every day | TRANSDERMAL | 0 refills | Status: AC

## 2023-05-10 MED ORDER — PREDNISONE 20 MG PO TABS: 20.0000 mg | ORAL_TABLET | Freq: Every day | ORAL | 0 refills | Status: AC

## 2023-05-10 MED ORDER — HYDROCODONE-ACETAMINOPHEN 5-325 MG PO TABS: 1.0000 | ORAL_TABLET | Freq: Four times a day (QID) | ORAL | 0 refills | Status: DC | PRN

## 2023-05-10 MED ORDER — AZITHROMYCIN 250 MG PO TABS: ORAL_TABLET | ORAL | 0 refills | Status: DC

## 2023-05-10 NOTE — Progress Notes (Addendum)
1018 Pt weaned off of O2 on room air sats 98%  1114 D/C AVS completed and reviewed with pt. All opportunities for questions answered and clarified. IV removed. Pt will be wheeled down to car at medical mall entrance via wheelchair.

## 2023-05-10 NOTE — Plan of Care (Signed)

## 2023-05-10 NOTE — Progress Notes (Signed)
Pt stated that she has a severe pain and was very upset that her dilaudid got discontinued. On call provider was notified. One time Toradol was given.  Patient responded well to the medication.

## 2023-05-10 NOTE — Discharge Summary (Signed)
Physician Discharge Summary   Patient: Christina Warner MRN: [id number] DOB: [birth year]  Admit date:     05/06/2023  Discharge date: 05/10/23  Discharge Physician: Enedina Finner   PCP: Patient, No Pcp Per   Recommendations at discharge:   establish PCP and follow-up  Discharge Diagnoses: Mycoplasma pneumonia asthma exacerbation chronic tobacco abuse  Hospital Course: Christina Warner is a 28 y.o. female with medical history significant of mild intermittent asthma, ADHD, anxiety/depression, presented with worsening of cough wheezing shortness of breath.   Patient started to have productive cough with clear phlegm wheezing and shortness of breath about 1 week ago and went to urgent care, when she was diagnosed with acute bronchitis and started on Augmentin and as needed cough medications.  Initially symptoms improved however last few days cough and wheezing and shortness of breath has become worse.     Afebrile, tachycardia and tachypneic at saturation 88% on room air and patient was placed on BiPAP overnight however did not tolerate well overnight and had to come out of BiPAP this morning. O2 saturation maintained at 95% on 3 L.    Chest x-ray showed bronchitis-like changes.    Acute asthma exacerbation Mycoplasma Pneumonia acute on chronic bronchitis acute hypoxic respiratory failure-- sats on admission were 88% on room air -- patient recently completed course of Augmentin -- IV Solu-Medrol, nebulizer, empiric doxycycline--switich to z-pak -- cough meds PRN -M Pneumonia IgM >4000 --wean oxygen to RA   Heavy smoker/history of waiting -- advised smoking cessation -- nicotine patch   Positive blood culture with staph species--contamination given diff species -- patient afebrile white count stable -- no other source of infection identified. Will repeat blood cultures and continue to monitor --Repeat BC neg in 24 hours   ADHD anxiety/depression -- continue SSRI   overall improving.  Has some cough. Sats 98% on room air. Will discharge to home patient advised to finish her antibiotics. Establish PCP.   Family communication : family at bedside Consults : none CODE STATUS: full DVT Prophylaxis : Lovenox     Diet recommendation:  Discharge Diet Orders (From admission, onward)     Start     Ordered   05/10/23 0000  Diet general        05/10/23 1011            DISCHARGE MEDICATION: Allergies as of 05/10/2023   No Known Allergies      Medication List     STOP taking these medications    amoxicillin-clavulanate 875-125 MG tablet Commonly known as: AUGMENTIN   methylPREDNISolone 4 MG Tbpk tablet Commonly known as: MEDROL DOSEPAK   promethazine-dextromethorphan 6.25-15 MG/5ML syrup Commonly known as: PROMETHAZINE-DM       TAKE these medications    albuterol 108 (90 Base) MCG/ACT inhaler Commonly known as: VENTOLIN HFA Inhale 1-2 puffs into the lungs every 6 (six) hours as needed for wheezing or shortness of breath. What changed: Another medication with the same name was added. Make sure you understand how and when to take each.   albuterol 108 (90 Base) MCG/ACT inhaler Commonly known as: VENTOLIN HFA Inhale 1-2 puffs into the lungs every 4 (four) hours as needed for wheezing or shortness of breath. What changed: You were already taking a medication with the same name, and this prescription was added. Make sure you understand how and when to take each.   ARIPiprazole 5 MG tablet Commonly known as: ABILIFY Take 1 tablet (5 mg total) by mouth daily.  azithromycin 250 MG tablet Commonly known as: ZITHROMAX Take daily as directed Start taking on: May 11, 2023   benzonatate 100 MG capsule Commonly known as: Lawyer Take 2 capsules (200 mg total) by mouth 3 (three) times daily. Take 1-2 tabs TID prn cough   brompheniramine-pseudoephedrine-DM 30-2-10 MG/5ML syrup Take 10 mLs by mouth 4 (four) times daily as needed.   FLUoxetine  20 MG capsule Commonly known as: PROZAC Take 1 capsule (20 mg total) by mouth daily.   guaifenesin 400 MG Tabs tablet Commonly known as: HUMIBID E Take 1 tablet 3 times daily as needed for chest congestion and cough   HYDROcodone-acetaminophen 5-325 MG tablet Commonly known as: NORCO/VICODIN Take 1 tablet by mouth every 6 (six) hours as needed for severe pain (pain score 7-10).   lidocaine 5 % Commonly known as: LIDODERM Place 1 patch onto the skin daily. Remove & Discard patch within 12 hours or as directed by MD Start taking on: May 11, 2023   NEXPLANON [place] Inject into the skin.   nicotine 21 mg/24hr patch Commonly known as: NICODERM CQ - dosed in mg/24 hours Place 1 patch (21 mg total) onto the skin daily. Start taking on: May 11, 2023   predniSONE 20 MG tablet Commonly known as: DELTASONE Take 1 tablet (20 mg total) by mouth daily with breakfast for 5 days. Start taking on: May 11, 2023   traZODone 50 MG tablet Commonly known as: DESYREL Take 1 tablet (50 mg total) by mouth at bedtime.        Discharge Exam: Filed Weights   05/06/23 2015  Weight: 90.7 kg  GENERAL:  28 y.o.-year-old patient with no acute distress. Obese, poor dentition LUNGS: coarse breath sounds bilaterally, no wheezing CARDIOVASCULAR: S1, S2 normal. No murmur   ABDOMEN: Soft, nontender, nondistended. Bowel sounds present.  EXTREMITIES: No  edema b/l.    NEUROLOGIC: nonfocal  patient is alert and awake   Condition at discharge: fair  The results of significant diagnostics from this hospitalization (including imaging, microbiology, ancillary and laboratory) are listed below for reference.   Imaging Studies: DG Chest 2 View Result Date: 05/06/2023 CLINICAL DATA:  Cough, diagnosed with bronchitis at urgent care January 16. Taking antibiotics. EXAM: CHEST - 2 VIEW COMPARISON:  PA Lat 10/16/2021 FINDINGS: The heart size and mediastinal contours are within normal limits. There is  central bronchial thickening consistent with a prior diagnosis of bronchitis. There are progressive increased interstitial markings in the lower lung fields which could relate to smoking or could indicate a mild interstitial pneumonitis. No focal consolidation, effusion or pneumothorax is seen. The visualized skeletal structures are unremarkable. IMPRESSION: 1. Central bronchial thickening consistent with a prior diagnosis of bronchitis. 2. Progressive increased interstitial markings in the lower lung fields which could relate to smoking or could indicate a mild interstitial pneumonitis. 3. No focal or segmental consolidation. Electronically Signed   By: Almira Bar M.D.   On: 05/06/2023 20:38    Microbiology: Results for orders placed or performed during the hospital encounter of 05/06/23  Resp panel by RT-PCR (RSV, Flu A&B, Covid) Anterior Nasal Swab     Status: None   Collection Time: 05/06/23 11:36 PM   Specimen: Anterior Nasal Swab  Result Value Ref Range Status   SARS Coronavirus 2 by RT PCR NEGATIVE NEGATIVE Final    Comment: (NOTE) SARS-CoV-2 target nucleic acids are NOT DETECTED.  The SARS-CoV-2 RNA is generally detectable in upper respiratory specimens during the acute phase of infection.  The lowest concentration of SARS-CoV-2 viral copies this assay can detect is 138 copies/mL. A negative result does not preclude SARS-Cov-2 infection and should not be used as the sole basis for treatment or other patient management decisions. A negative result may occur with  improper specimen collection/handling, submission of specimen other than nasopharyngeal swab, presence of viral mutation(s) within the areas targeted by this assay, and inadequate number of viral copies(<138 copies/mL). A negative result must be combined with clinical observations, patient history, and epidemiological information. The expected result is Negative.  Fact Sheet for Patients:   BloggerCourse.com  Fact Sheet for Healthcare Providers:  SeriousBroker.it  This test is no t yet approved or cleared by the [place] FDA and  has been authorized for detection and/or diagnosis of SARS-CoV-2 by FDA under an Emergency Use Authorization (EUA). This EUA will remain  in effect (meaning this test can be used) for the duration of the COVID-19 declaration under Section 564(b)(1) of the Act, 21 U.S.C.section 360bbb-3(b)(1), unless the authorization is terminated  or revoked sooner.       Influenza A by PCR NEGATIVE NEGATIVE Final   Influenza B by PCR NEGATIVE NEGATIVE Final    Comment: (NOTE) The Xpert Xpress SARS-CoV-2/FLU/RSV plus assay is intended as an aid in the diagnosis of influenza from Nasopharyngeal swab specimens and should not be used as a sole basis for treatment. Nasal washings and aspirates are unacceptable for Xpert Xpress SARS-CoV-2/FLU/RSV testing.  Fact Sheet for Patients: BloggerCourse.com  Fact Sheet for Healthcare Providers: SeriousBroker.it  This test is not yet approved or cleared by the [place] FDA and has been authorized for detection and/or diagnosis of SARS-CoV-2 by FDA under an Emergency Use Authorization (EUA). This EUA will remain in effect (meaning this test can be used) for the duration of the COVID-19 declaration under Section 564(b)(1) of the Act, 21 U.S.C. section 360bbb-3(b)(1), unless the authorization is terminated or revoked.     Resp Syncytial Virus by PCR NEGATIVE NEGATIVE Final    Comment: (NOTE) Fact Sheet for Patients: BloggerCourse.com  Fact Sheet for Healthcare Providers: SeriousBroker.it  This test is not yet approved or cleared by the [place] FDA and has been authorized for detection and/or diagnosis of SARS-CoV-2 by FDA under an Emergency Use  Authorization (EUA). This EUA will remain in effect (meaning this test can be used) for the duration of the COVID-19 declaration under Section 564(b)(1) of the Act, 21 U.S.C. section 360bbb-3(b)(1), unless the authorization is terminated or revoked.  Performed at [facility], [address] [place], [place] [address]   Blood culture (routine x 2)     Status: Abnormal (Preliminary result)   Collection Time: 05/07/23 12:15 AM   Specimen: BLOOD  Result Value Ref Range Status   Specimen Description   Final    BLOOD LEFT ARM Performed at [facility], [address], [place], [place] [address]    Special Requests   Final    BOTTLES DRAWN AEROBIC AND ANAEROBIC Blood Culture results may not be optimal due to an inadequate volume of blood received in culture bottles Performed at [facility], [address] [place], [place] [address]    Culture  Setup Time   Final    GRAM POSITIVE COCCI AEROBIC BOTTLE ONLY CRITICAL RESULT CALLED TO, READ BACK BY AND VERIFIED WITH: [facility] NAZARI AT 1220 05/08/23.PMF Performed at [facility], [address], [place], [place] [address]    Culture (A)  Final  STAPHYLOCOCCUS SPECIES (COAGULASE NEGATIVE) CULTURE REINCUBATED FOR BETTER GROWTH Performed at [facility], [address] [place], [place] [address]    Report Status PENDING  Incomplete  Blood culture (routine x 2)     Status: Abnormal   Collection Time: 05/07/23  1:11 AM   Specimen: BLOOD LEFT ARM  Result Value Ref Range Status   Specimen Description   Final    BLOOD LEFT ARM Performed at [facility], [address], [place], [place] [address]    Special Requests   Final    BOTTLES DRAWN AEROBIC AND ANAEROBIC Blood Culture results may not be optimal due to an inadequate volume of blood received in culture bottles Performed at [facility], [address] [place], [place] [address]    Culture  Setup Time   Final    GRAM  POSITIVE COCCI IN BOTH AEROBIC AND ANAEROBIC BOTTLES CRITICAL RESULT CALLED TO, READ BACK BY AND VERIFIED WITH: [facility]ALID NAZARI 1541 05/07/23 MU    Culture (A)  Final    STAPHYLOCOCCUS PSEUDINTERMEDIUS THE SIGNIFICANCE OF ISOLATING THIS ORGANISM FROM A SINGLE SET OF BLOOD CULTURES WHEN MULTIPLE SETS ARE DRAWN IS UNCERTAIN. PLEASE NOTIFY THE MICROBIOLOGY DEPARTMENT WITHIN ONE WEEK IF SPECIATION AND SENSITIVITIES ARE REQUIRED. Performed at [facility], [address] [place], [place] [address]    Report Status 05/09/2023 FINAL  Final  Blood Culture ID Panel (Reflexed)     Status: Abnormal   Collection Time: 05/07/23  1:11 AM  Result Value Ref Range Status   Enterococcus faecalis NOT DETECTED NOT DETECTED Final   Enterococcus Faecium NOT DETECTED NOT DETECTED Final   Listeria monocytogenes NOT DETECTED NOT DETECTED Final   Staphylococcus species DETECTED (A) NOT DETECTED Final    Comment: CRITICAL RESULT CALLED TO, READ BACK BY AND VERIFIED WITH: WALID NAZARI 1541 05/07/23 MU    Staphylococcus aureus (BCID) NOT DETECTED NOT DETECTED Final   Staphylococcus epidermidis NOT DETECTED NOT DETECTED Final   Staphylococcus lugdunensis NOT DETECTED NOT DETECTED Final   Streptococcus species NOT DETECTED NOT DETECTED Final   Streptococcus agalactiae NOT DETECTED NOT DETECTED Final   Streptococcus pneumoniae NOT DETECTED NOT DETECTED Final   Streptococcus pyogenes NOT DETECTED NOT DETECTED Final   A.calcoaceticus-baumannii NOT DETECTED NOT DETECTED Final   Bacteroides fragilis NOT DETECTED NOT DETECTED Final   Enterobacterales NOT DETECTED NOT DETECTED Final   Enterobacter cloacae complex NOT DETECTED NOT DETECTED Final   Escherichia coli NOT DETECTED NOT DETECTED Final   Klebsiella aerogenes NOT DETECTED NOT DETECTED Final   Klebsiella oxytoca NOT DETECTED NOT DETECTED Final   Klebsiella pneumoniae NOT DETECTED NOT DETECTED Final   Proteus species NOT DETECTED NOT DETECTED Final    Salmonella species NOT DETECTED NOT DETECTED Final   Serratia marcescens NOT DETECTED NOT DETECTED Final   Haemophilus influenzae NOT DETECTED NOT DETECTED Final   Neisseria meningitidis NOT DETECTED NOT DETECTED Final   Pseudomonas aeruginosa NOT DETECTED NOT DETECTED Final   Stenotrophomonas maltophilia NOT DETECTED NOT DETECTED Final   Candida albicans NOT DETECTED NOT DETECTED Final   Candida auris NOT DETECTED NOT DETECTED Final   Candida glabrata NOT DETECTED NOT DETECTED Final   Candida krusei NOT DETECTED NOT DETECTED Final   Candida parapsilosis NOT DETECTED NOT DETECTED Final   Candida tropicalis NOT DETECTED NOT DETECTED Final   Cryptococcus neoformans/gattii NOT DETECTED NOT DETECTED Final    Comment: Performed at [facility], [address] [place], [place] [address]  Expectorated Sputum Assessment w Gram Stain, Rflx to Resp Cult     Status: None   Collection Time: 05/07/23  2:14 PM   Specimen: Sputum  Result Value Ref Range Status   Specimen Description SPUTUM  Final   Special Requests NONE  Final   Sputum evaluation   Final    THIS SPECIMEN IS ACCEPTABLE FOR SPUTUM CULTURE Performed at [facility], [address], [place], [place] [address]    Report Status 05/08/2023 FINAL  Final  Culture, Respiratory w Gram Stain     Status: None (Preliminary result)   Collection Time: 05/07/23  2:14 PM   Specimen: SPU  Result Value Ref Range Status   Specimen Description   Final    SPUTUM Performed at [facility], [address], [place], [place] [address]    Special Requests   Final    NONE Reflexed from [phone] Performed at [facility], [address] [place], [place] [address]    Gram Stain   Final    RARE WBC PRESENT, PREDOMINANTLY PMN NO ORGANISMS SEEN    Culture   Final    CULTURE REINCUBATED FOR BETTER GROWTH Performed at [facility], [address], [place], [place] [address]    Report Status PENDING  Incomplete   MRSA Next Gen by PCR, Nasal     Status: None   Collection Time: 05/07/23  3:49 PM   Specimen: Nasal Mucosa; Nasal Swab  Result Value Ref Range Status   MRSA by PCR Next Gen NOT DETECTED NOT DETECTED Final    Comment: (NOTE) The GeneXpert MRSA Assay (FDA approved for NASAL specimens only), is one component of a comprehensive MRSA colonization surveillance program. It is not intended to diagnose MRSA infection nor to guide or monitor treatment for MRSA infections. Test performance is not FDA approved in patients less than 31 years old. Performed at [facility], [address] [place], [place] [address]   Culture, blood (Routine X 2) w Reflex to ID Panel     Status: None (Preliminary result)   Collection Time: 05/08/23  1:47 PM   Specimen: BLOOD  Result Value Ref Range Status   Specimen Description BLOOD RIGHT ANTECUBITAL  Final   Special Requests   Final    BOTTLES DRAWN AEROBIC AND ANAEROBIC Blood Culture adequate volume   Culture   Final    NO GROWTH 2 DAYS Performed at [facility], [address] [place], [place] [address]    Report Status PENDING  Incomplete  Culture, blood (Routine X 2) w Reflex to ID Panel     Status: None (Preliminary result)   Collection Time: 05/08/23  1:47 PM   Specimen: BLOOD  Result Value Ref Range Status   Specimen Description BLOOD LEFT ANTECUBITAL  Final   Special Requests   Final    BOTTLES DRAWN AEROBIC AND ANAEROBIC Blood Culture adequate volume   Culture   Final    NO GROWTH 2 DAYS Performed at [facility], [address] [place], [place] [address]    Report Status PENDING  Incomplete    Labs: CBC: Recent Labs  Lab 05/06/23 2336 05/08/23 0415  WBC 9.5 11.5*  NEUTROABS 5.5  --   HGB 13.0 12.2  HCT 37.8 33.6*  MCV 97.7 97.4  PLT 299 339   Basic Metabolic Panel: Recent Labs  Lab 05/06/23 2336  NA 139  K 3.5  CL 102  CO2 26  GLUCOSE 121*  BUN 6  CREATININE 0.90  CALCIUM 8.8*      Discharge time spent: greater than 30 minutes.  Signed: Enedina Finner, MD [facility] 05/10/2023

## 2023-05-10 NOTE — Discharge Instructions (Addendum)
Abstain from vaping and smoking Establish PCP in the area

## 2023-05-11 ENCOUNTER — Telehealth (HOSPITAL_COMMUNITY): Payer: Self-pay | Admitting: Pharmacy Technician

## 2023-05-11 LAB — CULTURE, RESPIRATORY W GRAM STAIN

## 2023-05-11 NOTE — Telephone Encounter (Signed)
Pharmacy Patient Advocate Encounter   Received notification from Fax that prior authorization for Lidocaine 5% patches is required/requested.   Insurance verification completed.   The patient is insured through [facility] .   Per test claim: PA required; PA submitted to above mentioned insurance via CoverMyMeds Key/confirmation #/EOC [id number] Status is pending

## 2023-05-12 LAB — CULTURE, BLOOD (ROUTINE X 2)

## 2023-05-12 NOTE — Telephone Encounter (Signed)
Pharmacy Patient Advocate Encounter  Received notification from [facility] that Prior Authorization for Lidocaine 5% patches  has been APPROVED from 05/11/2023 to 05/10/2024   PA #/Case ID/Reference #: [id number]

## 2023-05-13 LAB — CULTURE, BLOOD (ROUTINE X 2)
Culture: NO GROWTH
Culture: NO GROWTH
Special Requests: ADEQUATE
Special Requests: ADEQUATE

## 2023-07-04 ENCOUNTER — Encounter (HOSPITAL_COMMUNITY): Payer: Self-pay | Admitting: Emergency Medicine

## 2023-07-04 ENCOUNTER — Other Ambulatory Visit: Payer: Self-pay

## 2023-07-04 ENCOUNTER — Ambulatory Visit (HOSPITAL_COMMUNITY)
Admission: EM | Admit: 2023-07-04 | Discharge: 2023-07-04 | Disposition: A | Discharge status: Home / Self Care | Attending: Emergency Medicine | Admitting: Emergency Medicine

## 2023-07-04 DIAGNOSIS — K0889 Other specified disorders of teeth and supporting structures: Secondary | ICD-10-CM

## 2023-07-04 MED ORDER — LIDOCAINE VISCOUS HCL 2 % MT SOLN: 15.0000 mL | OROMUCOSAL | 0 refills | Status: DC | PRN
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
## 2023-07-04 NOTE — Discharge Instructions (Signed)
 Please continue antibiotic.  It can take 3 full days of the medicine to notice improvement.  Lidocaine can be used as swish and spit in the mouth to help numb the area.  Please call dentist first thing Monday morning

## 2023-07-04 NOTE — ED Triage Notes (Signed)
 Pt having right upper and lower dental pain for a week. She is on PO abx prescribed by her dentist 4 days ago with no relief.

## 2023-08-23 ENCOUNTER — Other Ambulatory Visit: Payer: Self-pay

## 2023-08-23 ENCOUNTER — Emergency Department
Admission: EM | Admit: 2023-08-23 | Discharge: 2023-08-23 | Disposition: A | Discharge status: Home / Self Care | Attending: Emergency Medicine | Admitting: Emergency Medicine

## 2023-08-23 ENCOUNTER — Emergency Department

## 2023-08-23 DIAGNOSIS — R220 Localized swelling, mass and lump, head: Secondary | ICD-10-CM | POA: Diagnosis present

## 2023-08-23 DIAGNOSIS — K122 Cellulitis and abscess of mouth: Secondary | ICD-10-CM | POA: Insufficient documentation

## 2023-08-23 LAB — CBC WITH DIFFERENTIAL/PLATELET
Abs Immature Granulocytes: 0.02 10*3/uL (ref 0.00–0.07)
Basophils Absolute: 0 10*3/uL (ref 0.0–0.1)
Basophils Relative: 0 %
Eosinophils Absolute: 0.1 10*3/uL (ref 0.0–0.5)
Eosinophils Relative: 2 %
HCT: 37.3 % (ref 36.0–46.0)
Hemoglobin: 12.9 g/dL (ref 12.0–15.0)
Immature Granulocytes: 0 %
Lymphocytes Relative: 21 %
Lymphs Abs: 1.4 10*3/uL (ref 0.7–4.0)
MCH: 30.6 pg (ref 26.0–34.0)
MCHC: 34.6 g/dL (ref 30.0–36.0)
MCV: 88.6 fL (ref 80.0–100.0)
Monocytes Absolute: 0.4 10*3/uL (ref 0.1–1.0)
Monocytes Relative: 7 %
Neutro Abs: 4.7 10*3/uL (ref 1.7–7.7)
Neutrophils Relative %: 70 %
Platelets: 201 10*3/uL (ref 150–400)
RBC: 4.21 MIL/uL (ref 3.87–5.11)
RDW: 12 % (ref 11.5–15.5)
WBC: 6.7 10*3/uL (ref 4.0–10.5)
nRBC: 0 % (ref 0.0–0.2)

## 2023-08-23 LAB — BASIC METABOLIC PANEL WITH GFR
Anion gap: 8 (ref 5–15)
BUN: 5 mg/dL — ABNORMAL LOW (ref 6–20)
CO2: 27 mmol/L (ref 22–32)
Calcium: 9.3 mg/dL (ref 8.9–10.3)
Chloride: 104 mmol/L (ref 98–111)
Creatinine, Ser: 0.93 mg/dL (ref 0.44–1.00)
GFR, Estimated: >60 mL/min (ref >60)
Glucose, Bld: 104 mg/dL — ABNORMAL HIGH (ref 70–99)
Potassium: 3.3 mmol/L — ABNORMAL LOW (ref 3.5–5.1)
Sodium: 139 mmol/L (ref 135–145)

## 2023-08-23 MED ORDER — ACETAMINOPHEN 325 MG PO TABS
650.0000 mg | ORAL_TABLET | Freq: Once | ORAL | Status: AC
  Administered 2023-08-23: 650 mg via ORAL
  Filled 2023-08-23: qty 2

## 2023-08-23 MED ORDER — AMOXICILLIN-POT CLAVULANATE 875-125 MG PO TABS: 1.0000 | ORAL_TABLET | Freq: Two times a day (BID) | ORAL | 0 refills | Status: AC

## 2023-08-23 MED ORDER — HYDROCODONE-ACETAMINOPHEN 7.5-325 MG/15ML PO SOLN: 15.0000 mL | Freq: Four times a day (QID) | ORAL | 0 refills | Status: DC | PRN

## 2023-08-23 MED ORDER — AMOXICILLIN-POT CLAVULANATE 875-125 MG PO TABS
1.0000 | ORAL_TABLET | Freq: Once | ORAL | Status: AC
  Administered 2023-08-23: 1 via ORAL
  Filled 2023-08-23: qty 1

## 2023-08-23 MED ORDER — LIDOCAINE-EPINEPHRINE 2 %-1:100000 IJ SOLN
1.7000 mL | Freq: Once | INTRAMUSCULAR | Status: AC
  Administered 2023-08-23: 1.7 mL
  Filled 2023-08-23: qty 1.7

## 2023-08-23 MED ORDER — LIDOCAINE-EPINEPHRINE-TETRACAINE (LET) TOPICAL GEL: 3.0000 mL | Freq: Once | TOPICAL | Status: DC

## 2023-08-23 MED ORDER — HYDROCODONE-ACETAMINOPHEN 10-325 MG PO TABS
1.0000 | ORAL_TABLET | Freq: Four times a day (QID) | ORAL | 0 refills | Status: DC | PRN
Start: 2023-08-23 — End: 2023-11-16

## 2023-08-23 MED ORDER — HYDROMORPHONE HCL 1 MG/ML IJ SOLN
1.0000 mg | Freq: Once | INTRAMUSCULAR | Status: AC
  Administered 2023-08-23: 1 mg via INTRAVENOUS
  Filled 2023-08-23: qty 1

## 2023-08-23 MED ORDER — HYDROMORPHONE HCL 1 MG/ML IJ SOLN: 1.0000 mg | Freq: Once | INTRAMUSCULAR | Status: DC

## 2023-08-23 NOTE — ED Notes (Signed)
 ED Provider at bedside.

## 2023-08-23 NOTE — Discharge Instructions (Addendum)
 You have been diagnosed with cellulitis secondary to dental cavity.  Please take Augmentin  1 tablet by mouth every 12 hours after main meals for 7 days.  Please take Norco 1 tablet by mouth every 6 hours as needed for pain.  These call tomorrow to the dentist to make an appointment.  Please come back to ED or go to your PCP if you have new symptoms or symptoms worsen

## 2023-08-23 NOTE — ED Provider Notes (Addendum)
 [facility] Provider Note    Event Date/Time   First MD Initiated Contact with Patient 08/23/23 2106     (approximate)   History   Facial Swelling    HPI Christina Warner is a 28 y.o. female    with a past medical history of dental pain, acute cystitis, generalized anxiety, PTSD, major depressive disorder, tendinitis of ankle, who presents to the ED complaining of right upper maxillar edema, pain.. According to the patient, symptoms started 24 hours ago with upper right side dental pain, this morning patient woke up with facial edema, today in triage patient was febrile.  States she was taking 1 capsule of amoxicillin  today that she had from previous treatments.  Patient has been seen by dentist who diagnosed infection and prescribed antibiotics.       Physical Exam   Triage Vital Signs: ED Triage Vitals  Encounter Vitals Group     BP 08/23/23 2047 (!) 111/56     Systolic BP Percentile --      Diastolic BP Percentile --      Pulse Rate 08/23/23 2043 97     Resp 08/23/23 2043 17     Temp 08/23/23 2046 99.6 F (37.6 C)     Temp Source 08/23/23 2043 Oral     SpO2 08/23/23 2043 98 %     Weight 08/23/23 2044 170 lb (77.1 kg)     Height 08/23/23 2044 5\' 1"  (1.549 m)     Head Circumference --      Peak Flow --      Pain Score 08/23/23 2044 8     Pain Loc --      Pain Education --      Exclude from Growth Chart --     Most recent vital signs: Vitals:   08/23/23 2047 08/23/23 2200  BP: (!) 111/56   Pulse:    Resp:    Temp:  (!) 100.6 F (38.1 C)  SpO2:       Constitutional: Alert, NAD. Able to speak in complete sentences without cough or dyspnea  Eyes: Conjunctivae are normal.  Head: Atraumatic. Nose: No congestion/rhinnorhea. Mouth/Throat: Mucous membranes are moist.   Upper right at the level of first molar presence of white white collection in the gums, tender to palpation Neck: Painless ROM. Supple. No JVD, nodes, thyromegaly   Cardiovascular:   Good peripheral circulation.RRR no murmurs, gallops, rubs  Respiratory: Normal respiratory effort.  No retractions. Clear to auscultation bilaterally without wheezing or crackles  Gastrointestinal: Soft and nontender.  Musculoskeletal:  no deformity Neurologic:  MAE spontaneously. No gross focal neurologic deficits are appreciated.  Skin:  Skin is warm, dry and intact. No rash noted. Psychiatric: Mood and affect are normal. Speech and behavior are normal.    ED Results / Procedures / Treatments   Labs (all labs ordered are listed, but only abnormal results are displayed) Labs Reviewed  BASIC METABOLIC PANEL WITH GFR - Abnormal; Notable for the following components:      Result Value   Potassium 3.3 (*)    Glucose, Bld 104 (*)    BUN 5 (*)    All other components within normal limits  CBC WITH DIFFERENTIAL/PLATELET     EKG     RADIOLOGY I independently reviewed and interpreted imaging and agree with radiologists findings.      PROCEDURES:  Critical Care performed:   .Nerve Block  Date/Time: 08/23/2023 10:37 PM  Performed by: Awilda Lennox, PA-C Authorized  by: Awilda Lennox, PA-C   Consent:    Consent obtained:  Verbal   Consent given by:  Patient   Risks, benefits, and alternatives were discussed: yes     Risks discussed:  Allergic reaction and infection Universal protocol:    Procedure explained and questions answered to patient or proxy's satisfaction: yes     Patient identity confirmed:  Verbally with patient Indications:    Indications:  Pain relief (Right upper maxillary) Procedure details:    Block needle gauge:  27 G   Anesthetic injected:  Lidocaine  2% WITH epi   Steroid injected:  None   Additive injected:  None   Injection procedure:  Anatomic landmarks identified   Paresthesia:  None Post-procedure details:    Dressing:  None   Outcome:  Pain relieved    MEDICATIONS ORDERED IN ED: Medications  acetaminophen   (TYLENOL ) tablet 650 mg (650 mg Oral Given 08/23/23 2218)  amoxicillin -clavulanate (AUGMENTIN ) 875-125 MG per tablet 1 tablet (1 tablet Oral Given 08/23/23 2218)  lidocaine -EPINEPHrine  (XYLOCAINE  W/EPI) 2 %-1:100000 (with pres) injection 1.7 mL (1.7 mLs Infiltration Given by Other 08/23/23 2219)  HYDROmorphone  (DILAUDID ) injection 1 mg (1 mg Intravenous Given 08/23/23 2218)      IMPRESSION / MDM / ASSESSMENT AND PLAN / ED COURSE  I reviewed the triage vital signs and the nursing notes.  Differential diagnosis includes, but is not limited to, dental abscess, cellulitis, foreign body  Patient's presentation is most consistent with acute complicated illness / injury requiring diagnostic workup.  Patient's diagnosis is consistent with cellulitis secondary to right upper cavity. I independently reviewed and interpreted imaging and agree with radiologists findings ruling out abscess. I did review the patient's allergies and medications.During admission patient received Dilaudid , Augmentin , acetaminophen .  The patient is in stable and satisfactory condition for discharge home  Patient will be discharged home with prescriptions for Augmentin , Norco. Patient is to follow up with dentist as needed or otherwise directed. Patient is given ED precautions to return to the ED for any worsening or new symptoms. Discussed plan of care with patient, answered all of patient's questions, Patient agreeable to plan of care. Advised patient to take medications according to the instructions on the label. Discussed possible side effects of new medications. Patient verbalized understanding.    FINAL CLINICAL IMPRESSION(S) / ED DIAGNOSES   Final diagnoses:  Cellulitis of mouth     Rx / DC Orders   ED Discharge Orders          Ordered    amoxicillin -clavulanate (AUGMENTIN ) 875-125 MG tablet  2 times daily        08/23/23 2242    HYDROcodone -acetaminophen  (HYCET) 7.5-325 mg/15 ml solution  4 times daily PRN,    Status:  Discontinued        08/23/23 2242    HYDROcodone -acetaminophen  (NORCO) 10-325 MG tablet  Every 6 hours PRN        08/23/23 2243             Note:  This document was prepared using Dragon voice recognition software and may include unintentional dictation errors.   Awilda Lennox, PA-C 08/23/23 2244    Awilda Lennox, PA-C 08/23/23 2244    Kandee Orion, MD 08/23/23 2251

## 2023-08-23 NOTE — ED Notes (Signed)
 Pt to radiology at this time.

## 2023-08-23 NOTE — ED Triage Notes (Signed)
 POV with CC of R sided facial swelling that started this am. Hx of upper tooth infection with intermittent swelling but never to this extent. Denies fevers at home. Pt A&Ox4 and ambulatory to triage.

## 2023-09-07 ENCOUNTER — Other Ambulatory Visit: Payer: Self-pay

## 2023-09-07 ENCOUNTER — Emergency Department
Admission: EM | Admit: 2023-09-07 | Discharge: 2023-09-07 | Disposition: A | Discharge status: Home / Self Care | Attending: Emergency Medicine | Admitting: Emergency Medicine

## 2023-09-07 ENCOUNTER — Emergency Department

## 2023-09-07 DIAGNOSIS — M25512 Pain in left shoulder: Secondary | ICD-10-CM | POA: Diagnosis present

## 2023-09-07 MED ORDER — KETOROLAC TROMETHAMINE 30 MG/ML IJ SOLN
30.0000 mg | Freq: Once | INTRAMUSCULAR | Status: AC
  Administered 2023-09-07: 30 mg via INTRAMUSCULAR
  Filled 2023-09-07: qty 1

## 2023-09-07 NOTE — ED Provider Notes (Signed)
 [facility] Emergency Department Provider Note     Event Date/Time   First MD Initiated Contact with Patient 09/07/23 [phone]     (approximate)   History   Shoulder Pain   HPI  Christina Warner is a 28 y.o. female presents to the ED with atraumatic left shoulder pain x 1.5 weeks ago.  Patient describes severe sharp pain that is worse with movement.  No radiation.  She reports this could have happened while playing with her kids at home as she reports picking them up often.  Patient was evaluated at [facility] today where she was given a prescription for tramadol  and a muscle relaxer.  She reports the pain has not improved.  She declines shortness of breath, chest pain, weakness and numbness and tingling.     Physical Exam   Triage Vital Signs: ED Triage Vitals [09/07/23 1915]  Encounter Vitals Group     BP (!) 136/92     Systolic BP Percentile      Diastolic BP Percentile      Pulse Rate 89     Resp 16     Temp 98.3 F (36.8 C)     Temp Source Oral     SpO2 100 %     Weight 190 lb (86.2 kg)     Height 5\' 1"  (1.549 m)     Head Circumference      Peak Flow      Pain Score 9     Pain Loc      Pain Education      Exclude from Growth Chart     Most recent vital signs: Vitals:   09/07/23 1915  BP: (!) 136/92  Pulse: 89  Resp: 16  Temp: 98.3 F (36.8 C)  SpO2: 100%    General Awake, no distress.  HEENT NCAT.  CV:  Good peripheral perfusion.  RRR RESP:  Normal effort.  LCTAB ABD:  No distention.  Other:  No visible deformity of left shoulder.  Tenderness to anterior lateral pectoralis major muscle. PROM>AROM.  Neurovascular status intact all throughout.   ED Results / Procedures / Treatments   Labs (all labs ordered are listed, but only abnormal results are displayed) Labs Reviewed - No data to display  RADIOLOGY  I personally viewed and evaluated these images as part of my medical decision making, as well as reviewing the written  report by the radiologist.  ED Provider Interpretation: Normal x-ray of shoulder  DG Shoulder Left Result Date: 09/07/2023 CLINICAL DATA:  Left shoulder pain for 1-1/2 weeks. EXAM: LEFT SHOULDER - 2+ VIEW COMPARISON:  None Available. FINDINGS: There is no evidence of fracture or dislocation. The alignment and joint spaces are normal. There is no evidence of arthropathy or other focal bone abnormality. Soft tissues are unremarkable. IMPRESSION: Negative radiographs of the left shoulder. Electronically Signed   By: Chadwick Colonel M.D.   On: 09/07/2023 19:50    PROCEDURES:  Critical Care performed: No  Procedures   MEDICATIONS ORDERED IN ED: Medications  ketorolac  (TORADOL ) 30 MG/ML injection 30 mg (30 mg Intramuscular Given 09/07/23 2007)     IMPRESSION / MDM / ASSESSMENT AND PLAN / ED COURSE  I reviewed the triage vital signs and the nursing notes.                                 28 y.o. female presents to the  emergency department for evaluation and treatment of left shoulder pain without injury. See HPI for further details.   Differential diagnosis includes, but is not limited to fracture, dislocation, muscle strain, radiculopathy  Patient's presentation is most consistent with acute complicated illness / injury requiring diagnostic workup.  Patient is alert and oriented.  He is hemodynamically stable and well-appearing.  Physical exam findings are stated above.  .  X-rays are reassuring of any fractures or dislocations.  Patient is placed in a sling and encouraged to be immobilized for 1 week while performing range of motion exercises to prevent stiffness.  Patient request more pain medication however she was prescribed tramadol  and Flexeril  at her earlier visit at [facility].  I encouraged patient to continue this regimen.  Advised to follow-up with orthopedic if symptoms do not improve in 1 week.  RICE education therapy provided.  Patient stable in satisfactory condition for  discharge home.  ED return precaution discussed.  FINAL CLINICAL IMPRESSION(S) / ED DIAGNOSES   Final diagnoses:  Acute pain of left shoulder     Rx / DC Orders   ED Discharge Orders     None      Note:  This document was prepared using Dragon voice recognition software and may include unintentional dictation errors.    Phyllis Breeze, Nkosi Cortright A, PA-C 09/07/23 2242    Arline Bennett, MD 09/07/23 [phone]

## 2023-09-07 NOTE — Discharge Instructions (Signed)
 You were evaluated in the ED for left shoulder pain.  Your x-rays are reassuring do not show any signs of acute bony abnormalities.  We have provided you with a sling and you are encouraged to remain in the sling for 1 week.  Be sure to include shoulder range of motion exercises to prevent stiffness.  Do not lift heavy objects.  You were prescribed tramadol  and a muscle relaxer from [facility] please continue this pain regimen at home.  You can alternate applying ice and a heating pad to the affected area for 10 to 20 minutes at a time.  If symptoms do not improve please follow-up with orthopedic for further evaluation.

## 2023-09-07 NOTE — ED Notes (Addendum)
 See triage note. Pt A&Ox4, appears tearful due to pain. Pt states there was no specific MOI. Pain began approx a week and a half ago and has been consistent. Pt states that the pain increased today and has become intollerable. Pt states she was given tramadol  and a "muscle relaxer" at 3pm today which did not help the pain.

## 2023-09-07 NOTE — ED Triage Notes (Signed)
 Pt presents via POV c/o left sided shoulder pain x1.5 weeks. Denies specific injury. Reports pain with movement. Ambulatory to triage. A&O x4.

## 2023-10-07 ENCOUNTER — Other Ambulatory Visit: Payer: Self-pay

## 2023-10-07 DIAGNOSIS — A599 Trichomoniasis, unspecified: Secondary | ICD-10-CM | POA: Diagnosis not present

## 2023-10-07 DIAGNOSIS — J45909 Unspecified asthma, uncomplicated: Secondary | ICD-10-CM | POA: Insufficient documentation

## 2023-10-07 DIAGNOSIS — M542 Cervicalgia: Secondary | ICD-10-CM | POA: Diagnosis present

## 2023-10-07 NOTE — ED Triage Notes (Signed)
 Pt was seen at [facility] yesterday, pt states she was dx with yeast infection. Pt reports she has taken 1 dose of diflucan  yesterday. Pt also c/o neck pain has known cervical issues, denies new injury

## 2023-10-08 ENCOUNTER — Emergency Department
Admission: EM | Admit: 2023-10-08 | Discharge: 2023-10-08 | Disposition: A | Discharge status: Home / Self Care | Attending: Emergency Medicine | Admitting: Emergency Medicine

## 2023-10-08 DIAGNOSIS — A599 Trichomoniasis, unspecified: Secondary | ICD-10-CM

## 2023-10-08 LAB — WET PREP, GENITAL
Clue Cells Wet Prep HPF POC: NONE SEEN
Sperm: NONE SEEN
WBC, Wet Prep HPF POC: >=10 — AB (ref <10)
Yeast Wet Prep HPF POC: NONE SEEN

## 2023-10-08 LAB — CHLAMYDIA/NGC RT PCR (ARMC ONLY)
Chlamydia Tr: NOT DETECTED
N gonorrhoeae: NOT DETECTED

## 2023-10-08 MED ORDER — KETOROLAC TROMETHAMINE 15 MG/ML IJ SOLN
15.0000 mg | Freq: Once | INTRAMUSCULAR | Status: AC
  Administered 2023-10-08: 15 mg via INTRAMUSCULAR
  Filled 2023-10-08: qty 1

## 2023-10-08 MED ORDER — HYDROCODONE-ACETAMINOPHEN 7.5-325 MG PO TABS: 1.0000 | ORAL_TABLET | Freq: Four times a day (QID) | ORAL | 0 refills | Status: AC | PRN

## 2023-10-08 MED ORDER — METRONIDAZOLE 500 MG PO TABS: 500.0000 mg | ORAL_TABLET | Freq: Two times a day (BID) | ORAL | 0 refills | Status: AC

## 2023-10-08 NOTE — Discharge Instructions (Addendum)
 Take the Flagyl  as prescribed and finish the full course.  You may take the hydrocodone  as needed over the next few days for pain.  Follow-up with your OB/GYN.  In the meantime, return to the ER for new, worsening, or persistent severe vaginal, pelvic, or abdominal pain, swelling around the vagina, redness, discharge, fever, any numbness or tingling in your arms, new or worsening neck pain, or any other new or worsening symptoms that concern you.

## 2023-10-08 NOTE — ED Provider Notes (Signed)
 [facility] Provider Note    Event Date/Time   First MD Initiated Contact with Patient 10/08/23 0139     (approximate)   History   Vaginal Pain   HPI  Christina Warner is a 28 y.o. female with a history of asthma, ADHD, and anxiety who presents with vaginal pain and neck pain.  The patient states that she has had vaginal pain for last several days along with some mild discharge.  She was seen by OB/GYN yesterday and diagnosed with a yeast infection.  She was given a dose of Diflucan  but the pain is getting worse.  She denies any increased discharge.  She has no bleeding.  She also reports neck pain which is chronic but has been exacerbated over the last few days.  She denies any weakness or numbness.  She has no new trauma.  I reviewed the past medical records.  The patient was admitted to the hospital service in January with an asthma exacerbation.  I am able to see that she was seen by OB/GYN on 6/24 but not the evaluation or results.   Physical Exam   Triage Vital Signs: ED Triage Vitals  Encounter Vitals Group     BP 10/07/23 2332 123/87     Girls Systolic BP Percentile --      Girls Diastolic BP Percentile --      Boys Systolic BP Percentile --      Boys Diastolic BP Percentile --      Pulse Rate 10/07/23 2332 (!) 110     Resp 10/07/23 2332 18     Temp 10/07/23 2332 98.2 F (36.8 C)     Temp src --      SpO2 10/07/23 2332 99 %     Weight 10/07/23 2331 184 lb (83.5 kg)     Height 10/07/23 2331 5' 1 (1.549 m)     Head Circumference --      Peak Flow --      Pain Score 10/07/23 2331 8     Pain Loc --      Pain Education --      Exclude from Growth Chart --     Most recent vital signs: Vitals:   10/08/23 0415 10/08/23 0518  BP: 118/78   Pulse: 88 97  Resp: 16   Temp:    SpO2: 96% 97%     General: Awake, no distress.  CV:  Good peripheral perfusion.  Resp:  Normal effort.  Abd:  No distention.  Other:  No midline spinal tenderness.    Mild bilateral paraspinal tenderness in the neck.  Motor intact in all extremities.  On pelvic exam, scant discharge.  No blood.  Labia tender bilaterally with faint erythema but no induration.  No palpable masses or fluctuance.  No open wounds or lesions.   ED Results / Procedures / Treatments   Labs (all labs ordered are listed, but only abnormal results are displayed) Labs Reviewed  WET PREP, GENITAL - Abnormal; Notable for the following components:      Result Value   Trich, Wet Prep PRESENT (*)    WBC, Wet Prep HPF POC >=10 (*)    All other components within normal limits  CHLAMYDIA/NGC RT PCR ([facility] ONLY)               EKG    RADIOLOGY    PROCEDURES:  Critical Care performed: No  Procedures   MEDICATIONS ORDERED IN ED: Medications  ketorolac  (  TORADOL ) 15 MG/ML injection 15 mg (15 mg Intramuscular Given 10/08/23 0234)     IMPRESSION / MDM / ASSESSMENT AND PLAN / ED COURSE  I reviewed the triage vital signs and the nursing notes.  28 year old female with PMH as noted above presents with persistent vaginal pain after being diagnosed with and treated for a yeast infection, also with acute on chronic neck pain.  Differential diagnosis includes, but is not limited to:  Vaginal pain: The pain appears focused on the labia rather than internally, it appears out of proportion to what I would expect with a yeast infection.  There is scant discharge on speculum exam.  Due to the vulvar pain the patient would not be able to tolerate a digital exam, but did not have internal tenderness on speculum exam.  Differential includes yeast infection, other vaginal infection, folliculitis, cellulitis.  I do not suspect PID.  We have obtained a wet prep and GC/CT swab.  Neck pain: This appears acute on chronic.  The patient has not had any trauma.  Neurologic exam is normal.  She has no concerning symptoms.  We will give Toradol  for analgesia.  Patient's presentation is most consistent  with acute complicated illness / injury requiring diagnostic workup.  ----------------------------------------- 6:08 AM on 10/08/2023 -----------------------------------------  The patient reports improved pain after Toradol .  She was quite sleepy so I let her sleep for the last few hours.  On reassessment she is comfortable appearing.  Swabs are positive for trichomoniasis, and negative for yeast.  I counseled the patient on the results of the workup.  I have prescribed Flagyl  as well as a small quantity of hydrocodone  after reviewing her record in the PDMP.  The patient has been on hydrocodone -acetaminophen  previously, although her last prescription for a 1 month supply was on 5/11.  At this time, the patient is stable for discharge home.  I gave strict return precautions, she expressed understanding.  She agrees to follow-up with her OB/GYN.   FINAL CLINICAL IMPRESSION(S) / ED DIAGNOSES   Final diagnoses:  Trichomoniasis     Rx / DC Orders   ED Discharge Orders          Ordered    metroNIDAZOLE  (FLAGYL ) 500 MG tablet  2 times daily        10/08/23 0606    HYDROcodone -acetaminophen  (NORCO) 7.5-325 MG tablet  Every 6 hours PRN        10/08/23 0607             Note:  This document was prepared using Dragon voice recognition software and may include unintentional dictation errors.    Jacolyn Pae, MD 10/08/23 [phone]

## 2023-10-10 ENCOUNTER — Other Ambulatory Visit: Payer: Self-pay

## 2023-10-10 ENCOUNTER — Emergency Department
Admission: EM | Admit: 2023-10-10 | Discharge: 2023-10-10 | Disposition: A | Discharge status: Home / Self Care | Attending: Emergency Medicine | Admitting: Emergency Medicine

## 2023-10-10 DIAGNOSIS — R102 Pelvic and perineal pain: Secondary | ICD-10-CM | POA: Diagnosis present

## 2023-10-10 DIAGNOSIS — A6000 Herpesviral infection of urogenital system, unspecified: Secondary | ICD-10-CM | POA: Diagnosis not present

## 2023-10-10 LAB — HIV ANTIBODY (ROUTINE TESTING W REFLEX): HIV Screen 4th Generation wRfx: NONREACTIVE

## 2023-10-10 MED ORDER — LIDOCAINE 5 % EX OINT
1.0000 | TOPICAL_OINTMENT | CUTANEOUS | 0 refills | Status: DC | PRN
Start: 2023-10-10 — End: 2023-11-16

## 2023-10-10 MED ORDER — OXYCODONE-ACETAMINOPHEN 5-325 MG PO TABS: 1.0000 | ORAL_TABLET | Freq: Four times a day (QID) | ORAL | 0 refills | Status: AC | PRN

## 2023-10-10 MED ORDER — VALACYCLOVIR HCL 1 G PO TABS: 1000.0000 mg | ORAL_TABLET | Freq: Two times a day (BID) | ORAL | 2 refills | Status: AC

## 2023-10-10 MED ORDER — OXYCODONE-ACETAMINOPHEN 5-325 MG PO TABS
1.0000 | ORAL_TABLET | Freq: Once | ORAL | Status: AC
  Administered 2023-10-10: 1 via ORAL
  Filled 2023-10-10: qty 1

## 2023-10-10 NOTE — ED Triage Notes (Signed)
 Pt here with vaginal pain, pt has trichomoniasis and a yeast infx currently. Pt endorses severe pain but denies bleeding. Pt states she pats herself d/t to the pain when touched.

## 2023-10-10 NOTE — ED Provider Notes (Signed)
 [facility] Provider Note    Event Date/Time   First MD Initiated Contact with Patient 10/10/23 1008     (approximate)   History   Vaginal Pain   HPI  Christina Warner is a 28 y.o. female history of chlamydia, recent trichomoniasis, among other chronic medical problems see past medical history presents emergency department complaining of vaginal pain.  Patient was seen here and diagnosed with trichomoniasis.  Started on Flagyl  and given Norco.  Patient is run out of her pain medication.  She was seen on 6/26.  Patient states that area is very painful.  Asked her if there are any blisters in this area which she states she has not looked.  Denies fever, chills      Physical Exam   Triage Vital Signs: ED Triage Vitals  Encounter Vitals Group     BP 10/10/23 0912 120/85     Girls Systolic BP Percentile --      Girls Diastolic BP Percentile --      Boys Systolic BP Percentile --      Boys Diastolic BP Percentile --      Pulse Rate 10/10/23 0912 (!) 109     Resp 10/10/23 0912 17     Temp 10/10/23 0913 97.9 F (36.6 C)     Temp Source 10/10/23 0913 Oral     SpO2 10/10/23 0912 99 %     Weight 10/10/23 0912 184 lb 1.4 oz (83.5 kg)     Height 10/10/23 0912 5' 1 (1.549 m)     Head Circumference --      Peak Flow --      Pain Score 10/10/23 0912 10     Pain Loc --      Pain Education --      Exclude from Growth Chart --     Most recent vital signs: Vitals:   10/10/23 0913 10/10/23 1135  BP:    Pulse:  85  Resp:  16  Temp: 97.9 F (36.6 C)   SpO2:  100%     General: Awake, no distress.   CV:  Good peripheral perfusion.  Resp:  Normal effort.  Abd:  No distention.   Other:  Pelvic exam shows ulcerated areas at the entrance to the vagina that are very tender to palpation   ED Results / Procedures / Treatments   Labs (all labs ordered are listed, but only abnormal results are displayed) Labs Reviewed  HSV CULTURE AND TYPING  HIV ANTIBODY  (ROUTINE TESTING W REFLEX)  HSV 2 ANTIBODY, IGG  RPR     EKG     RADIOLOGY     PROCEDURES:   Procedures  Critical Care:  no Chief Complaint  Patient presents with   Vaginal Pain      MEDICATIONS ORDERED IN ED: Medications  oxyCODONE -acetaminophen  (PERCOCET/ROXICET) 5-325 MG per tablet 1 tablet (1 tablet Oral Given 10/10/23 1132)     IMPRESSION / MDM / ASSESSMENT AND PLAN / ED COURSE  I reviewed the triage vital signs and the nursing notes.                              Differential diagnosis includes, but is not limited to, herpes, trichomoniasis, vaginitis, malingering  Patient's presentation is most consistent with acute illness / injury with system symptoms.    Medications given: Percocet 1 p.o.  HIV test negative, swabs for herpes along with blood  work ordered.  RPR test also ordered due to the patient recently being diagnosed with trichomoniasis.  She was given a prescription for valacyclovir 1000 twice daily for 10 days.  Lidocaine  ointment for the vaginal area if needed.  Percocet for pain.  She is to follow-up at the health department.  I had a long discussion with her and her boyfriend as they should use condoms when the rash clears up but she may still be able to transmit the disease.  Encouraged him to get tested as he may also be already tested positive.  Explained to both of them that you can harbor  this and have a breakout in several years.  Other instructions given related to herpes genitalis.  Patient is in agreement treatment plan.  She is discharged stable condition        FINAL CLINICAL IMPRESSION(S) / ED DIAGNOSES   Final diagnoses:  Genital herpes simplex, unspecified site     Rx / DC Orders   ED Discharge Orders          Ordered    valACYclovir (VALTREX) 1000 MG tablet  2 times daily        10/10/23 1118    lidocaine  (XYLOCAINE ) 5 % ointment  As needed        10/10/23 1118    oxyCODONE -acetaminophen  (PERCOCET) 5-325 MG tablet   Every 6 hours PRN        10/10/23 1118             Note:  This document was prepared using Dragon voice recognition software and may include unintentional dictation errors.    Gasper Devere ORN, PA-C 10/10/23 KAROLINE    Arlander Charleston, MD 10/10/23 [phone]

## 2023-10-10 NOTE — ED Notes (Signed)
 Patient states it hurts to wipe, discharge noted on underwear is white. Patient states she is compliant with taking prescribed medications.

## 2023-10-10 NOTE — Discharge Instructions (Addendum)
 Finish your medication for trichomoniasis Take the antiviral as prescribed.  I gave you refills to continue if you are unable to be seen at the health department in the next week due to the upcoming holiday. Lidocaine  ointment to the vaginal sores.  Use sparingly but as needed We did test you for HIV and syphilis.  You have lab work pending on the herpes cultures.  Someone will call you if these are positive, however you can still see these on your [facility] health MyChart. Return emergency department if you are worsening Do not bathe with your children in a tub. Use condoms when having sex.  At this time you will need to do pelvic rest as it would be entirely too painful to have sex. Have your partner evaluated at the [facility] department for herpes, syphilis etc.

## 2023-10-11 LAB — RPR: RPR Ser Ql: NONREACTIVE

## 2023-10-11 LAB — HSV 2 ANTIBODY, IGG: HSV 2 Glycoprotein G Ab, IgG: NONREACTIVE

## 2023-10-13 LAB — HSV CULTURE AND TYPING

## 2023-10-15 ENCOUNTER — Ambulatory Visit: Payer: Self-pay | Admitting: Emergency Medicine

## 2023-11-14 ENCOUNTER — Other Ambulatory Visit: Payer: Self-pay

## 2023-11-14 ENCOUNTER — Emergency Department
Admission: EM | Admit: 2023-11-14 | Discharge: 2023-11-14 | Disposition: A | Discharge status: Home / Self Care | Attending: Emergency Medicine | Admitting: Emergency Medicine

## 2023-11-14 DIAGNOSIS — R220 Localized swelling, mass and lump, head: Secondary | ICD-10-CM | POA: Diagnosis present

## 2023-11-14 DIAGNOSIS — J4540 Moderate persistent asthma, uncomplicated: Secondary | ICD-10-CM | POA: Insufficient documentation

## 2023-11-14 DIAGNOSIS — K029 Dental caries, unspecified: Secondary | ICD-10-CM | POA: Insufficient documentation

## 2023-11-14 DIAGNOSIS — Z720 Tobacco use: Secondary | ICD-10-CM | POA: Insufficient documentation

## 2023-11-14 DIAGNOSIS — K0889 Other specified disorders of teeth and supporting structures: Secondary | ICD-10-CM

## 2023-11-14 DIAGNOSIS — K0381 Cracked tooth: Secondary | ICD-10-CM | POA: Diagnosis not present

## 2023-11-14 LAB — CBC WITH DIFFERENTIAL/PLATELET
Abs Immature Granulocytes: 0.02 K/uL (ref 0.00–0.07)
Basophils Absolute: 0 K/uL (ref 0.0–0.1)
Basophils Relative: 0 %
Eosinophils Absolute: 0.3 K/uL (ref 0.0–0.5)
Eosinophils Relative: 4 %
HCT: 38.8 % (ref 36.0–46.0)
Hemoglobin: 13 g/dL (ref 12.0–15.0)
Immature Granulocytes: 0 %
Lymphocytes Relative: 39 %
Lymphs Abs: 3 K/uL (ref 0.7–4.0)
MCH: 30.9 pg (ref 26.0–34.0)
MCHC: 33.5 g/dL (ref 30.0–36.0)
MCV: 92.2 fL (ref 80.0–100.0)
Monocytes Absolute: 0.4 K/uL (ref 0.1–1.0)
Monocytes Relative: 5 %
Neutro Abs: 3.9 K/uL (ref 1.7–7.7)
Neutrophils Relative %: 52 %
Platelets: 242 K/uL (ref 150–400)
RBC: 4.21 MIL/uL (ref 3.87–5.11)
RDW: 12.6 % (ref 11.5–15.5)
WBC: 7.6 K/uL (ref 4.0–10.5)
nRBC: 0 % (ref 0.0–0.2)

## 2023-11-14 LAB — BASIC METABOLIC PANEL WITH GFR
Anion gap: 8 (ref 5–15)
BUN: 8 mg/dL (ref 6–20)
CO2: 26 mmol/L (ref 22–32)
Calcium: 9.5 mg/dL (ref 8.9–10.3)
Chloride: 109 mmol/L (ref 98–111)
Creatinine, Ser: 1.06 mg/dL — ABNORMAL HIGH (ref 0.44–1.00)
GFR, Estimated: >60 mL/min (ref >60)
Glucose, Bld: 88 mg/dL (ref 70–99)
Potassium: 3.9 mmol/L (ref 3.5–5.1)
Sodium: 143 mmol/L (ref 135–145)

## 2023-11-14 MED ORDER — AMOXICILLIN-POT CLAVULANATE 875-125 MG PO TABS: 1.0000 | ORAL_TABLET | Freq: Two times a day (BID) | ORAL | 0 refills | Status: DC

## 2023-11-14 MED ORDER — KETOROLAC TROMETHAMINE 15 MG/ML IJ SOLN
15.0000 mg | Freq: Once | INTRAMUSCULAR | Status: AC
Start: 2023-11-14 — End: 2023-11-14
  Administered 2023-11-14: 15 mg via INTRAMUSCULAR
  Filled 2023-11-14: qty 1

## 2023-11-14 MED ORDER — CHLORHEXIDINE GLUCONATE 0.12 % MT SOLN: 15.0000 mL | Freq: Two times a day (BID) | OROMUCOSAL | 0 refills | Status: AC

## 2023-11-14 NOTE — ED Triage Notes (Signed)
 Pt to ED for swelling to R upper gumline since last week. States R face feels swollen. Does not look swollen. States hx of same and her face became very swollen. Pt has noticeable caries on teeth that have not been repaired.

## 2023-11-14 NOTE — ED Provider Notes (Signed)
 [facility] Provider Note    Event Date/Time   First MD Initiated Contact with Patient 11/14/23 1306     (approximate)   History   Abscess and Dental Pain   HPI  Christina Warner is a 28 y.o. female who presents today for evaluation of dental pain.  Patient reports that her symptoms began 1 week ago to her right upper teeth.  She reports the last time this happened she developed facial swelling and she is concerned that this might happen this time.  She has not had any voice change or difficulty swallowing.  She has not had any difficulty breathing.  No drooling.  No neck pain.  Patient Active Problem List   Diagnosis Date Noted   Pneumonia due to Mycoplasma pneumoniae 05/10/2023   Acute bronchitis 05/08/2023   Acute respiratory failure with hypoxia (HCC) 05/08/2023   Moderate persistent asthma with exacerbation 05/07/2023   Asthma 05/07/2023   Major depressive disorder, recurrent episode, moderate with anxious distress (HCC) 10/30/2022   Generalized anxiety disorder 10/30/2022   PTSD (post-traumatic stress disorder) 10/30/2022   Tobacco use disorder 10/30/2022   Uterine contractions 03/02/2021   NSVD (normal spontaneous vaginal delivery) 03/02/2021   Attention deficit hyperactivity disorder (ADHD) 05/22/2011          Physical Exam   Triage Vital Signs: ED Triage Vitals  Encounter Vitals Group     BP 11/14/23 1247 132/86     Girls Systolic BP Percentile --      Girls Diastolic BP Percentile --      Boys Systolic BP Percentile --      Boys Diastolic BP Percentile --      Pulse Rate 11/14/23 1247 97     Resp 11/14/23 1247 18     Temp 11/14/23 1247 98 F (36.7 C)     Temp Source 11/14/23 1247 Oral     SpO2 11/14/23 1247 100 %     Weight 11/14/23 1246 184 lb (83.5 kg)     Height 11/14/23 1246 5' 1 (1.549 m)     Head Circumference --      Peak Flow --      Pain Score 11/14/23 1245 7     Pain Loc --      Pain Education --      Exclude from  Growth Chart --     Most recent vital signs: Vitals:   11/14/23 1247 11/14/23 1442  BP: 132/86   Pulse: 97   Resp: 18 18  Temp: 98 F (36.7 C)   SpO2: 100%     Physical Exam Vitals and nursing note reviewed.  Constitutional:      General: Awake and alert. No acute distress.    Appearance: Normal appearance. The patient is normal weight.  HENT:     Head: Normocephalic and atraumatic.     Mouth: Mucous membranes are moist.  Poor dentition diffusely.  Tooth #5 and 6 are fractured and decayed.  There is tenderness to the teeth and along the gumline, no fluctuance noted.  No sublingual swelling.  No facial or neck swelling or erythema.  No drooling or voice change. Eyes:     General: PERRL. Normal EOMs        Right eye: No discharge.        Left eye: No discharge.     Conjunctiva/sclera: Conjunctivae normal.  Cardiovascular:     Rate and Rhythm: Normal rate and regular rhythm.     Pulses:  Normal pulses.  Pulmonary:     Effort: Pulmonary effort is normal. No respiratory distress.     Breath sounds: Normal breath sounds.  Abdominal:     Abdomen is soft. There is no abdominal tenderness. No rebound or guarding. No distention. Musculoskeletal:        General: No swelling. Normal range of motion.     Cervical back: Normal range of motion and neck supple.  Skin:    General: Skin is warm and dry.     Capillary Refill: Capillary refill takes less than 2 seconds.     Findings: No rash.  Neurological:     Mental Status: The patient is awake and alert.      ED Results / Procedures / Treatments   Labs (all labs ordered are listed, but only abnormal results are displayed) Labs Reviewed  BASIC METABOLIC PANEL WITH GFR - Abnormal; Notable for the following components:      Result Value   Creatinine, Ser 1.06 (*)    All other components within normal limits  CBC WITH DIFFERENTIAL/PLATELET     EKG     RADIOLOGY     PROCEDURES:  Critical Care performed:    Procedures   MEDICATIONS ORDERED IN ED: Medications  ketorolac  (TORADOL ) 15 MG/ML injection 15 mg (15 mg Intramuscular Given 11/14/23 1351)     IMPRESSION / MDM / ASSESSMENT AND PLAN / ED COURSE  I reviewed the triage vital signs and the nursing notes.   Differential diagnosis includes, but is not limited to, dental caries, dental decay, dental fracture, pulpitis, gingival abscess.  Patient is awake and alert, hemodynamically stable and afebrile.  She is nontoxic in appearance.  Patient was evaluated in the emergency department for dental pain. Patient has tenderness over 1 of her teeth and poor dentition, I suspect some dental caries vs pulpitits. No gingival swelling or fluctuance concerning for gingival abscess.  No trismus, nuchal rigidity, neck pain, hot potato voice, uvular deviation or malocclusion to suggest deep space infection. No sublingual swelling concerning for Ludwig's angina.  Patient was started on antibiotics and chlorhexidine  mouth rinse.  Patient was treated symptomatically in the emergency department. Discussed care plan, return precautions, and advised close outpatient follow-up with dentist. Patient agrees with plan of care.   Patient's presentation is most consistent with acute, uncomplicated illness.       FINAL CLINICAL IMPRESSION(S) / ED DIAGNOSES   Final diagnoses:  Pain, dental     Rx / DC Orders   ED Discharge Orders          Ordered    amoxicillin -clavulanate (AUGMENTIN ) 875-125 MG tablet  2 times daily        11/14/23 1354    chlorhexidine  (PERIDEX ) 0.12 % solution  2 times daily        11/14/23 1354             Note:  This document was prepared using Dragon voice recognition software and may include unintentional dictation errors.   Shauntia Levengood E, PA-C 11/14/23 1444    Suzanne Kirsch, MD 11/14/23 [phone]

## 2023-11-14 NOTE — Discharge Instructions (Signed)
 Take the antibiotics as prescribed. Please follow up with your dentist. Return for any new, worsening, or change in symptoms or other concerns.

## 2023-11-16 ENCOUNTER — Other Ambulatory Visit: Payer: Self-pay

## 2023-11-16 ENCOUNTER — Encounter: Payer: Self-pay | Admitting: Emergency Medicine

## 2023-11-16 ENCOUNTER — Emergency Department: Admission: EM | Admit: 2023-11-16 | Discharge: 2023-11-16 | Disposition: A | Discharge status: Home / Self Care

## 2023-11-16 DIAGNOSIS — K0889 Other specified disorders of teeth and supporting structures: Secondary | ICD-10-CM | POA: Diagnosis present

## 2023-11-16 DIAGNOSIS — K047 Periapical abscess without sinus: Secondary | ICD-10-CM | POA: Insufficient documentation

## 2023-11-16 DIAGNOSIS — J45909 Unspecified asthma, uncomplicated: Secondary | ICD-10-CM | POA: Insufficient documentation

## 2023-11-16 MED ORDER — CLINDAMYCIN HCL 150 MG PO CAPS: 300.0000 mg | ORAL_CAPSULE | Freq: Three times a day (TID) | ORAL | 0 refills | Status: AC

## 2023-11-16 NOTE — ED Provider Notes (Signed)
 [facility] Provider Note    Event Date/Time   First MD Initiated Contact with Patient 11/16/23 1724     (approximate)   History   Dental Pain   HPI  Christina Warner is a 28 y.o. female history of asthma, other psychiatric problems presents to the emergency department stating that the Augmentin  she was given 2 days ago is making her nauseated.  Would like to switch the antibiotic for her dental pain.  Has not seen the dentist.  States called they could not fit her in.  Denies fever chills      Physical Exam   Triage Vital Signs: ED Triage Vitals [11/16/23 1539]  Encounter Vitals Group     BP (!) 136/103     Girls Systolic BP Percentile      Girls Diastolic BP Percentile      Boys Systolic BP Percentile      Boys Diastolic BP Percentile      Pulse Rate 86     Resp 18     Temp 98.7 F (37.1 C)     Temp Source Oral     SpO2 100 %     Weight 195 lb (88.5 kg)     Height 5' 1 (1.549 m)     Head Circumference      Peak Flow      Pain Score 7     Pain Loc      Pain Education      Exclude from Growth Chart     Most recent vital signs: Vitals:   11/16/23 1539  BP: (!) 136/103  Pulse: 86  Resp: 18  Temp: 98.7 F (37.1 C)  SpO2: 100%     General: Awake, no distress.   CV:  Good peripheral perfusion.  Resp:  Normal effort.  Abd:  No distention.   Other:  Poor dentition noted   ED Results / Procedures / Treatments   Labs (all labs ordered are listed, but only abnormal results are displayed) Labs Reviewed - No data to display   EKG     RADIOLOGY     PROCEDURES:   Procedures  Critical Care:  no Chief Complaint  Patient presents with   Dental Pain      MEDICATIONS ORDERED IN ED: Medications - No data to display   IMPRESSION / MDM / ASSESSMENT AND PLAN / ED COURSE  I reviewed the triage vital signs and the nursing notes.                              Differential diagnosis includes, but is not limited to,  dental pain, dental infection, dental caries, trench mouth  Patient's presentation is most consistent with acute illness / injury with system symptoms.   I instructed patient to stop the Augmentin .  Will switch her to clindamycin  which she does tolerate better.  Follow-up with her dental clinic.  Also once again gave her a list of dental clinics for her to follow-up with.  She is in agreement treatment plan.  Discharged stable condition.      FINAL CLINICAL IMPRESSION(S) / ED DIAGNOSES   Final diagnoses:  Pain, dental  Dental infection     Rx / DC Orders   ED Discharge Orders          Ordered    clindamycin  (CLEOCIN ) 150 MG capsule  3 times daily  11/16/23 1729             Note:  This document was prepared using Dragon voice recognition software and may include unintentional dictation errors.    Gasper Devere ORN, PA-C 11/16/23 1740    Clarine Ozell LABOR, MD 11/17/23 [phone]

## 2023-11-16 NOTE — ED Triage Notes (Signed)
 Patient to ED via POV for abscess on upper mouth. Ongoing x4 days. Unable to get in with dentist. Seen for same on 8/2.

## 2023-11-16 NOTE — Discharge Instructions (Signed)
 OPTIONS FOR DENTAL FOLLOW UP CARE  [place] Department of Health and Human Services - Local Safety Net Dental Clinics [email or web address]   [facility] [phone])  Sharl Ma [phone])  [place] [phone])  [facility] [phone])  [facility] [phone]) This clinic caters to the indigent population and is on a lottery system. Location: [employer] of Dentistry, [employer], [address], [place] Clinic Hours: Wednesdays from 6pm - 9pm, patients seen by a lottery system. For dates, call or go to [email or web address] Services: Cleanings, fillings and simple extractions. Payment Options: DENTAL WORK IS FREE OF CHARGE. Bring proof of income or support. Best way to get seen: Arrive at 5:15 pm - this is a lottery, NOT first come/first serve, so arriving earlier will not increase your chances of being seen.     [facility] [phone] Select option 1 for emergencies   Location: [facility]istry, [place], [address], [place] Clinic Hours: No walk-ins accepted - call the day before to schedule an appointment. Check in times are 9:30 am and 1:30 pm. Services: Simple extractions, temporary fillings, pulpectomy/pulp debridement, uncomplicated abscess drainage. Payment Options: PAYMENT IS DUE AT THE TIME OF SERVICE.  Fee is usually $100-200, additional surgical procedures (e.g. abscess drainage) may be extra. Cash, checks, Visa/MasterCard accepted.  Can file Medicaid if patient is covered for dental - patient should call case worker to check. No discount for [facility] patients. Best way to get seen: MUST call the day before and get onto the schedule. Can usually be seen the next 1-2 days. No walk-ins accepted.     [facility] [phone]    Location: [facility], [address], [place] Clinic Hours: M, W, Th, F 8am or 1:30pm, Tues 9a or 1:30 - first come/first served. Services: Simple extractions, temporary fillings, uncomplicated abscess drainage.  You do not need to be an [facility] resident. Payment Options: PAYMENT IS DUE AT THE TIME OF SERVICE. Dental insurance, otherwise sliding scale - bring proof of income or support. Depending on income and treatment needed, cost is usually $50-200. Best way to get seen: Arrive early as it is first come/first served.     [facility] [phone]   Location: [address] Clinic Hours: Mon-Thu 8a-5p Services: Most basic dental services including extractions and fillings. Payment Options: PAYMENT IS DUE AT THE TIME OF SERVICE. Sliding scale, up to 50% off - bring proof if income or support. Medicaid with dental option accepted. Best way to get seen: Call to schedule an appointment, can usually be seen within 2 weeks OR they will try to see walk-ins - show up at 8a or 2p (you may have to wait).     [facility] [phone] [place] RESIDENTS ONLY   Location: [facility], [address], [place], [place] [address] Clinic Hours: By appointment only. Monday - Thursday 8am-5pm, Friday 8am-12pm Services: Cleanings, fillings, extractions. Payment Options: PAYMENT IS DUE AT THE TIME OF SERVICE. Cash, Visa or MasterCard. Sliding scale - $30 minimum per service. Best way to get seen: Come in to office, complete packet and make an appointment - need proof of income or support monies for each household member and proof of [facility] residence. Usually takes about a month to get in.     [facility] [phone]   Location: [address]  [place] Clinic Hours: Walk-in Urgent Care Dental Services are offered Monday-Friday  mornings only. The numbers of emergencies accepted daily is limited to the number of providers available. Maximum 15 - Mondays, Wednesdays & Thursdays Maximum 10 - Tuesdays & Fridays Services: You do not need to be a [facility] resident to be seen for a dental emergency. Emergencies are defined as pain, swelling, abnormal bleeding, or dental trauma. Walkins will receive x-rays if needed. NOTE: Dental cleaning is not an emergency. Payment Options: PAYMENT IS DUE AT THE TIME OF SERVICE. Minimum co-pay is $40.00 for uninsured patients. Minimum co-pay is $3.00 for Medicaid with dental coverage. Dental Insurance is accepted and must be presented at time of visit. Medicare does not cover dental. Forms of payment: Cash, credit card, checks. Best way to get seen: If not previously registered with the clinic, walk-in dental registration begins at 7:15 am and is on a first come/first serve basis. If previously registered with the clinic, call to make an appointment.     [facility] [phone] [place] RESIDENTS ONLY   Location: [address] [place], [place] Clinic Hours: Mon-Thu 10a-2p Services: Extractions only! Payment Options: FREE (donations accepted) - bring proof of income or support Best way to get seen: Call and schedule an appointment OR come at 8am on the 1st Monday of every month (except for holidays) when it is first come/first served.     [facility] [phone]   Location: [address] [place], [place] Clinic Hours: Friday mornings Services, Payment Options, Best way to get seen: Call for info

## 2023-12-07 ENCOUNTER — Ambulatory Visit (INDEPENDENT_AMBULATORY_CARE_PROVIDER_SITE_OTHER): Admitting: Physician Assistant

## 2023-12-07 VITALS — BP 154/111 | HR 88 | Ht 61.0 in | Wt 184.0 lb

## 2023-12-07 DIAGNOSIS — F331 Major depressive disorder, recurrent, moderate: Secondary | ICD-10-CM | POA: Diagnosis not present

## 2023-12-07 DIAGNOSIS — F4310 Post-traumatic stress disorder, unspecified: Secondary | ICD-10-CM

## 2023-12-07 DIAGNOSIS — F411 Generalized anxiety disorder: Secondary | ICD-10-CM | POA: Diagnosis not present

## 2023-12-07 MED ORDER — ARIPIPRAZOLE 5 MG PO TABS: 5.0000 mg | ORAL_TABLET | Freq: Every day | ORAL | 2 refills | Status: DC

## 2023-12-07 MED ORDER — TRAZODONE HCL 100 MG PO TABS
100.0000 mg | ORAL_TABLET | Freq: Every day | ORAL | 2 refills | Status: DC
Start: 2023-12-07 — End: 2024-01-19

## 2023-12-07 MED ORDER — BUSPIRONE HCL 7.5 MG PO TABS: 7.5000 mg | ORAL_TABLET | Freq: Two times a day (BID) | ORAL | 2 refills | Status: DC

## 2023-12-07 MED ORDER — FLUOXETINE HCL 20 MG PO CAPS
20.0000 mg | ORAL_CAPSULE | Freq: Every day | ORAL | 2 refills | Status: DC
Start: 2023-12-07 — End: 2024-01-19
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
## 2023-12-09 ENCOUNTER — Encounter (HOSPITAL_COMMUNITY): Payer: Self-pay | Admitting: Physician Assistant
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
## 2024-01-19 ENCOUNTER — Ambulatory Visit (INDEPENDENT_AMBULATORY_CARE_PROVIDER_SITE_OTHER)

## 2024-01-19 DIAGNOSIS — F331 Major depressive disorder, recurrent, moderate: Secondary | ICD-10-CM

## 2024-01-19 DIAGNOSIS — F4310 Post-traumatic stress disorder, unspecified: Secondary | ICD-10-CM | POA: Diagnosis not present

## 2024-01-19 DIAGNOSIS — F411 Generalized anxiety disorder: Secondary | ICD-10-CM | POA: Diagnosis not present

## 2024-01-19 MED ORDER — TRAZODONE HCL 100 MG PO TABS: 100.0000 mg | ORAL_TABLET | Freq: Every day | ORAL | 2 refills | Status: AC

## 2024-01-19 MED ORDER — FLUOXETINE HCL 20 MG PO CAPS: 20.0000 mg | ORAL_CAPSULE | Freq: Every day | ORAL | 2 refills | Status: AC

## 2024-01-19 MED ORDER — BUSPIRONE HCL 7.5 MG PO TABS: 7.5000 mg | ORAL_TABLET | Freq: Two times a day (BID) | ORAL | 2 refills | Status: AC

## 2024-01-19 MED ORDER — ARIPIPRAZOLE 10 MG PO TABS: 10.0000 mg | ORAL_TABLET | Freq: Every day | ORAL | 2 refills | Status: AC

## 2024-01-22 ENCOUNTER — Telehealth (HOSPITAL_COMMUNITY): Payer: Self-pay

## 2024-01-22 NOTE — Telephone Encounter (Signed)
 went online and submitted the prior auth- pending.

## 2024-01-22 NOTE — Telephone Encounter (Signed)
 received fax from [place] that a prior auth is needed on the aripiprazole  10mg .

## 2024-01-25 NOTE — Telephone Encounter (Signed)
 received notice from [employer] from [email or web address] that the prior auth had been approved until 01-21-25 case # [id number]

## 2024-02-01 ENCOUNTER — Telehealth (HOSPITAL_COMMUNITY): Payer: Self-pay

## 2024-02-01 NOTE — Telephone Encounter (Signed)
 Patient stopped by the office and said she wanted to speak with someone about her medication. I offered to see if there was a nurse available. She said she didn't have time to speak with anyone. I offered to take a message and she agreed but ran out because she said she was in a hurry.
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
## 2024-03-15 ENCOUNTER — Encounter (HOSPITAL_COMMUNITY)
